# Patient Record
Sex: Female | Born: 1955
Health system: Southern US, Community
[De-identification: ages and names within clinical notes are randomized; demographics above are authoritative.]

## PROBLEM LIST (undated history)

## (undated) DIAGNOSIS — D649 Anemia, unspecified: Secondary | ICD-10-CM

## (undated) DIAGNOSIS — R51 Headache: Secondary | ICD-10-CM

## (undated) DIAGNOSIS — Z87442 Personal history of urinary calculi: Secondary | ICD-10-CM

## (undated) DIAGNOSIS — Z9889 Other specified postprocedural states: Secondary | ICD-10-CM

## (undated) DIAGNOSIS — J45909 Unspecified asthma, uncomplicated: Secondary | ICD-10-CM

## (undated) DIAGNOSIS — M1990 Unspecified osteoarthritis, unspecified site: Secondary | ICD-10-CM

## (undated) DIAGNOSIS — E039 Hypothyroidism, unspecified: Secondary | ICD-10-CM

## (undated) DIAGNOSIS — J189 Pneumonia, unspecified organism: Secondary | ICD-10-CM

## (undated) DIAGNOSIS — R112 Nausea with vomiting, unspecified: Secondary | ICD-10-CM

## (undated) DIAGNOSIS — R635 Abnormal weight gain: Secondary | ICD-10-CM

## (undated) HISTORY — DX: Hypothyroidism, unspecified: E03.9

## (undated) HISTORY — PX: ABDOMINAL HYSTERECTOMY: SHX81

## (undated) HISTORY — PX: TONSILLECTOMY: SHX5217

## (undated) HISTORY — DX: Abnormal weight gain: R63.5

## (undated) HISTORY — PX: DIAGNOSTIC LAPAROSCOPY: SUR761

## (undated) HISTORY — PX: APPENDECTOMY: SHX54

## (undated) HISTORY — PX: TYMPANOSTOMY TUBE PLACEMENT: SHX32

## (undated) HISTORY — PX: KNEE ARTHROSCOPY: SUR90

## (undated) HISTORY — PX: TUBAL LIGATION: SHX77

## (undated) HISTORY — PX: OTHER SURGICAL HISTORY: SHX169

---

## 2000-04-05 ENCOUNTER — Encounter: Admission: RE | Admit: 2000-04-05 | Discharge: 2000-04-05 | Payer: Self-pay | Admitting: Internal Medicine

## 2000-04-05 ENCOUNTER — Encounter: Payer: Self-pay | Admitting: Internal Medicine

## 2005-01-22 ENCOUNTER — Ambulatory Visit: Payer: Self-pay | Admitting: Internal Medicine

## 2005-11-12 HISTORY — PX: FOOT SURGERY: SHX648

## 2006-03-29 ENCOUNTER — Ambulatory Visit: Payer: Self-pay | Admitting: Internal Medicine

## 2006-04-19 ENCOUNTER — Ambulatory Visit (HOSPITAL_BASED_OUTPATIENT_CLINIC_OR_DEPARTMENT_OTHER): Admission: RE | Admit: 2006-04-19 | Discharge: 2006-04-19 | Payer: Self-pay | Admitting: Urology

## 2006-07-22 ENCOUNTER — Ambulatory Visit: Payer: Self-pay | Admitting: Internal Medicine

## 2007-02-11 ENCOUNTER — Ambulatory Visit: Payer: Self-pay | Admitting: Internal Medicine

## 2007-05-06 ENCOUNTER — Ambulatory Visit: Payer: Self-pay | Admitting: Internal Medicine

## 2007-05-27 DIAGNOSIS — E039 Hypothyroidism, unspecified: Secondary | ICD-10-CM | POA: Insufficient documentation

## 2007-05-27 HISTORY — DX: Hypothyroidism, unspecified: E03.9

## 2007-08-14 ENCOUNTER — Telehealth: Payer: Self-pay | Admitting: Internal Medicine

## 2007-11-05 ENCOUNTER — Ambulatory Visit (HOSPITAL_COMMUNITY): Admission: RE | Admit: 2007-11-05 | Discharge: 2007-11-05 | Payer: Self-pay | Admitting: Orthopedic Surgery

## 2007-11-05 ENCOUNTER — Encounter (INDEPENDENT_AMBULATORY_CARE_PROVIDER_SITE_OTHER): Payer: Self-pay | Admitting: Orthopedic Surgery

## 2008-01-07 ENCOUNTER — Telehealth: Payer: Self-pay | Admitting: Internal Medicine

## 2008-11-03 ENCOUNTER — Ambulatory Visit: Payer: Self-pay | Admitting: Family Medicine

## 2008-11-03 ENCOUNTER — Telehealth (INDEPENDENT_AMBULATORY_CARE_PROVIDER_SITE_OTHER): Payer: Self-pay | Admitting: *Deleted

## 2008-11-03 DIAGNOSIS — H6690 Otitis media, unspecified, unspecified ear: Secondary | ICD-10-CM | POA: Insufficient documentation

## 2008-11-16 ENCOUNTER — Ambulatory Visit: Payer: Self-pay | Admitting: Internal Medicine

## 2009-07-01 ENCOUNTER — Telehealth (INDEPENDENT_AMBULATORY_CARE_PROVIDER_SITE_OTHER): Payer: Self-pay

## 2009-07-04 ENCOUNTER — Telehealth: Payer: Self-pay | Admitting: Internal Medicine

## 2009-07-13 ENCOUNTER — Encounter: Payer: Self-pay | Admitting: Internal Medicine

## 2009-07-14 ENCOUNTER — Encounter: Payer: Self-pay | Admitting: Internal Medicine

## 2009-07-15 ENCOUNTER — Ambulatory Visit: Payer: Self-pay | Admitting: Internal Medicine

## 2009-07-15 DIAGNOSIS — R635 Abnormal weight gain: Secondary | ICD-10-CM | POA: Insufficient documentation

## 2009-07-15 HISTORY — DX: Abnormal weight gain: R63.5

## 2009-08-11 ENCOUNTER — Ambulatory Visit: Payer: Self-pay | Admitting: Internal Medicine

## 2009-08-11 DIAGNOSIS — J069 Acute upper respiratory infection, unspecified: Secondary | ICD-10-CM | POA: Insufficient documentation

## 2009-11-29 ENCOUNTER — Ambulatory Visit: Payer: Self-pay | Admitting: Internal Medicine

## 2009-11-29 LAB — CONVERTED CEMR LAB: Rapid Strep: NEGATIVE

## 2010-01-23 ENCOUNTER — Telehealth: Payer: Self-pay | Admitting: Internal Medicine

## 2010-01-24 ENCOUNTER — Ambulatory Visit: Payer: Self-pay | Admitting: Internal Medicine

## 2010-02-09 ENCOUNTER — Ambulatory Visit: Payer: Self-pay | Admitting: Internal Medicine

## 2010-07-12 ENCOUNTER — Ambulatory Visit: Payer: Self-pay | Admitting: Internal Medicine

## 2010-07-14 ENCOUNTER — Telehealth: Payer: Self-pay | Admitting: Internal Medicine

## 2010-10-26 ENCOUNTER — Telehealth: Payer: Self-pay | Admitting: Internal Medicine

## 2010-12-12 NOTE — Assessment & Plan Note (Signed)
Summary: renew meds  per phone note//ccm   Vital Signs:  Patient profile:   55 year old female Weight:      153 pounds Temp:     98.8 degrees F oral BP sitting:   132 / 72  (right arm) Cuff size:   regular  Vitals Entered By: Duard Brady LPN (January 24, 2010 4:07 PM) CC: medication review Is Patient Diabetic? No   CC:  medication review.  History of Present Illness: 55 year old patient who is seen today for follow-up of her hypertension and hypothyroidism.  Her Synthroid dose was adjusted last fall due to slightly elevated TSH.  She has been on chronic phentermine for a number of years and a recent medication refill request was denied.  The risks and benefits of long-term phentermine use.  Discussed at length.  She does give her some frequent drug holidays.  She is aware that this medication is not designed for chronic use but feels that it does offer her considerable benefit with Weight maintenance.  Preventive Screening-Counseling & Management  Alcohol-Tobacco     Smoking Status: never  Allergies: 1)  ! Asa 2)  ! Pcn 3)  ! Sulfa 4)  ! Erythromycin 5)  ! * Latex 6)  ! Cipro 7)  ! * Fish  Past History:  Past Medical History: Reviewed history from 05/27/2007 and no changes required. DJD Hypothyroidism  Physical Exam  General:  Well-developed,well-nourished,in no acute distress; alert,appropriate and cooperative throughout examination; blood pressure well controlled Ears:  External ear exam shows no significant lesions or deformities.  Otoscopic examination reveals clear canals, tympanic membranes are intact bilaterally without bulging, retraction, inflammation or discharge. Hearing is grossly normal bilaterally. modest amount of cerumen in both canals Mouth:  Oral mucosa and oropharynx without lesions or exudates.  Teeth in good repair. Neck:  No deformities, masses, or tenderness noted. Lungs:  Normal respiratory effort, chest expands symmetrically. Lungs are  clear to auscultation, no crackles or wheezes. Heart:  Normal rate and regular rhythm. S1 and S2 normal without gallop, murmur, click, rub or other extra sounds. Abdomen:  Bowel sounds positive,abdomen soft and non-tender without masses, organomegaly or hernias noted. Msk:  No deformity or scoliosis noted of thoracic or lumbar spine.     Impression & Recommendations:  Problem # 1:  WEIGHT GAIN (ICD-783.1)  Problem # 2:  HYPOTHYROIDISM (ICD-244.9)  Her updated medication list for this problem includes:    Synthroid 150 Mcg Tabs (Levothyroxine sodium) ..... One daily  Complete Medication List: 1)  Phentermine Hcl 30 Mg Caps (Phentermine hcl) .Marland Kitchen.. 1 once daily 2)  Cortisporin 3.5-10000-1 Soln (Neomycin-polymyxin-hc) .... 5 drops in ear 4 times a day as needed 3)  Atenolol 25 Mg Tabs (Atenolol) .Marland Kitchen.. 1 once daily as needed headache 4)  Synthroid 150 Mcg Tabs (Levothyroxine sodium) .... One daily 5)  Hydrocodone-acetaminophen 5-500 Mg Tabs (Hydrocodone-acetaminophen) .... One every 4 to 6 hours as needed for pain or cough 6)  Cephalexin 500 Mg Caps (Cephalexin) .... One capsule 3 times daily  Patient Instructions: 1)  Please schedule a follow-up appointment in 6 months. 2)  It is important that you exercise regularly at least 20 minutes 5 times a week. If you develop chest pain, have severe difficulty breathing, or feel very tired , stop exercising immediately and seek medical attention. Prescriptions: SYNTHROID 150 MCG TABS (LEVOTHYROXINE SODIUM) one daily  #90 x 6   Entered and Authorized by:   Gordy Savers  MD   Signed by:  Gordy Savers  MD on 01/24/2010   Method used:   Print then Give to Patient   RxID:   [id number] ATENOLOL 25 MG TABS (ATENOLOL) 1 once daily as needed headache  #90 x 6   Entered and Authorized by:   Gordy Savers  MD   Signed by:   Gordy Savers  MD on 01/24/2010   Method used:   Print then Give to Patient   RxID:    [id number] PHENTERMINE HCL 30 MG  CAPS (PHENTERMINE HCL) 1 once daily  #90 x 3   Entered and Authorized by:   Gordy Savers  MD   Signed by:   Gordy Savers  MD on 01/24/2010   Method used:   Print then Give to Patient   RxID:   [id number]

## 2010-12-12 NOTE — Progress Notes (Signed)
Summary: requests keflex  Phone Note Call from Patient Call back at [phone]   Summary of Call: In chest.  Thick green mucus being coughed up from chest.  Low grade fever, goes up night 103-104.  Taking liquids.   Going out of town.  Requests Rx Keflex, one of the antibiotics she's not allergic to.  [place] [phone].  Allergic to Erythromycin, Penicillin, Cipro, can't remember others.  Let her know.    Initial call taken by: Rudy Jew, RN,  July 14, 2010 9:09 AM  Follow-up for Phone Call        keflex 500a  #30 one three times a day  Follow-up by: Gordy Savers  MD,  July 14, 2010 10:04 AM  Additional Follow-up for Phone Call Additional follow up Details #1::        Phone Call Completed Additional Follow-up by: Rudy Jew, RN,  July 14, 2010 10:27 AM    New/Updated Medications: KEFLEX 500 MG CAPS (CEPHALEXIN) One tid Prescriptions: KEFLEX 500 MG CAPS (CEPHALEXIN) One tid  #30 x 0   Entered by:   Rudy Jew, RN   Authorized by:   Gordy Savers  MD   Signed by:   Rudy Jew, RN on 07/14/2010   Method used:   [facility]        [facility] (retail[facility]       [address]       [place], [place]  [address]       Ph: [phone] or [phone]       Fax: [phone]   RxID:   [id number]

## 2010-12-12 NOTE — Assessment & Plan Note (Signed)
Summary: sore throat/ear pain//ccm   Vital Signs:  Patient profile:   55 year old female Weight:      150 pounds Temp:     99.2 degrees F oral BP sitting:   124 / 80  (right arm) Cuff size:   regular  Vitals Entered By: Duard Brady LPN (July 12, 2010 11:16 AM) CC: c/o head and chest congestion , productive cough , (L) ear pain Is Patient Diabetic? No   CC:  c/o head and chest congestion , productive cough , and (L) ear pain.  History of Present Illness: 55 year old patient who presents with a two-day history of head and chest congestion.  He does have low-grade fever sore throat and some mild left ear discomfort.  She has treated hypothyroidism.  Denies any chills.  She does have a prior history of otitis media.  Cough is minimally productive.  Her husband has had a similar illness and is improving  Allergies: 1)  ! Asa 2)  ! Pcn 3)  ! Sulfa 4)  ! Erythromycin 5)  ! * Latex 6)  ! Cipro 7)  ! * Fish  Past History:  Past Medical History: Reviewed history from 05/27/2007 and no changes required. DJD Hypothyroidism  Review of Systems       The patient complains of anorexia, fever, hoarseness, and prolonged cough.  The patient denies weight loss, weight gain, vision loss, decreased hearing, chest pain, syncope, dyspnea on exertion, peripheral edema, headaches, hemoptysis, abdominal pain, melena, hematochezia, severe indigestion/heartburn, hematuria, incontinence, genital sores, muscle weakness, suspicious skin lesions, transient blindness, difficulty walking, depression, unusual weight change, abnormal bleeding, enlarged lymph nodes, angioedema, and breast masses.    Physical Exam  General:  Well-developed,well-nourished,in no acute distress; alert,appropriate and cooperative throughout examination Head:  Normocephalic and atraumatic without obvious abnormalities. No apparent alopecia or balding. Eyes:  No corneal or conjunctival inflammation noted. EOMI. Perrla.  Funduscopic exam benign, without hemorrhages, exudates or papilledema. Vision grossly normal. Ears:  External ear exam shows no significant lesions or deformities.  Otoscopic examination reveals clear canals, tympanic membranes are intact bilaterally without bulging, retraction, inflammation or discharge. Hearing is grossly normal bilaterally. right tympanic membrane not visualized secondary to cerumen Nose:  External nasal examination shows no deformity or inflammation. Nasal mucosa are pink and moist without lesions or exudates. Mouth:  pharyngeal erythema.   Neck:  slightly tender right anterior cervical node Lungs:  Normal respiratory effort, chest expands symmetrically. Lungs are clear to auscultation, no crackles or wheezes. Heart:  Normal rate and regular rhythm. S1 and S2 normal without gallop, murmur, click, rub or other extra sounds.   Impression & Recommendations:  Problem # 1:  PHARYNGITIS-ACUTE (ICD-462)  The following medications were removed from the medication list:    Cephalexin 500 Mg Caps (Cephalexin) ..... One capsule 3 times daily  Problem # 2:  URI (ICD-465.9)  Complete Medication List: 1)  Phentermine Hcl 30 Mg Caps (Phentermine hcl) .Marland Kitchen.. 1 once daily 2)  Cortisporin 3.5-10000-1 Soln (Neomycin-polymyxin-hc) .... 5 drops in ear 4 times a day as needed 3)  Atenolol 25 Mg Tabs (Atenolol) .Marland Kitchen.. 1 once daily as needed headache 4)  Synthroid 150 Mcg Tabs (Levothyroxine sodium) .... One daily 5)  Hydrocodone-acetaminophen 5-500 Mg Tabs (Hydrocodone-acetaminophen) .... One every 4 to 6 hours as needed for pain or cough  Patient Instructions: 1)  Get plenty of rest, drink lots of clear liquids, and use Tylenol or Ibuprofen for fever and comfort. Return in 7-10 days if you're  not better:sooner if you're feeling worse. Prescriptions: HYDROCODONE-ACETAMINOPHEN 5-500 MG TABS (HYDROCODONE-ACETAMINOPHEN) one every 4 to 6 hours as needed for pain or cough  #50 x 1   Entered and  Authorized by:   Gordy Savers  MD   Signed by:   Gordy Savers  MD on 07/12/2010   Method used:   Print then Give to Patient   RxID:   [id number]

## 2010-12-12 NOTE — Assessment & Plan Note (Signed)
Summary: ST/NJR   Vital Signs:  Patient profile:   55 year old female Weight:      150 pounds Temp:     99.4 degrees F oral BP sitting:   130 / 102  (left arm) Cuff size:   regular  Vitals Entered By: Raechel Ache, RN (November 29, 2009 4:25 PM) CC: C/o sore throat   CC:  C/o sore throat.  History of Present Illness: a 55 year old patient, a 4-day history of sore throat.  She has  also had pain involving the left ear and a small amount of bloody drainage noted.  Other complaints include fever, weakness, and mild cough.  She has multiple antibiotic allergies including penicillin, but she states that she can take cephalexin.  She has treated hypothyroidism  Allergies: 1)  ! Asa 2)  ! Pcn 3)  ! Sulfa 4)  ! Erythromycin 5)  ! * Latex 6)  ! Cipro  Past History:  Past Medical History: Reviewed history from 05/27/2007 and no changes required. DJD Hypothyroidism  Review of Systems       The patient complains of fever and prolonged cough.  The patient denies anorexia, weight loss, weight gain, vision loss, decreased hearing, hoarseness, chest pain, syncope, dyspnea on exertion, peripheral edema, headaches, hemoptysis, abdominal pain, melena, hematochezia, severe indigestion/heartburn, hematuria, incontinence, genital sores, muscle weakness, suspicious skin lesions, transient blindness, difficulty walking, depression, unusual weight change, abnormal bleeding, enlarged lymph nodes, angioedema, and breast masses.    Physical Exam  General:  Well-developed,well-nourished,in no acute distress; alert,appropriate and cooperative throughout examination Head:  Normocephalic and atraumatic without obvious abnormalities. No apparent alopecia or balding. Eyes:  No corneal or conjunctival inflammation noted. EOMI. Perrla. Funduscopic exam benign, without hemorrhages, exudates or papilledema. Vision grossly normal. Ears:  left tympanic membrane was inflamed Mouth:  pharyngeal erythema.     Neck:  bilateral mild tender cervical adenopathy.  The right greater than the left Lungs:  Normal respiratory effort, chest expands symmetrically. Lungs are clear to auscultation, no crackles or wheezes. Heart:  Normal rate and regular rhythm. S1 and S2 normal without gallop, murmur, click, rub or other extra sounds.   Impression & Recommendations:  Problem # 1:  OTITIS MEDIA (ICD-382.9)  Her updated medication list for this problem includes:    Cephalexin 500 Mg Caps (Cephalexin) ..... One capsule 3 times daily  Problem # 2:  HYPOTHYROIDISM (ICD-244.9)  Her updated medication list for this problem includes:    Synthroid 150 Mcg Tabs (Levothyroxine sodium) ..... One daily  Complete Medication List: 1)  Phentermine Hcl 30 Mg Caps (Phentermine hcl) .Marland Kitchen.. 1 once daily 2)  Cortisporin 3.5-10000-1 Soln (Neomycin-polymyxin-hc) .... 5 drops in ear 4 times a day as needed 3)  Atenolol 25 Mg Tabs (Atenolol) .Marland Kitchen.. 1 once daily as needed headache 4)  Synthroid 150 Mcg Tabs (Levothyroxine sodium) .... One daily 5)  Hydrocodone-acetaminophen 5-500 Mg Tabs (Hydrocodone-acetaminophen) .... One every 4 to 6 hours as needed for pain or cough 6)  Cephalexin 500 Mg Caps (Cephalexin) .... One capsule 3 times daily  Other Orders: Rapid Strep ([id number])  Patient Instructions: 1)  Please schedule a follow-up appointment as needed. 2)  Take your antibiotic as prescribed until ALL of it is gone, but stop if you develop a rash or swelling and contact our office as soon as possible. Prescriptions: CEPHALEXIN 500 MG CAPS (CEPHALEXIN) one capsule 3 times daily  #30 x 0   Entered and Authorized by:   Gordy Savers  MD   Signed by:   Gordy Savers  MD on 11/29/2009   Method used:   Print then Give to Patient   RxID:   [id number] HYDROCODONE-ACETAMINOPHEN 5-500 MG TABS (HYDROCODONE-ACETAMINOPHEN) one every 4 to 6 hours as needed for pain or cough  #50 x 0   Entered and Authorized by:   Gordy Savers  MD   Signed by:   Gordy Savers  MD on 11/29/2009   Method used:   Print then Give to Patient   RxID:   [id number] CORTISPORIN 3.5-10000-1 SOLN (NEOMYCIN-POLYMYXIN-HC) 5 drops in ear 4 times a day as needed  #10 ml x 5   Entered and Authorized by:   Gordy Savers  MD   Signed by:   Gordy Savers  MD on 11/29/2009   Method used:   Print then Give to Patient   RxID:   [id number]   Laboratory Results    Other Tests  Rapid Strep: negative Comments: Rita Ohara  November 29, 2009 4:48 PM   Kit Test Internal QC: Negative   (Normal Range: Negative)

## 2010-12-12 NOTE — Assessment & Plan Note (Signed)
Summary: ? welps//ccm   Vital Signs:  Patient profile:   55 year old female Weight:      154 pounds Temp:     98.2 degrees F BP sitting:   140 / 90  (right arm) Cuff size:   regular  Vitals Entered By: Duard Brady LPN (February 09, 2010 9:08 AM) CC: c/o 'whelps in scalp' knots , sore throat and headache Is Patient Diabetic? No   CC:  c/o 'whelps in scalp' knots  and sore throat and headache.  History of Present Illness: 55 year old patient with a one-day history of sore throat, headache, and also noticed some painful nodules in the neck and scalp area.  There's been no fever.  She has a history of hypothyroidism.  Denies any sinus pain, drainage, or productive cough.  She has not self treated  the above symptoms.  Allergies: 1)  ! Asa 2)  ! Pcn 3)  ! Sulfa 4)  ! Erythromycin 5)  ! * Latex 6)  ! Cipro 7)  ! * Fish  Past History:  Past Medical History: Reviewed history from 05/27/2007 and no changes required. DJD Hypothyroidism  Family History: Reviewed history from 05/27/2007 and no changes required. Family History Hypertension Family History of Cardiovascular disorder Fam hx Dabetes  Social History: Reviewed history from 05/27/2007 and no changes required. Never Smoked  Review of Systems       The patient complains of anorexia, hoarseness, headaches, and suspicious skin lesions.  The patient denies fever, weight loss, weight gain, vision loss, decreased hearing, chest pain, syncope, dyspnea on exertion, peripheral edema, prolonged cough, hemoptysis, abdominal pain, melena, hematochezia, severe indigestion/heartburn, hematuria, incontinence, genital sores, muscle weakness, transient blindness, difficulty walking, depression, unusual weight change, abnormal bleeding, enlarged lymph nodes, angioedema, and breast masses.    Physical Exam  General:  Well-developed,well-nourished,in no acute distress; alert,appropriate and cooperative throughout examination; for  pressure 140/90 Head:  Normocephalic and atraumatic without obvious abnormalities. No apparent alopecia or balding. Eyes:  No corneal or conjunctival inflammation noted. EOMI. Perrla. Funduscopic exam benign, without hemorrhages, exudates or papilledema. Vision grossly normal. Ears:  External ear exam shows no significant lesions or deformities.  Otoscopic examination reveals clear canals, tympanic membranes are intact bilaterally without bulging, retraction, inflammation or discharge. Hearing is grossly normal bilaterally. Mouth:  pharyngeal erythema.   Neck:  mild adenopathy, right subclavicular, and left posterior cervical region Lungs:  Normal respiratory effort, chest expands symmetrically. Lungs are clear to auscultation, no crackles or wheezes. Heart:  Normal rate and regular rhythm. S1 and S2 normal without gallop, murmur, click, rub or other extra sounds. Skin:  two very small subcutaneous nodules 4 to 5 mm in diameter in the posterior scalp region   Impression & Recommendations:  Problem # 1:  PHARYNGITIS-ACUTE (ICD-462)  Her updated medication list for this problem includes:    Cephalexin 500 Mg Caps (Cephalexin) ..... One capsule 3 times daily  Problem # 2:  HYPOTHYROIDISM (ICD-244.9)  Her updated medication list for this problem includes:    Synthroid 150 Mcg Tabs (Levothyroxine sodium) ..... One daily  Complete Medication List: 1)  Phentermine Hcl 30 Mg Caps (Phentermine hcl) .Marland Kitchen.. 1 once daily 2)  Cortisporin 3.5-10000-1 Soln (Neomycin-polymyxin-hc) .... 5 drops in ear 4 times a day as needed 3)  Atenolol 25 Mg Tabs (Atenolol) .Marland Kitchen.. 1 once daily as needed headache 4)  Synthroid 150 Mcg Tabs (Levothyroxine sodium) .... One daily 5)  Hydrocodone-acetaminophen 5-500 Mg Tabs (Hydrocodone-acetaminophen) .... One every 4 to 6  hours as needed for pain or cough 6)  Cephalexin 500 Mg Caps (Cephalexin) .... One capsule 3 times daily  Patient Instructions: 1)  Zipsor one four times  daily 2)  clarinex-one daily 3)  Please schedule a follow-up appointment as needed. Prescriptions: HYDROCODONE-ACETAMINOPHEN 5-500 MG TABS (HYDROCODONE-ACETAMINOPHEN) one every 4 to 6 hours as needed for pain or cough  #50 x 1   Entered and Authorized by:   Gordy Savers  MD   Signed by:   Gordy Savers  MD on 02/09/2010   Method used:   Print then Give to Patient   RxID:   [id number]

## 2010-12-12 NOTE — Progress Notes (Signed)
Summary: REFILL denied  Phone Note Refill Request Message from:  Fax from Pharmacy  Refills Requested: Medication #1:  PHENTERMINE HCL 30 MG  CAPS 1 once daily [facility] [phone]     FAX---[phone]  Initial call taken by: Warnell Forester,  January 23, 2010 10:43 AM  Follow-up for Phone Call        called pharm - denied per Dr. Amador Cunas - would need to be seen , does not recommend for chronic weight loss. Follow-up by: Duard Brady LPN,  January 23, 2010 11:18 AM    Would not recommend ongoing chronic use of this medication

## 2010-12-14 NOTE — Progress Notes (Signed)
Summary: refill phentermine  Phone Note Refill Request   Refills Requested: Medication #1:  PHENTERMINE HCL 30 MG  CAPS 1 once daily   [facility]: [facility] [address]   Method Requested: Fax to Local Pharmacy Initial call taken by: Duard Brady LPN,  October 26, 2010 2:15 PM    Prescriptions: PHENTERMINE HCL 30 MG  CAPS (PHENTERMINE HCL) 1 once daily  #90 x 3   Entered by:   Duard Brady LPN   Authorized by:   Gordy Savers  MD   Signed by:   Duard Brady LPN on 04/54/0981   Method used:   Historical   RxID:   [id number]  [facility]    KIK

## 2011-03-27 NOTE — Op Note (Signed)
Katelyn [name], Katelyn [name]               ACCOUNT NO.:  [id number]   MEDICAL RECORD NO.:  [id number]          PATIENT TYPE:  AMB   LOCATION:  DAY                          FACILITY:  [facility]   PHYSICIAN:  Ronald A. Gioffre, M.D.DATE OF BIRTH:  August 27, 1956   DATE OF PROCEDURE:  11/05/2007  DATE OF DISCHARGE:                               OPERATIVE REPORT   SURGEON:  Dr. Darrelyn Hillock.   ASSISTANT:  Nurse.   PREOPERATIVE DIAGNOSES:  Mass over the plantar aspect of the right foot.   POSTOPERATIVE DIAGNOSES:  Mass over the plantar aspect of the right  foot.   OPERATION:  Partial plantar fasciectomy, right foot.   PROCEDURE:  Under general anesthesia, a routine orthopedic prep and  draping of the right foot was carried out.  She had 1 gram of IV Ancef.  At this time, an incision was made directly over the palpable mass of  the right foot, bleeders identified and cauterized.  No tourniquet was  used.  I simply utilized the Esmarch as a minimal tourniquet effect.  At  this time, I went right down and identified the mass which was located  within the plantar fascia. I inserted self-retaining retractors and did  a partial plantar fasciectomy.  I then palpated with a gloved finger to  make sure that there were no other masses noted or abnormalities and  there were none.  I thoroughly irrigated out the area, closed the subcu  with #0 Vicryl, the skin was closed with 3-0 nylon and a plain sterile  dressing was applied.  Note all the precautions were taken because she  was allergic to LATEX and NITRILE. We used latex and nitrile-free gloves  to do the procedure. The specimen was sent to pathology.           ______________________________  Georges Lynch Darrelyn Hillock, M.D.     RAG/MEDQ  D:  11/05/2007  T:  11/05/2007  Job:  [id number]

## 2011-03-30 NOTE — Op Note (Signed)
[name], Katelyn [name]               ACCOUNT NO.:  [id number]   MEDICAL RECORD NO.:  [id number]          PATIENT TYPE:  AMB   LOCATION:  [facility]                         FACILITY:  [facility]   PHYSICIAN:  Excell Seltzer. Annabell Howells, M.D.    DATE OF BIRTH:  1955/11/27   DATE OF PROCEDURE:  04/19/2006  DATE OF DISCHARGE:                                 OPERATIVE REPORT   PROCEDURE:  Cystoscopy, right retrograde pyelogram with interpretation,  right ureteroscopic stone extraction.  Insertion of right double-J stent.   PREOPERATIVE DIAGNOSIS:  Right mid ureteral stone.   POSTOPERATIVE DIAGNOSIS:  Right distal ureteral stone.   SURGEON:  Dr. Bjorn Pippin.   ANESTHESIA:  General.   SPECIMENS:  Stone fragments.   DRAIN:  6-French x 24 cm contour double-J stent on the right.   COMPLICATIONS:  None.   INDICATIONS:  [name] is a 55 year old white female with a stone in the  right mid ureter who is to undergo ureteroscopic stone extraction for  persistent symptoms.   FINDINGS AND PROCEDURE:  The patient is taken to operating room after  receiving IV Cipro.  The Cipro infusion had to be stopped because of some  erythema and nausea.  A general anesthetic was induced.  She was placed in  lithotomy position.  Her perineum and genitalia were prepped with Betadine  solution.  She was draped in usual sterile fashion.  Cystoscopy was  performed usual fashion with a 16-French flexible scope after routine prep.  Of note latex and nitrile precautions were taken because of allergies.   Cystoscopy was performed using a 22-French scope and a 12 and 70 degree  lenses.  Examination revealed a normal urethra.  The bladder wall was smooth  and pale without tumor, stones or inflammation.  Ureteral orifices were  unremarkable.   The right ureteral orifice was cannulated with a 5-French open end catheter  and contrast was instilled in retrograde fashion.   The retrograde pyelogram demonstrated filling defect in the upper  portion  the distal ureter on the right consistent with her stone.  There is some  dilation proximal to the stone.   After retrograde pyelogram was performed, a guidewire was passed to the  kidney and a dilating inner cannula of a 12/14 access sheath was passed over  the wire by the stone.   The 6-French short ureteroscope was then passed alongside the wire to the  stone.  The stone was visualized, grasped with a nitinol basket and removed.  A residual fragment was then retrieved as well.  Reinspection revealed no  residual stone fragments.  There was some inflammatory changes at the site  of the stone with proximal dilation.   At this point the cystoscope was reinserted over the wire and a 6-French 24  cm Contour double-J stent was passed up to the right kidney under  fluoroscopic guidance.  The wire was removed leaving good coil in the kidney  and good coil in the bladder.  The bladder was drained.  The cystoscope was  removed leaving the stent string exiting urethra.  The string was  tied close  to the urethra and trimmed.  The patient was taken down from lithotomy  position.  Her anesthetic was reversed.  She was removed to the recovery  room in stable condition.  There were no complications apart from the local  irritation from the Cipro.      Excell Seltzer. Annabell Howells, M.D.  Electronically Signed     JJW/MEDQ  D:  04/19/2006  T:  04/20/2006  Job:  [id number]   cc:   Gordy Savers, M.D. [facility]  [address] [place]  [place] [address]

## 2011-06-01 ENCOUNTER — Telehealth: Payer: Self-pay

## 2011-06-01 NOTE — Telephone Encounter (Signed)
Fax refill request from [facility] for phenterimine - last seen 06/2010 , last written 10/2010 #[phone] - speaking with pharmacy- can only be rx'd for 6 months - rx expired - gave ok #[phone]

## 2011-08-17 LAB — URINALYSIS, ROUTINE W REFLEX MICROSCOPIC
Glucose, UA: NEGATIVE
Hgb urine dipstick: NEGATIVE
Leukocytes, UA: NEGATIVE
Protein, ur: NEGATIVE
pH: 7.5

## 2011-08-17 LAB — PROTIME-INR: Prothrombin Time: 13.1

## 2011-08-17 LAB — URINE MICROSCOPIC-ADD ON

## 2011-08-17 LAB — DIFFERENTIAL
Basophils Absolute: 0
Eosinophils Absolute: 0.1 — ABNORMAL LOW
Eosinophils Relative: 2
Lymphs Abs: 2.7
Monocytes Absolute: 0.6

## 2011-08-17 LAB — COMPREHENSIVE METABOLIC PANEL
ALT: 14
AST: 17
Albumin: 3.7
CO2: 31
Chloride: 105
GFR calc Af Amer: >60
GFR calc non Af Amer: >60
Potassium: 3.9
Sodium: 141
Total Bilirubin: 0.8

## 2011-08-17 LAB — CBC
Platelets: 318
RBC: 4.39
WBC: 7.3

## 2011-08-30 ENCOUNTER — Emergency Department (HOSPITAL_COMMUNITY)
Admission: EM | Admit: 2011-08-30 | Discharge: 2011-08-31 | Disposition: A | Discharge status: Home / Self Care | Payer: 59 | Attending: Emergency Medicine | Admitting: Emergency Medicine

## 2011-08-30 ENCOUNTER — Emergency Department (HOSPITAL_COMMUNITY): Payer: 59

## 2011-08-30 DIAGNOSIS — N201 Calculus of ureter: Secondary | ICD-10-CM | POA: Insufficient documentation

## 2011-08-30 DIAGNOSIS — N83209 Unspecified ovarian cyst, unspecified side: Secondary | ICD-10-CM | POA: Insufficient documentation

## 2011-08-30 DIAGNOSIS — N1330 Unspecified hydronephrosis: Secondary | ICD-10-CM | POA: Insufficient documentation

## 2011-08-30 DIAGNOSIS — Z79899 Other long term (current) drug therapy: Secondary | ICD-10-CM | POA: Insufficient documentation

## 2011-08-30 DIAGNOSIS — R911 Solitary pulmonary nodule: Secondary | ICD-10-CM | POA: Insufficient documentation

## 2011-08-30 LAB — DIFFERENTIAL
Basophils Absolute: 0 10*3/uL (ref 0.0–0.1)
Eosinophils Relative: 0 % (ref 0–5)
Lymphocytes Relative: 13 % (ref 12–46)
Lymphs Abs: 1.7 10*3/uL (ref 0.7–4.0)
Neutro Abs: 10.5 10*3/uL — ABNORMAL HIGH (ref 1.7–7.7)

## 2011-08-30 LAB — BASIC METABOLIC PANEL
CO2: 26 mEq/L (ref 19–32)
Calcium: 9.2 mg/dL (ref 8.4–10.5)
Creatinine, Ser: 0.88 mg/dL (ref 0.50–1.10)
GFR calc non Af Amer: 73 mL/min — ABNORMAL LOW (ref >90)
Sodium: 138 mEq/L (ref 135–145)

## 2011-08-30 LAB — URINALYSIS, ROUTINE W REFLEX MICROSCOPIC
Bilirubin Urine: NEGATIVE
Glucose, UA: NEGATIVE mg/dL
Specific Gravity, Urine: 1.022 (ref 1.005–1.030)
Urobilinogen, UA: 0.2 mg/dL (ref 0.0–1.0)
pH: 7 (ref 5.0–8.0)

## 2011-08-30 LAB — URINE MICROSCOPIC-ADD ON

## 2011-08-30 LAB — CBC
HCT: 40.6 % (ref 36.0–46.0)
Hemoglobin: 13.9 g/dL (ref 12.0–15.0)
MCV: 85.7 fL (ref 78.0–100.0)
RBC: 4.74 MIL/uL (ref 3.87–5.11)
WBC: 13.1 10*3/uL — ABNORMAL HIGH (ref 4.0–10.5)

## 2011-09-05 ENCOUNTER — Ambulatory Visit (HOSPITAL_BASED_OUTPATIENT_CLINIC_OR_DEPARTMENT_OTHER)
Admission: RE | Admit: 2011-09-05 | Discharge: 2011-09-05 | Disposition: A | Discharge status: Home / Self Care | Payer: 59 | Source: Ambulatory Visit | Attending: Urology | Admitting: Urology

## 2011-09-05 DIAGNOSIS — E039 Hypothyroidism, unspecified: Secondary | ICD-10-CM | POA: Insufficient documentation

## 2011-09-05 DIAGNOSIS — Z01812 Encounter for preprocedural laboratory examination: Secondary | ICD-10-CM | POA: Insufficient documentation

## 2011-09-05 DIAGNOSIS — N201 Calculus of ureter: Secondary | ICD-10-CM | POA: Insufficient documentation

## 2011-09-05 DIAGNOSIS — Z01810 Encounter for preprocedural cardiovascular examination: Secondary | ICD-10-CM | POA: Insufficient documentation

## 2011-09-05 LAB — POCT I-STAT 4, (NA,K, GLUC, HGB,HCT)
Hemoglobin: 13.9 g/dL (ref 12.0–15.0)
Potassium: 3.8 mEq/L (ref 3.5–5.1)
Sodium: 138 mEq/L (ref 135–145)

## 2011-09-10 NOTE — Op Note (Signed)
  NAMEARTIS, BUECHELE NO.:  [id number]  MEDICAL RECORD NO.:  [id number]  LOCATION:                               FACILITY:  [facility]  PHYSICIAN:  Excell Seltzer. Annabell Howells, M.D.    DATE OF BIRTH:  03-03-1956  DATE OF PROCEDURE:  09/05/2011 DATE OF DISCHARGE:                              OPERATIVE REPORT   PROCEDURE: 1. Cystoscopy. 2. Left retrograde pyelogram. 3. Left ureteroscopic stone extraction.  PREOPERATIVE DIAGNOSIS:  Left distal stone.  POSTOPERATIVE DIAGNOSIS:  Left distal stone.  SURGEON:  Excell Seltzer. Annabell Howells, M.D.  ANESTHESIA:  General.  SPECIMEN:  Stone, which was lost in drapes.  DRAINS:  None.  COMPLICATIONS:  None.  INDICATIONS:  Ms. Katelyn [name] is a 55 year old white female, who presented with left flank pain and was found to have a left distal ureteral stone. Her symptoms have continued.  Her stone was not readily seen on KUB in the office yesterday,  but on CT, it was at the level where it would be obscured by the pubic bone and with her persistent symptoms, she is elected to proceed with ureteroscopic stone extraction.  FINDINGS PROCEDURE:  She was taken to the operating room where she was given a g of Ancef.  General anesthetic was induced.  She was placed in lithotomy position.  Her perineum and genitalia were prepped with Betadine solution.  She was draped in the usual sterile fashion.  Time- out was performed.  Cysto was performed using a 22-French scope and 12 degree lens.  Examination revealed a normal urethra.  The bladder wall was smooth and pale without tumor, stone, or inflammation.  Ureteral orifices were unremarkable.  The left ureteral orifice was cannulated with a 5-French open-end catheter and contrast instilled.  There is a little irregularity of the distal ureter, but a filling defect was not readily apparent.  There was minimal proximal dilation.  At this point, a guidewire was passed to the kidney and the cystoscope was removed.   A 12-French dilator was passed over the wire approximately 3 to 4 cm up the inner intramural ureter.  A 6-French short ureteroscope was then inserted alongside the wire. Inspection revealed no obvious stones within the ureter up to the iliacs and it was felt that the stone probably flushed out after dilation.  At this point, further inspection of bladder did demonstrate a very small stone in the base of the bladder.  The cystoscope was replaced.  The stone was flushed from the bladder and was noted in the drapes, but during an attempt to retrieve it, it was lost and it was no bigger than a grain of sand, which was really consistent with a stone seen on her CT films.  At this point, the guidewire was removed.  She was taken down from the lithotomy position.  Her anesthetic was reversed.  She was moved to recovery room in stable condition.  There were no complications.     Excell Seltzer. Annabell Howells, M.D.     JJW/MEDQ  D:  09/05/2011  T:  09/06/2011  Job:  [id number]  Electronically Signed by Bjorn Pippin M.D. on 09/10/2011 08:08:29 AM

## 2011-09-28 ENCOUNTER — Other Ambulatory Visit: Payer: Self-pay | Admitting: Obstetrics and Gynecology

## 2011-10-09 ENCOUNTER — Encounter: Payer: Self-pay | Admitting: Internal Medicine

## 2011-10-18 ENCOUNTER — Ambulatory Visit (INDEPENDENT_AMBULATORY_CARE_PROVIDER_SITE_OTHER): Payer: 59 | Admitting: Internal Medicine

## 2011-10-18 ENCOUNTER — Encounter: Payer: Self-pay | Admitting: Internal Medicine

## 2011-10-18 DIAGNOSIS — E039 Hypothyroidism, unspecified: Secondary | ICD-10-CM

## 2011-10-18 DIAGNOSIS — N839 Noninflammatory disorder of ovary, fallopian tube and broad ligament, unspecified: Secondary | ICD-10-CM

## 2011-10-18 DIAGNOSIS — I10 Essential (primary) hypertension: Secondary | ICD-10-CM | POA: Insufficient documentation

## 2011-10-18 DIAGNOSIS — N200 Calculus of kidney: Secondary | ICD-10-CM | POA: Insufficient documentation

## 2011-10-18 DIAGNOSIS — R911 Solitary pulmonary nodule: Secondary | ICD-10-CM | POA: Insufficient documentation

## 2011-10-18 DIAGNOSIS — J984 Other disorders of lung: Secondary | ICD-10-CM

## 2011-10-18 DIAGNOSIS — N838 Other noninflammatory disorders of ovary, fallopian tube and broad ligament: Secondary | ICD-10-CM

## 2011-10-18 MED ORDER — ATENOLOL 25 MG PO TABS: 25.0000 mg | ORAL_TABLET | Freq: Every day | ORAL | Status: DC

## 2011-10-18 MED ORDER — LEVOTHYROXINE SODIUM 150 MCG PO TABS: 150.0000 ug | ORAL_TABLET | Freq: Every day | ORAL | Status: DC

## 2011-10-18 MED ORDER — HYDROCODONE-ACETAMINOPHEN 5-500 MG PO TABS: 1.0000 | ORAL_TABLET | Freq: Four times a day (QID) | ORAL | Status: DC | PRN

## 2011-10-18 NOTE — Patient Instructions (Signed)
Limit your sodium (Salt) intake  Please check your blood pressure on a regular basis.  If it is consistently greater than 150/90, please make an office appointment.    It is important that you exercise regularly, at least 20 minutes 3 to 4 times per week.  If you develop chest pain or shortness of breath seek  medical attention.  Return in 3 months for follow-up  

## 2011-10-18 NOTE — Progress Notes (Signed)
  Subjective:    Patient ID: Katelyn [name], female    DOB: 1956-07-24, 55 y.o.   MRN: [id number]  HPI 55 year old patient who is in today for followup of hypertension. This has been controlled on atenolol. She has been out of her medication for several days. She has seen multiple doctors recently with normal blood pressure readings. Blood pressure on arrival here today 140/100. She was diagnosed with renal colic on October 18. This required a urological procedure for left ureteral stones and hydronephrosis with renal colic. CT scan revealed an incidental 5 mm right mid pulmonary nodule and followup CT in 6-12 months recommended. CT scan also revealed a complex right adnexal mass and the patient is being evaluated for possible ovarian neoplasm. Patient is a lifelong nonsmoker    Review of Systems  Constitutional: Negative.   HENT: Negative for hearing loss, congestion, sore throat, rhinorrhea, dental problem, sinus pressure and tinnitus.   Eyes: Negative for pain, discharge and visual disturbance.  Respiratory: Negative for cough and shortness of breath.   Cardiovascular: Negative for chest pain, palpitations and leg swelling.  Gastrointestinal: Negative for nausea, vomiting, abdominal pain, diarrhea, constipation, blood in stool and abdominal distention.  Genitourinary: Negative for dysuria, urgency, frequency, hematuria, flank pain, vaginal bleeding, vaginal discharge, difficulty urinating, vaginal pain and pelvic pain.  Musculoskeletal: Negative for joint swelling, arthralgias and gait problem.  Skin: Negative for rash.  Neurological: Negative for dizziness, syncope, speech difficulty, weakness, numbness and headaches.  Hematological: Negative for adenopathy.  Psychiatric/Behavioral: Negative for behavioral problems, dysphoric mood and agitation. The patient is not nervous/anxious.        Objective:   Physical Exam  Constitutional: She is oriented to person, place, and time. She  appears well-developed and well-nourished.       Blood pressure 140/90  HENT:  Head: Normocephalic.  Right Ear: External ear normal.  Left Ear: External ear normal.  Mouth/Throat: Oropharynx is clear and moist.  Eyes: Conjunctivae and EOM are normal. Pupils are equal, round, and reactive to light.  Neck: Normal range of motion. Neck supple. No thyromegaly present.  Cardiovascular: Normal rate, regular rhythm, normal heart sounds and intact distal pulses.   Pulmonary/Chest: Effort normal and breath sounds normal.  Abdominal: Soft. Bowel sounds are normal. She exhibits no mass. There is no tenderness.  Musculoskeletal: Normal range of motion.  Lymphadenopathy:    She has no cervical adenopathy.  Neurological: She is alert and oriented to person, place, and time.  Skin: Skin is warm and dry. No rash noted.  Psychiatric: She has a normal mood and affect. Her behavior is normal.          Assessment & Plan:   Hypertension. We'll resume atenolol 25 mg daily. Low salt diet exercise discussed;  home blood pressure monitoring encouraged 5 mm right mid pulmonary nodule. We'll followup in 3 months we'll consider followup chest CT at that time. Patient may have ovarian cancer and additional staging procedures may be required Complex right ovarian mass. Evaluation in progress patient to be evaluated by oncologic surgery

## 2011-10-23 ENCOUNTER — Encounter: Payer: Self-pay | Admitting: Gynecologic Oncology

## 2011-10-23 ENCOUNTER — Ambulatory Visit: Payer: 59 | Attending: Gynecologic Oncology | Admitting: Gynecologic Oncology

## 2011-10-23 VITALS — BP 132/64 | HR 78 | Temp 98.4°F | Resp 20 | Ht 66.0 in | Wt 154.2 lb

## 2011-10-23 DIAGNOSIS — N83299 Other ovarian cyst, unspecified side: Secondary | ICD-10-CM

## 2011-10-23 DIAGNOSIS — N839 Noninflammatory disorder of ovary, fallopian tube and broad ligament, unspecified: Secondary | ICD-10-CM | POA: Insufficient documentation

## 2011-10-23 DIAGNOSIS — Z79899 Other long term (current) drug therapy: Secondary | ICD-10-CM | POA: Insufficient documentation

## 2011-10-23 DIAGNOSIS — E039 Hypothyroidism, unspecified: Secondary | ICD-10-CM | POA: Insufficient documentation

## 2011-10-23 DIAGNOSIS — Z90710 Acquired absence of both cervix and uterus: Secondary | ICD-10-CM | POA: Insufficient documentation

## 2011-10-23 NOTE — Progress Notes (Signed)
Consult Note: Gyn-Onc  Consult was requested by Dr. Henderson Cloud for the evaluation of Katelyn [name] 55 y.o. female  CC:  Chief Complaint  Patient presents with  . Ovarian Cyst    Pelvic mass    HPI: This is a 55 year old gravida 2 para 2 last normal menstrual period age of 67 at which time she underwent a vaginal hysterectomy .  She was evaluated in the emergency room for left-sided nephrolithiasis on 08/30/2011.  CT imaging for the evaluation of nephrolithiasis  noted "Complex mass is seen in the right lower pelvis with areas of high and low density. This measures 7.0 x 5.7 cm. This is presumably arising from the right ovary. No or free fluid or free air."  Pelvic UTZ 08/30/2011" Right Ovary: 7.5 x 5.6 x 6.4 cm. Complex cystic mass in the right  ovary measures 5.7 x 5.6 x 5.4 cm. The septations are seen within  the cystic areas with soft tissue components. Some areas are hyperechoic suggesting the possibility of fat or calcifications.  Differential considerations would include a dermoid cyst or a cystic ovarian neoplasm (favored).  Left Ovary: 2.0 x 2.0 x 1.3 cm. 1.8 cm cyst which appears simple."  CA 125 collected 09/29/2011 returned to value of 9 FSH is noted to be 53.5 which is in the menopausal range. The patient denies nausea vomiting constipation weight loss abdominal bloating or any of the traditional stigmata of ovarian cancer. Her screening history notable for mammogram several years ago on colonoscopy 4-5 years ago    Performance status: 0  Review of Systems:  Constitutional  Feels well,  denies anorexia Skin/Breast  No rash, sores, jaundice, itching, dryness Cardiovascular  No chest pain, shortness of breath, or edema  Pulmonary  No cough or wheeze.  Gastro Intestinal  No nausea, vomitting, or constipation No bright red blood per rectum, no abdominal pain, change in bowel movement.  Reports daily diarrhea from her irritable bowel disease.  Genito Urinary  No frequency,  urgency, dysuria, no vaginal bleeding or discharge Musculo Skeletal  No myalgia, arthralgia, joint swelling or pain  Neurologic  No weakness, numbness, change in gait,  Psychology  No depression, anxiety, insomnia.  Allergies Patient reports allergies to latex and nitril gloves. She also reports an allergic reaction to the suture used in her recent foot surgery. Upon review Vicryl and nylon were used. She reports the need to treat incisional inflammation with steroids.  Current Meds:  Outpatient Encounter Prescriptions as of 10/23/2011  Medication Sig Dispense Refill  . atenolol (TENORMIN) 25 MG tablet Take 1 tablet (25 mg total) by mouth daily. As needed for headaches  90 tablet  5  . levothyroxine (SYNTHROID, LEVOTHROID) 150 MCG tablet Take 1 tablet (150 mcg total) by mouth daily.  90 tablet  4  . phentermine 30 MG capsule Take 30 mg by mouth every morning.        Marland Kitchen HYDROcodone-acetaminophen (VICODIN) 5-500 MG per tablet Take 1 tablet by mouth every 6 (six) hours as needed.  60 tablet  2    Allergy:  Allergies  Allergen Reactions  . Gloves Hives and Shortness Of Breath    Nitrile gloves,  . Aspirin   . Ciprofloxacin   . Erythromycin   . Latex   . Penicillins   . Sulfonamide Derivatives     Social Hx:   History   Social History  . Marital Status: Married    Spouse Name: N/A    Number of Children: N/A  .  Years of Education: N/A   Occupational History  . Not on file.   Social History Main Topics  . Smoking status: Never Smoker   . Smokeless tobacco: Never Used  . Alcohol Use: No  . Drug Use: No  . Sexually Active: Yes   Other Topics Concern  . Not on file   Social History Narrative  . No narrative on file    Past Surgical Hx:  Past Surgical History  Procedure Date  . Abdominal hysterectomy   . Appendectomy   . Tonsillectomy   . Tympanostomy tube placement   . Foot surgery 2007    tumor removed from right foot    Past Medical Hx:  Past Medical  History  Diagnosis Date  . HYPOTHYROIDISM 05/27/2007  . WEIGHT GAIN 07/15/2009    Past Gynecological History:  Gravida 2 para 2 hysterectomy the age of 86  Family Hx:  Family History  Problem Relation Age of Onset  . Hypertension Neg Hx     family hx  . Heart disease Neg Hx     family hx  . Alzheimer's disease Mother   . Cervical cancer Mother   . Diabetes Sister   . Heart attack Brother     Vitals:  Blood pressure 132/64, pulse 78, temperature 98.4 F (36.9 C), temperature source Oral, resp. rate 20, height 5\' 6"  (1.676 m), weight 154 lb 3.2 oz (69.945 kg).  Physical Exam: WD in NAD Neck  Supple NROM, without any enlargements.  Lymph Node Survey No cervical supraclavicular or inguinal adenopathy Cardiovascular  Pulse normal rate, regularity and rhythm. S1 and S2 normal.  Lungs  Clear to auscultation bilateraly, without wheezes/crackles/rhonchi. Good air movement.  Skin  No rash/lesions/breakdown  Psychiatry  Alert and oriented to person, place, and time  Abdomen  Normoactive bowel sounds, abdomen soft, non-tender and obese. No omental cake no guarding no fluid wave no palpable masses Back No CVA tenderness Genito Urinary  Vulva/vagina: Normal external female genitalia.  No lesions. No discharge or bleeding.  Bladder/urethra:  No lesions or masses  Vagina: Atrophic without any lesion Cervix: Absent. Uterus: Absent  Adnexa: No palpable masses. No nodularity noted in the cul-de-sac Rectal  Patient declined because of fear of provoking diarrhea. Extremities  No bilateral cyanosis, clubbing or edema.   Assessment/Plan:  Katelyn [name]  is a 55 y.o.  year old with a right 7 cm adnexal mass noted incidentally on CT scan and confirmed on ultrasound imaging. The CA 125 is within normal limits and the right mass is noted to have septations. There is strong consideration for a benign process. Discussion was held with the patient regarding the surgical approach. She was  informed of the possibility that the mass would be adherent to her vaginal cuff and that there was a strong intent to limit the possibility of intraoperative rupture. A robotic approach would best preserved these goals. The surgical schedule however does not allow for that approach immediately. The patient and her husband wishes performed as soon as possible.They are aware that surgery on December 18 is a possibility given the OR availability with an open approach. Every attempt will be made to use sealling instruments to limit the use of suture that may provoke a reaction. The risks of the procedure discussed with the patient were that of infection bleeding damage to surrounding structures, prolonged hospitalization and reoperation.   If this is a malignancy she understands that the procedure will be a bilateral salpingo-oophorectomy, omentectomy, pelvic  and periaortic lymph node dissection, biopsies debulking with other related staging procedures. If this is a benign process she is aware that at minimum a unilateral salpingo-oophorectomy to be performed. She was counseled regarding the benefits of ovarian preservation in a postmenopausal woman. Her final decision regarding USO versus BSO will be provided on the day of surgery.   Laurette Schimke, MD, PhD 10/23/2011, 5:11 PM

## 2011-10-23 NOTE — Patient Instructions (Signed)
Surgery 10/30/2011

## 2011-10-25 ENCOUNTER — Encounter (HOSPITAL_COMMUNITY): Payer: Self-pay | Admitting: Pharmacy Technician

## 2011-10-26 ENCOUNTER — Encounter (HOSPITAL_COMMUNITY)
Admission: RE | Admit: 2011-10-26 | Discharge: 2011-10-26 | Disposition: A | Discharge status: Home / Self Care | Payer: 59 | Source: Ambulatory Visit | Attending: Gynecologic Oncology | Admitting: Gynecologic Oncology

## 2011-10-26 ENCOUNTER — Encounter (HOSPITAL_COMMUNITY): Payer: Self-pay

## 2011-10-26 HISTORY — DX: Anemia, unspecified: D64.9

## 2011-10-26 HISTORY — DX: Other specified postprocedural states: Z98.890

## 2011-10-26 HISTORY — DX: Other specified postprocedural states: R11.2

## 2011-10-26 HISTORY — DX: Pneumonia, unspecified organism: J18.9

## 2011-10-26 HISTORY — DX: Headache: R51

## 2011-10-26 LAB — DIFFERENTIAL
Lymphocytes Relative: 34 % (ref 12–46)
Monocytes Absolute: 0.5 10*3/uL (ref 0.1–1.0)
Monocytes Relative: 6 % (ref 3–12)
Neutro Abs: 4.3 10*3/uL (ref 1.7–7.7)

## 2011-10-26 LAB — CBC
MCV: 85.5 fL (ref 78.0–100.0)
Platelets: 305 10*3/uL (ref 150–400)
RDW: 12.6 % (ref 11.5–15.5)
WBC: 7.5 10*3/uL (ref 4.0–10.5)

## 2011-10-26 LAB — COMPREHENSIVE METABOLIC PANEL
AST: 14 U/L (ref 0–37)
Albumin: 3.9 g/dL (ref 3.5–5.2)
Calcium: 9.7 mg/dL (ref 8.4–10.5)
Chloride: 105 mEq/L (ref 96–112)
Creatinine, Ser: 0.74 mg/dL (ref 0.50–1.10)
Total Bilirubin: 0.4 mg/dL (ref 0.3–1.2)

## 2011-10-26 NOTE — Patient Instructions (Signed)
20 Katelyn [name]  10/26/2011   Your procedure is scheduled on:  10/30/11   Surgery [id number]  Tuesday  Report to [facility] at 1230 PM.  Call this number if you have problems the morning of surgery: [phone]     [facility]  PST [id number]   Remember:   Do not eat food:After Midnight.   Monday night  May have clear liquids:until 0630 Tuesday AM, then none  Clear liquids include soda, tea, black coffee, apple or grape juice, broth.  Take these medicines the morning of surgery with A SIP OF WATER:   ATENOLOL and SYNTHROID WITH SIP WATER  Do not wear jewelry, make-up or nail polish.  Do not wear lotions, powders, or perfumes. You may wear deodorant.  Do not shave 48 hours prior to surgery.  Do not bring valuables to the hospital.  Contacts, dentures or bridgework may not be worn into surgery.  Leave suitcase in the car. After surgery it may be brought to your room.  For patients admitted to the hospital, checkout time is 11:00 AM the day of discharge.   Patients discharged the day of surgery will not be allowed to drive home.  Name and phone number of your driver:husband Special Instructions: CHG Shower Use Special Wash: 1/2 bottle night before surgery and 1/2 bottle morning of surgery.   REGULAR SOAP FACE AND PRIVATES   Please read over the following fact sheets that you were given: MRSA Information

## 2011-10-26 NOTE — Pre-Procedure Instructions (Signed)
CT ABDOMEN AND PELVIS DONE 10/12 -IN EPIC- states lung bases clear, small nodules middle lobe. EKG 10/12 in EPIC.  DENIES ANY LUNG PROBLEMS

## 2011-10-30 ENCOUNTER — Inpatient Hospital Stay (HOSPITAL_COMMUNITY)
Admission: RE | Admit: 2011-10-30 | Discharge: 2011-11-01 | DRG: 743 | Disposition: A | Discharge status: Home / Self Care | Payer: 59 | Source: Ambulatory Visit | Attending: Obstetrics and Gynecology | Admitting: Obstetrics and Gynecology

## 2011-10-30 ENCOUNTER — Encounter (HOSPITAL_COMMUNITY): Payer: Self-pay | Admitting: Anesthesiology

## 2011-10-30 ENCOUNTER — Other Ambulatory Visit: Payer: Self-pay | Admitting: Gynecologic Oncology

## 2011-10-30 ENCOUNTER — Encounter (HOSPITAL_COMMUNITY): Payer: Self-pay

## 2011-10-30 ENCOUNTER — Encounter (HOSPITAL_COMMUNITY)
Admission: RE | Disposition: A | Discharge status: Home / Self Care | Payer: Self-pay | Source: Ambulatory Visit | Attending: Obstetrics and Gynecology

## 2011-10-30 ENCOUNTER — Inpatient Hospital Stay (HOSPITAL_COMMUNITY): Payer: 59 | Admitting: Anesthesiology

## 2011-10-30 DIAGNOSIS — L988 Other specified disorders of the skin and subcutaneous tissue: Secondary | ICD-10-CM | POA: Diagnosis not present

## 2011-10-30 DIAGNOSIS — M5380 Other specified dorsopathies, site unspecified: Secondary | ICD-10-CM | POA: Diagnosis not present

## 2011-10-30 DIAGNOSIS — D279 Benign neoplasm of unspecified ovary: Principal | ICD-10-CM | POA: Diagnosis present

## 2011-10-30 DIAGNOSIS — I10 Essential (primary) hypertension: Secondary | ICD-10-CM | POA: Diagnosis present

## 2011-10-30 DIAGNOSIS — E039 Hypothyroidism, unspecified: Secondary | ICD-10-CM | POA: Diagnosis present

## 2011-10-30 DIAGNOSIS — Z01812 Encounter for preprocedural laboratory examination: Secondary | ICD-10-CM

## 2011-10-30 HISTORY — PX: LAPAROTOMY: SHX154

## 2011-10-30 HISTORY — PX: SALPINGOOPHORECTOMY: SHX82

## 2011-10-30 LAB — SAMPLE TO BLOOD BANK

## 2011-10-30 SURGERY — LAPAROTOMY, EXPLORATORY
Anesthesia: General | Site: Abdomen | Laterality: Right | Wound class: Clean Contaminated

## 2011-10-30 MED ORDER — OXYCODONE-ACETAMINOPHEN 5-325 MG PO TABS
1.0000 | ORAL_TABLET | ORAL | Status: DC | PRN
  Administered 2011-10-31: 2 via ORAL
  Filled 2011-10-30: qty 2

## 2011-10-30 MED ORDER — SENSITIVE EYES PLUS SALINE SOLN: 1.0000 [drp] | Freq: Two times a day (BID) | Status: DC

## 2011-10-30 MED ORDER — PROPOFOL 10 MG/ML IV BOLUS
INTRAVENOUS | Status: DC | PRN
  Administered 2011-10-30: 200 mg via INTRAVENOUS

## 2011-10-30 MED ORDER — IBUPROFEN 800 MG PO TABS: 800.0000 mg | ORAL_TABLET | Freq: Three times a day (TID) | ORAL | Status: DC | PRN

## 2011-10-30 MED ORDER — POLYVINYL ALCOHOL 1.4 % OP SOLN
1.0000 [drp] | Freq: Two times a day (BID) | OPHTHALMIC | Status: DC
  Administered 2011-10-30: 1 [drp] via OPHTHALMIC
  Filled 2011-10-30 (×2): qty 15

## 2011-10-30 MED ORDER — ROCURONIUM BROMIDE 100 MG/10ML IV SOLN
INTRAVENOUS | Status: DC | PRN
  Administered 2011-10-30: 40 mg via INTRAVENOUS

## 2011-10-30 MED ORDER — NALOXONE HCL 0.4 MG/ML IJ SOLN: 0.4000 mg | INTRAMUSCULAR | Status: DC | PRN

## 2011-10-30 MED ORDER — HYDROMORPHONE 0.3 MG/ML IV SOLN
INTRAVENOUS | Status: DC
  Administered 2011-10-30: 18:00:00 via INTRAVENOUS
  Administered 2011-10-30: 0.4 mg via INTRAVENOUS
  Administered 2011-10-31: 0.599 mg via INTRAVENOUS
  Administered 2011-10-31: 0.799 mg via INTRAVENOUS
  Filled 2011-10-30: qty 25

## 2011-10-30 MED ORDER — LEVOTHYROXINE SODIUM 150 MCG PO TABS
150.0000 ug | ORAL_TABLET | ORAL | Status: DC
  Administered 2011-10-31 – 2011-11-01 (×2): 150 ug via ORAL
  Filled 2011-10-30 (×3): qty 1

## 2011-10-30 MED ORDER — ONDANSETRON HCL 4 MG PO TABS: 4.0000 mg | ORAL_TABLET | Freq: Four times a day (QID) | ORAL | Status: DC | PRN

## 2011-10-30 MED ORDER — HYDROMORPHONE HCL PF 1 MG/ML IJ SOLN
0.2500 mg | INTRAMUSCULAR | Status: DC | PRN
  Administered 2011-10-30: 0.25 mg via INTRAVENOUS
  Administered 2011-10-30 (×3): 0.5 mg via INTRAVENOUS
  Administered 2011-10-30: 0.25 mg via INTRAVENOUS

## 2011-10-30 MED ORDER — DIPHENHYDRAMINE HCL 50 MG/ML IJ SOLN
25.0000 mg | Freq: Once | INTRAMUSCULAR | Status: AC
  Administered 2011-10-30: 25 mg via INTRAVENOUS

## 2011-10-30 MED ORDER — SODIUM CHLORIDE 0.9 % IJ SOLN: 9.0000 mL | INTRAMUSCULAR | Status: DC | PRN

## 2011-10-30 MED ORDER — FENTANYL CITRATE 0.05 MG/ML IJ SOLN
INTRAMUSCULAR | Status: DC | PRN
  Administered 2011-10-30: 50 ug via INTRAVENOUS
  Administered 2011-10-30 (×2): 100 ug via INTRAVENOUS

## 2011-10-30 MED ORDER — PROMETHAZINE HCL 25 MG/ML IJ SOLN: 6.2500 mg | INTRAMUSCULAR | Status: DC | PRN

## 2011-10-30 MED ORDER — LACTATED RINGERS IV BOLUS (SEPSIS)
250.0000 mL | Freq: Once | INTRAVENOUS | Status: AC
  Administered 2011-10-30: 250 mL via INTRAVENOUS

## 2011-10-30 MED ORDER — ONDANSETRON HCL 4 MG/2ML IJ SOLN
4.0000 mg | Freq: Four times a day (QID) | INTRAMUSCULAR | Status: DC | PRN
  Administered 2011-10-30: 4 mg via INTRAVENOUS
  Filled 2011-10-30: qty 2

## 2011-10-30 MED ORDER — DEXTROSE 5 % IV SOLN
1.0000 g | Freq: Three times a day (TID) | INTRAVENOUS | Status: AC
  Administered 2011-10-30: 1 g via INTRAVENOUS
  Filled 2011-10-30: qty 1

## 2011-10-30 MED ORDER — LACTATED RINGERS IV SOLN
INTRAVENOUS | Status: DC
  Administered 2011-10-30: 17:00:00 via INTRAVENOUS

## 2011-10-30 MED ORDER — ATENOLOL 25 MG PO TABS
25.0000 mg | ORAL_TABLET | Freq: Every day | ORAL | Status: DC
  Administered 2011-11-01: 25 mg via ORAL
  Filled 2011-10-30 (×2): qty 1

## 2011-10-30 MED ORDER — DIPHENHYDRAMINE HCL 12.5 MG/5ML PO ELIX
12.5000 mg | ORAL_SOLUTION | Freq: Four times a day (QID) | ORAL | Status: DC | PRN
  Filled 2011-10-30: qty 5

## 2011-10-30 MED ORDER — KETOROLAC TROMETHAMINE 30 MG/ML IJ SOLN
30.0000 mg | Freq: Four times a day (QID) | INTRAMUSCULAR | Status: DC
  Administered 2011-10-30: 30 mg via INTRAVENOUS

## 2011-10-30 MED ORDER — LACTATED RINGERS IV SOLN
INTRAVENOUS | Status: DC
  Administered 2011-10-30: 16:00:00 via INTRAVENOUS
  Administered 2011-10-30: 1000 mL via INTRAVENOUS

## 2011-10-30 MED ORDER — GLYCOPYRROLATE 0.2 MG/ML IJ SOLN
INTRAMUSCULAR | Status: DC | PRN
  Administered 2011-10-30: .6 mg via INTRAVENOUS

## 2011-10-30 MED ORDER — 0.9 % SODIUM CHLORIDE (POUR BTL) OPTIME
TOPICAL | Status: DC | PRN
  Administered 2011-10-30: 1000 mL

## 2011-10-30 MED ORDER — ONDANSETRON HCL 4 MG/2ML IJ SOLN: 4.0000 mg | Freq: Four times a day (QID) | INTRAMUSCULAR | Status: DC | PRN

## 2011-10-30 MED ORDER — MENTHOL 3 MG MT LOZG: 1.0000 | LOZENGE | OROMUCOSAL | Status: DC | PRN

## 2011-10-30 MED ORDER — HYDROMORPHONE 0.3 MG/ML IV SOLN: INTRAVENOUS | Status: DC

## 2011-10-30 MED ORDER — LIDOCAINE HCL (CARDIAC) 20 MG/ML IV SOLN
INTRAVENOUS | Status: DC | PRN
  Administered 2011-10-30: 60 mg via INTRAVENOUS
  Administered 2011-10-30: 40 mg via INTRAVENOUS

## 2011-10-30 MED ORDER — ONDANSETRON HCL 4 MG/2ML IJ SOLN
INTRAMUSCULAR | Status: DC | PRN
  Administered 2011-10-30: 4 mg via INTRAVENOUS

## 2011-10-30 MED ORDER — DEXAMETHASONE SODIUM PHOSPHATE 10 MG/ML IJ SOLN
INTRAMUSCULAR | Status: DC | PRN
  Administered 2011-10-30: 10 mg via INTRAVENOUS

## 2011-10-30 MED ORDER — MIDAZOLAM HCL 5 MG/5ML IJ SOLN
INTRAMUSCULAR | Status: DC | PRN
  Administered 2011-10-30: 2 mg via INTRAVENOUS

## 2011-10-30 MED ORDER — GENTAMICIN SULFATE 40 MG/ML IJ SOLN
INTRAVENOUS | Status: DC
  Filled 2011-10-30: qty 2.5

## 2011-10-30 MED ORDER — DIPHENHYDRAMINE HCL 50 MG/ML IJ SOLN
12.5000 mg | Freq: Four times a day (QID) | INTRAMUSCULAR | Status: DC | PRN
  Administered 2011-10-30 (×2): 12.5 mg via INTRAVENOUS

## 2011-10-30 MED ORDER — DIPHENHYDRAMINE HCL 50 MG/ML IJ SOLN
12.5000 mg | Freq: Four times a day (QID) | INTRAMUSCULAR | Status: DC | PRN
  Filled 2011-10-30: qty 1

## 2011-10-30 MED ORDER — NEOSTIGMINE METHYLSULFATE 1 MG/ML IJ SOLN
INTRAMUSCULAR | Status: DC | PRN
  Administered 2011-10-30: 4 mg via INTRAVENOUS

## 2011-10-30 MED ORDER — STERILE WATER FOR IRRIGATION IR SOLN
Status: DC | PRN
  Administered 2011-10-30: 1500 mL

## 2011-10-30 SURGICAL SUPPLY — 40 items
ATTRACTOMAT 16X20 MAGNETIC DRP (DRAPES) ×3 IMPLANT
BAG URINE DRAINAGE (UROLOGICAL SUPPLIES) ×3 IMPLANT
CANISTER SUCTION 2500CC (MISCELLANEOUS) ×3 IMPLANT
CLIP TI MEDIUM LARGE 6 (CLIP) ×12 IMPLANT
CLOTH BEACON ORANGE TIMEOUT ST (SAFETY) ×3 IMPLANT
COVER SURGICAL LIGHT HANDLE (MISCELLANEOUS) ×3 IMPLANT
DRAPE UTILITY 15X26 (DRAPE) ×1 IMPLANT
DRAPE WARM FLUID 44X44 (DRAPE) ×3 IMPLANT
DRSG TELFA 4X14 ISLAND ADH (GAUZE/BANDAGES/DRESSINGS) ×2 IMPLANT
ELECT REM PT RETURN 9FT ADLT (ELECTROSURGICAL) ×3
ELECTRODE REM PT RTRN 9FT ADLT (ELECTROSURGICAL) ×2 IMPLANT
GAUZE SPONGE 4X4 16PLY XRAY LF (GAUZE/BANDAGES/DRESSINGS) ×2 IMPLANT
GLOVE BIOGEL M STRL SZ7.5 (GLOVE) ×10 IMPLANT
GLOVE SURG ORTHO 8.5 STRL (GLOVE) ×3 IMPLANT
GLOVE SURG SS PI 7.0 STRL IVOR (GLOVE) ×3 IMPLANT
GLOVE SURG SS PI 7.5 STRL IVOR (GLOVE) ×5 IMPLANT
GOWN STRL REIN XL XLG (GOWN DISPOSABLE) ×4 IMPLANT
LIGASURE IMPACT 36 18CM CVD LR (INSTRUMENTS) ×3 IMPLANT
NS IRRIG 1000ML POUR BTL (IV SOLUTION) ×9 IMPLANT
PACK ABDOMINAL WL (CUSTOM PROCEDURE TRAY) ×3 IMPLANT
SHEET LAVH (DRAPES) ×3 IMPLANT
SPONGE LAP 18X18 X RAY DECT (DISPOSABLE) ×4 IMPLANT
SUT ETHILON 1 LR 30 (SUTURE) IMPLANT
SUT PDS AB 0 CT1 36 (SUTURE) IMPLANT
SUT PDS AB 0 CTX 60 (SUTURE) ×6 IMPLANT
SUT SILK 2 0 (SUTURE)
SUT SILK 2 0 30  PSL (SUTURE)
SUT SILK 2 0 30 PSL (SUTURE) IMPLANT
SUT SILK 2-0 18XBRD TIE 12 (SUTURE) ×2 IMPLANT
SUT VIC AB 0 CT1 36 (SUTURE) ×4 IMPLANT
SUT VIC AB 2-0 CT2 27 (SUTURE) IMPLANT
SUT VIC AB 2-0 SH 27 (SUTURE)
SUT VIC AB 2-0 SH 27X BRD (SUTURE) IMPLANT
SUT VIC AB 3-0 CTX 36 (SUTURE) IMPLANT
SUT VIC AB 4-0 PS1 27 (SUTURE) ×4 IMPLANT
SUT VICRYL 0 TIES 12 18 (SUTURE) ×2 IMPLANT
TOWEL OR NON WOVEN STRL DISP B (DISPOSABLE) ×3 IMPLANT
TRAY FOLEY BAG SILVER LF 14FR (CATHETERS) ×1 IMPLANT
TRAY FOLEY CATH 14FRSI W/METER (CATHETERS) ×2 IMPLANT
WATER STERILE IRR 1500ML POUR (IV SOLUTION) ×4 IMPLANT

## 2011-10-30 NOTE — Progress Notes (Signed)
Dr. Henderson Cloud called and notified of patient's BP of  86/56 and pulse of 69. Pt asymptomatic with no complaints of dizziness. Order received to give 250 bolus of LR. Will recheck BP after bolus given.

## 2011-10-30 NOTE — Op Note (Signed)
Preoperative Diagnosis: Right pelvic mass  Postoperative Diagnosis: Right ovarian adenofibroma  Procedure(s) Performed: Exploratory laparotomy right salpingo-oophorectomy  Anesthesia: Gen. endotracheal  Surgeon: Maryclare Labrador.  Nelly Rout, M.D. PhD  Assistant Surgeon: Carrington Clamp M.D. Assistant: Telford Nab RN, MSN  Specimens: Pelvic washings right ovary and fallopian tube  Estimated Blood Loss: Minimal mL. Blood Replacement: None  Complications: None.   Indication for Procedure: This is a 55 year old who at the time of evaluation for recurrent nephrolithiasis was noted to have a right adnexal mass. Ultrasonographic imaging was notable for increased blood flow. CA 125 was within normal limits. Patient was counseled regarding the approach, unilateral salpingo-oophorectomy versus bilateral salpingo-oophorectomy. She opted for unilateral salpingo-oophorectomy if the surgical findings were benign.  Operative Findings: 10 cm right adnexal mass with filmy adhesions to the pelvic sidewall. Normal atrophic left adnexa  Frozen pathology was consistent with adenofibroma.  Procedure: Patient was taken to the operating room and placed under general endotracheal anesthesia. Rectovaginal examination under anesthesia was notable for a mobile mass with no nodularity within the cul-de-sac .She was placed in the dorsal lithotomy position and prepped with considerations for her numerous allergies. She's been draped. Care was taken not to expose the patient to the adhesive of the drape.  A Pfannenstiel incision was made and the pelvis and abdomen were entered. Washings were obtained. The upper abdomen was explored was notable for the absence of adenopathy. There were no palpable liver or pancreatic masses. A Bookwalter retractor was placed and labs used to secure the intestinal contents into the upper abdomen.  The right adnexa was appreciated and the left was noted to be atrophic in appearance. The right  retroperitoneal space was entered and the ureter was identified. The infundibulopelvic ligament was secured and transected using the LigaSure. The adhesions to the pelvic sidewall were resected the Bovie cautery. The specimen was sent for frozen section. The pelvis was copiously irrigated and drained and hemostasis was assured.  With the benign findings the veins fascia was closed. Zero looped PDS suture in a running fashion with sutures overlapping in the midline. The subcutaneous tissues were copiously irrigated and drained and hemostasis was assured. The skin was closed with staples  Sponge, lap and needle counts were correct x 3.    The patient had sequential compression devices for VTE prophylaxis.          Disposition: PACU - hemodynamically stable.         Condition: stable

## 2011-10-30 NOTE — Anesthesia Postprocedure Evaluation (Signed)
  Anesthesia Post-op Note  Patient: Katelyn [name]  Procedure(s) Performed:  EXPLORATORY LAPAROTOMY; SALPINGO OOPHERECTOMY - RIGHT  Patient Location: PACU  Anesthesia Type: General  Level of Consciousness: awake and alert   Airway and Oxygen Therapy: Patient Spontanous Breathing  Post-op Pain: mild  Post-op Assessment: Post-op Vital signs reviewed, Patient's Cardiovascular Status Stable, Respiratory Function Stable, Patent Airway and No signs of Nausea or vomiting  Post-op Vital Signs: stable  Complications: No apparent anesthesia complications

## 2011-10-30 NOTE — Interval H&P Note (Signed)
History and Physical Interval Note:  10/30/2011 3:03 PM  Katelyn [name]  has presented today for surgery, with the diagnosis of Pelvic Mass  The various methods of treatment have been discussed with the patient and family. After consideration of risks, benefits and other options for treatment, the patient has consented to  Procedure(s): EXPLORATORY LAPAROTOMY SALPINGO OOPHERECTOMY POSSIBLE BILATERAL SALPINGO-OOPHORECTOMY OMENTECTOMY SURGICAL STAGING OR OTHER INDICATED PROCEDURES as a surgical intervention .  The patients' history has been reviewed, patient examined, no change in status, stable for surgery.  I have reviewed the patients' chart and labs.  Questions were answered to the patient's satisfaction.     Haruko Mersch

## 2011-10-30 NOTE — Progress Notes (Signed)
Dr. Henderson Cloud called and notified of patient's BP of 79/52 and pulse of 72. Pt is alert and oriented with no complaints of dizziness or feeling light headed. Pt currently using dilaudid PCA. Dr. Henderson Cloud stated to call if there is a change in the patient's mental status or if there is an increase in pulse.

## 2011-10-30 NOTE — H&P (View-Only) (Signed)
Consult Note: Gyn-Onc  Consult was requested by Dr. Horvath for the evaluation of Katelyn [name] 55 y.o. female  CC:  Chief Complaint  Patient presents with  . Ovarian Cyst    Pelvic mass    HPI: This is a 55-year-old gravida 2 para 2 last normal menstrual period age of 27 at which time she underwent a vaginal hysterectomy .  She was evaluated in the emergency room for left-sided nephrolithiasis on 08/30/2011.  CT imaging for the evaluation of nephrolithiasis  noted "Complex mass is seen in the right lower pelvis with areas of high and low density. This measures 7.0 x 5.7 cm. This is presumably arising from the right ovary. No or free fluid or free air."  Pelvic UTZ 08/30/2011" Right Ovary: 7.5 x 5.6 x 6.4 cm. Complex cystic mass in the right  ovary measures 5.7 x 5.6 x 5.4 cm. The septations are seen within  the cystic areas with soft tissue components. Some areas are hyperechoic suggesting the possibility of fat or calcifications.  Differential considerations would include a dermoid cyst or a cystic ovarian neoplasm (favored).  Left Ovary: 2.0 x 2.0 x 1.3 cm. 1.8 cm cyst which appears simple."  CA 125 collected 09/29/2011 returned to value of 9 FSH is noted to be 53.5 which is in the menopausal range. The patient denies nausea vomiting constipation weight loss abdominal bloating or any of the traditional stigmata of ovarian cancer. Her screening history notable for mammogram several years ago on colonoscopy 4-5 years ago    Performance status: 0  Review of Systems:  Constitutional  Feels well,  denies anorexia Skin/Breast  No rash, sores, jaundice, itching, dryness Cardiovascular  No chest pain, shortness of breath, or edema  Pulmonary  No cough or wheeze.  Gastro Intestinal  No nausea, vomitting, or constipation No bright red blood per rectum, no abdominal pain, change in bowel movement.  Reports daily diarrhea from her irritable bowel disease.  Genito Urinary  No frequency,  urgency, dysuria, no vaginal bleeding or discharge Musculo Skeletal  No myalgia, arthralgia, joint swelling or pain  Neurologic  No weakness, numbness, change in gait,  Psychology  No depression, anxiety, insomnia.  Allergies Patient reports allergies to latex and nitril gloves. She also reports an allergic reaction to the suture used in her recent foot surgery. Upon review Vicryl and nylon were used. She reports the need to treat incisional inflammation with steroids.  Current Meds:  Outpatient Encounter Prescriptions as of 10/23/2011  Medication Sig Dispense Refill  . atenolol (TENORMIN) 25 MG tablet Take 1 tablet (25 mg total) by mouth daily. As needed for headaches  90 tablet  5  . levothyroxine (SYNTHROID, LEVOTHROID) 150 MCG tablet Take 1 tablet (150 mcg total) by mouth daily.  90 tablet  4  . phentermine 30 MG capsule Take 30 mg by mouth every morning.        . HYDROcodone-acetaminophen (VICODIN) 5-500 MG per tablet Take 1 tablet by mouth every 6 (six) hours as needed.  60 tablet  2    Allergy:  Allergies  Allergen Reactions  . Gloves Hives and Shortness Of Breath    Nitrile gloves,  . Aspirin   . Ciprofloxacin   . Erythromycin   . Latex   . Penicillins   . Sulfonamide Derivatives     Social Hx:   History   Social History  . Marital Status: Married    Spouse Name: N/A    Number of Children: N/A  .   Years of Education: N/A   Occupational History  . Not on file.   Social History Main Topics  . Smoking status: Never Smoker   . Smokeless tobacco: Never Used  . Alcohol Use: No  . Drug Use: No  . Sexually Active: Yes   Other Topics Concern  . Not on file   Social History Narrative  . No narrative on file    Past Surgical Hx:  Past Surgical History  Procedure Date  . Abdominal hysterectomy   . Appendectomy   . Tonsillectomy   . Tympanostomy tube placement   . Foot surgery 2007    tumor removed from right foot    Past Medical Hx:  Past Medical  History  Diagnosis Date  . HYPOTHYROIDISM 05/27/2007  . WEIGHT GAIN 07/15/2009    Past Gynecological History:  Gravida 2 para 2 hysterectomy the age of 27  Family Hx:  Family History  Problem Relation Age of Onset  . Hypertension Neg Hx     family hx  . Heart disease Neg Hx     family hx  . Alzheimer's disease Mother   . Cervical cancer Mother   . Diabetes Sister   . Heart attack Brother     Vitals:  Blood pressure 132/64, pulse 78, temperature 98.4 F (36.9 C), temperature source Oral, resp. rate 20, height 5' 6" (1.676 m), weight 154 lb 3.2 oz (69.945 kg).  Physical Exam: WD in NAD Neck  Supple NROM, without any enlargements.  Lymph Node Survey No cervical supraclavicular or inguinal adenopathy Cardiovascular  Pulse normal rate, regularity and rhythm. S1 and S2 normal.  Lungs  Clear to auscultation bilateraly, without wheezes/crackles/rhonchi. Good air movement.  Skin  No rash/lesions/breakdown  Psychiatry  Alert and oriented to person, place, and time  Abdomen  Normoactive bowel sounds, abdomen soft, non-tender and obese. No omental cake no guarding no fluid wave no palpable masses Back No CVA tenderness Genito Urinary  Vulva/vagina: Normal external female genitalia.  No lesions. No discharge or bleeding.  Bladder/urethra:  No lesions or masses  Vagina: Atrophic without any lesion Cervix: Absent. Uterus: Absent  Adnexa: No palpable masses. No nodularity noted in the cul-de-sac Rectal  Patient declined because of fear of provoking diarrhea. Extremities  No bilateral cyanosis, clubbing or edema.   Assessment/Plan:  Ms. Katelyn [name]  is a 55 y.o.  year old with a right 7 cm adnexal mass noted incidentally on CT scan and confirmed on ultrasound imaging. The CA 125 is within normal limits and the right mass is noted to have septations. There is strong consideration for a benign process. Discussion was held with the patient regarding the surgical approach. She was  informed of the possibility that the mass would be adherent to her vaginal cuff and that there was a strong intent to limit the possibility of intraoperative rupture. A robotic approach would best preserved these goals. The surgical schedule however does not allow for that approach immediately. The patient and her husband wishes performed as soon as possible.They are aware that surgery on December 18 is a possibility given the OR availability with an open approach. Every attempt will be made to use sealling instruments to limit the use of suture that may provoke a reaction. The risks of the procedure discussed with the patient were that of infection bleeding damage to surrounding structures, prolonged hospitalization and reoperation.   If this is a malignancy she understands that the procedure will be a bilateral salpingo-oophorectomy, omentectomy, pelvic   and periaortic lymph node dissection, biopsies debulking with other related staging procedures. If this is a benign process she is aware that at minimum a unilateral salpingo-oophorectomy to be performed. She was counseled regarding the benefits of ovarian preservation in a postmenopausal woman. Her final decision regarding USO versus BSO will be provided on the day of surgery.   Ceara Wrightson, MD, PhD 10/23/2011, 5:11 PM   

## 2011-10-30 NOTE — Anesthesia Preprocedure Evaluation (Addendum)
Anesthesia Evaluation  Patient identified by MRN, date of birth, ID band Patient awake    Reviewed: Allergy & Precautions, H&P , NPO status , Patient's Chart, lab work & pertinent test results, reviewed documented beta blocker date and time   History of Anesthesia Complications (+) PONV  Airway Mallampati: II TM Distance: >3 FB Neck ROM: full    Dental  (+) Chipped and Dental Advisory Given,    Pulmonary neg pulmonary ROS, pneumonia ,  clear to auscultation  Pulmonary exam normal       Cardiovascular Exercise Tolerance: Good hypertension, On Home Beta Blockers regular Normal    Neuro/Psych  Headaches, Negative Neurological ROS  Negative Psych ROS   GI/Hepatic negative GI ROS, Neg liver ROS,   Endo/Other  Negative Endocrine ROSHypothyroidism   Renal/GU negative Renal ROS  Genitourinary negative   Musculoskeletal   Abdominal   Peds  Hematology negative hematology ROS (+)   Anesthesia Other Findings   Reproductive/Obstetrics negative OB ROS                          Anesthesia Physical Anesthesia Plan  ASA: II  Anesthesia Plan: General   Post-op Pain Management:    Induction: Intravenous  Airway Management Planned: Oral ETT  Additional Equipment:   Intra-op Plan:   Post-operative Plan: Extubation in OR  Informed Consent: I have reviewed the patients History and Physical, chart, labs and discussed the procedure including the risks, benefits and alternatives for the proposed anesthesia with the patient or authorized representative who has indicated his/her understanding and acceptance.   Dental Advisory Given  Plan Discussed with: CRNA  Anesthesia Plan Comments:         Anesthesia Quick Evaluation

## 2011-10-30 NOTE — Transfer of Care (Signed)
Immediate Anesthesia Transfer of Care Note  Patient: Katelyn [name]  Procedure(s) Performed:  EXPLORATORY LAPAROTOMY; SALPINGO OOPHERECTOMY - RIGHT  Patient Location: PACU  Anesthesia Type: General  Level of Consciousness: awake, alert  and oriented  Airway & Oxygen Therapy: Patient Spontanous Breathing and Patient connected to face mask oxygen  Post-op Assessment: Report given to PACU RN, Post -op Vital signs reviewed and stable and Patient moving all extremities X 4  Post vital signs: stable  Complications: No apparent anesthesia complications

## 2011-10-31 ENCOUNTER — Other Ambulatory Visit: Payer: Self-pay | Admitting: Gynecologic Oncology

## 2011-10-31 LAB — CBC
Hemoglobin: 10.2 g/dL — ABNORMAL LOW (ref 12.0–15.0)
RBC: 3.52 MIL/uL — ABNORMAL LOW (ref 3.87–5.11)

## 2011-10-31 MED ORDER — CYCLOBENZAPRINE HCL 5 MG PO TABS
5.0000 mg | ORAL_TABLET | Freq: Three times a day (TID) | ORAL | Status: DC | PRN
  Administered 2011-10-31 (×2): 5 mg via ORAL
  Filled 2011-10-31 (×3): qty 1

## 2011-10-31 NOTE — Progress Notes (Signed)
Utilization review completed.  

## 2011-10-31 NOTE — Progress Notes (Signed)
Patient blood pressure 90/58, having back spasms. Dr. Henderson Cloud notified. Order to discontinue PCA, and she will be in to see patient.

## 2011-10-31 NOTE — Progress Notes (Signed)
Patient is eating, ambulating, and voiding.  Pain control is good.  BP 83/48  Pulse 84  Temp(Src) 98.4 F (36.9 C) (Oral)  Resp 16  Ht 5\' 7"  (1.702 m)  Wt 71.215 kg (157 lb)  BMI 24.59 kg/m2  SpO2 93%  lungs:   clear to auscultation cor:    RRR Abdomen:  soft, appropriate tenderness, incisions intact and without erythema or exudate. ex:    no cords   Lab Results  Component Value Date   WBC 15.6* 10/31/2011   HGB 10.2* 10/31/2011   HCT 30.3* 10/31/2011   MCV 86.1 10/31/2011   PLT 281 10/31/2011    A/P  Routine care.  Expect d/c per plan.  BP was low on dilaudid but H/H and vitals are stable.

## 2011-11-01 ENCOUNTER — Encounter (HOSPITAL_COMMUNITY): Payer: Self-pay | Admitting: Gynecologic Oncology

## 2011-11-01 MED ORDER — CYCLOBENZAPRINE HCL 5 MG PO TABS: 5.0000 mg | ORAL_TABLET | Freq: Three times a day (TID) | ORAL | Status: AC | PRN

## 2011-11-01 MED ORDER — LIDOCAINE HCL 2 % EX GEL: Freq: Two times a day (BID) | CUTANEOUS | Status: AC

## 2011-11-01 MED ORDER — HYDROMORPHONE HCL 2 MG PO TABS: 2.0000 mg | ORAL_TABLET | ORAL | Status: AC | PRN

## 2011-11-01 MED ORDER — HYDROMORPHONE HCL 2 MG PO TABS
2.0000 mg | ORAL_TABLET | ORAL | Status: DC | PRN
  Administered 2011-11-01: 2 mg via ORAL
  Filled 2011-11-01: qty 1

## 2011-11-01 NOTE — Progress Notes (Signed)
Patient is eating, ambulating, and voiding.  Pain control is good.  BP 108/63  Pulse 111  Temp(Src) 98.8 F (37.1 C) (Oral)  Resp 18  Ht 5\' 7"  (1.702 m)  Wt 71.215 kg (157 lb)  BMI 24.59 kg/m2  SpO2 94%  lungs:   clear to auscultation cor:    RRR Abdomen:  soft, appropriate tenderness, incisions intact and without erythema or exudate. ex:    no cords   Lab Results  Component Value Date   WBC 15.6* 10/31/2011   HGB 10.2* 10/31/2011   HCT 30.3* 10/31/2011   MCV 86.1 10/31/2011   PLT 281 10/31/2011    A/P  Routine care.  Expect d/c per plan.  Some blistering on abdomen from tape but stable and superficial.  Incision intact.  Staples out in office tomorrow.  Back spasms continue but better on flexeril.  Pt could not swallow percocet- will change to dilaudid for home.

## 2011-11-01 NOTE — Discharge Summary (Signed)
Physician Discharge Summary  Patient ID: Katelyn [name] MRN: [id number] DOB/AGE: [birth year] 55 y.o.  Admit date: 10/30/2011 Discharge date: 11/01/2011  Admission Diagnoses: Ovarian mass.  Discharge Diagnoses: Adenofibroma of ovary Active Problems:  * No active hospital problems. *    Discharged Condition: good  Hospital Course: Underwent uncomplicated laparotomy for a suspicious ovarian mass.  Frozen section indicated benign adenofibroma.  Pt had relatively uncomplicated post op course.  The pt did have some blistering from tape over incision but no other problems.  She tolerated flexeril for back spasms.  She was not able to swallow percocet so she was sent home with dilaudid.  Consults: none  Significant Diagnostic Studies:  Lab Results  Component Value Date   WBC 15.6* 10/31/2011   HGB 10.2* 10/31/2011   HCT 30.3* 10/31/2011   MCV 86.1 10/31/2011   PLT 281 10/31/2011   Frozen section: benign adenofibroma.  Treatments: surgery: Exploratory laparotomy with RSO.  Discharge Exam: Blood pressure 108/63, pulse 111, temperature 98.8 F (37.1 C), temperature source Oral, resp. rate 18, height 5\' 7"  (1.702 m), weight 71.215 kg (157 lb), SpO2 94.00%. see progress note.  Disposition: Home or Self Care  Discharge Orders    Future Appointments: Provider: Department: Dept Phone: Center:   01/17/2012 3:45 PM Rogelia Boga [facility] [phone] [facility]     Medication List  As of 11/01/2011  9:12 AM   START taking these medications         cyclobenzaprine 5 MG tablet   Commonly known as: FLEXERIL   Take 1 tablet (5 mg total) by mouth 3 (three) times daily as needed for muscle spasms.      HYDROmorphone 2 MG tablet   Commonly known as: DILAUDID   Take 1 tablet (2 mg total) by mouth every 4 (four) hours as needed for pain.      lidocaine 2 % jelly   Commonly known as: XYLOCAINE   Apply topically 2 (two) times daily.         CONTINUE taking these  medications         atenolol 25 MG tablet   Commonly known as: TENORMIN      celecoxib 200 MG capsule   Commonly known as: CELEBREX      cholecalciferol 1000 UNITS tablet   Commonly known as: VITAMIN D      levothyroxine 150 MCG tablet   Commonly known as: SYNTHROID, LEVOTHROID      phentermine 30 MG capsule      Sensitive Eyes Plus Saline Soln      Vitamin B-12 2500 MCG Subl          Where to get your medications    These are the prescriptions that you need to pick up.   You may get these medications from any pharmacy.         cyclobenzaprine 5 MG tablet   HYDROmorphone 2 MG tablet   lidocaine 2 % jelly           Follow-up Information    Follow up with Annaliza Zia A. Make an appointment in 1 day.   Contact information:   [address] [place] [address] [phone]          Signed: Loney Laurence 11/01/2011, 9:12 AM

## 2011-11-01 NOTE — Progress Notes (Signed)
Discharge summary sent to payer through [facility]  

## 2012-01-17 ENCOUNTER — Other Ambulatory Visit: Payer: Self-pay

## 2012-01-17 ENCOUNTER — Ambulatory Visit (INDEPENDENT_AMBULATORY_CARE_PROVIDER_SITE_OTHER): Payer: 59 | Admitting: Internal Medicine

## 2012-01-17 ENCOUNTER — Encounter: Payer: Self-pay | Admitting: Internal Medicine

## 2012-01-17 DIAGNOSIS — I10 Essential (primary) hypertension: Secondary | ICD-10-CM

## 2012-01-17 DIAGNOSIS — E039 Hypothyroidism, unspecified: Secondary | ICD-10-CM

## 2012-01-17 DIAGNOSIS — R911 Solitary pulmonary nodule: Secondary | ICD-10-CM

## 2012-01-17 MED ORDER — LEVOTHYROXINE SODIUM 150 MCG PO TABS: 150.0000 ug | ORAL_TABLET | ORAL | Status: DC

## 2012-01-17 MED ORDER — FLUTICASONE PROPIONATE 50 MCG/ACT NA SUSP: 2.0000 | Freq: Every day | NASAL | Status: DC

## 2012-01-17 NOTE — Patient Instructions (Signed)
Limit your sodium (Salt) intake  Return in 4 months for follow-up  Please check your blood pressure on a regular basis.  If it is consistently greater than 150/90, please make an office appointment.  

## 2012-01-17 NOTE — Progress Notes (Signed)
  Subjective:    Patient ID: Katelyn [name], female    DOB: [birth year], 56 y.o.   MRN: [id number]  HPI  56 year old patient who is seen today for followup. She has a history of hypertension and hypothyroidism. She was evaluated for abdominal pain in October and an abdominal CT scan revealed an incidental 5 mm nodule in the right lower lobe. The patient is a lifelong nonsmoker. Follow chest x-ray is recommended in 1 year    Review of Systems  Constitutional: Negative.   HENT: Negative for hearing loss, congestion, sore throat, rhinorrhea, dental problem, sinus pressure and tinnitus.   Eyes: Negative for pain, discharge and visual disturbance.  Respiratory: Negative for cough and shortness of breath.   Cardiovascular: Negative for chest pain, palpitations and leg swelling.  Gastrointestinal: Negative for nausea, vomiting, abdominal pain, diarrhea, constipation, blood in stool and abdominal distention.  Genitourinary: Negative for dysuria, urgency, frequency, hematuria, flank pain, vaginal bleeding, vaginal discharge, difficulty urinating, vaginal pain and pelvic pain.  Musculoskeletal: Negative for joint swelling, arthralgias and gait problem.  Skin: Negative for rash.  Neurological: Negative for dizziness, syncope, speech difficulty, weakness, numbness and headaches.  Hematological: Negative for adenopathy.  Psychiatric/Behavioral: Negative for behavioral problems, dysphoric mood and agitation. The patient is not nervous/anxious.        Objective:   Physical Exam  Constitutional: She is oriented to person, place, and time. She appears well-developed and well-nourished.  HENT:  Head: Normocephalic.  Right Ear: External ear normal.  Left Ear: External ear normal.  Mouth/Throat: Oropharynx is clear and moist.  Eyes: Conjunctivae and EOM are normal. Pupils are equal, round, and reactive to light.  Neck: Normal range of motion. Neck supple. No thyromegaly present.  Cardiovascular: Normal  rate, regular rhythm, normal heart sounds and intact distal pulses.   Pulmonary/Chest: Effort normal and breath sounds normal.  Abdominal: Soft. Bowel sounds are normal. She exhibits no mass. There is no tenderness.  Musculoskeletal: Normal range of motion.  Lymphadenopathy:    She has no cervical adenopathy.  Neurological: She is alert and oriented to person, place, and time.  Skin: Skin is warm and dry. No rash noted.  Psychiatric: She has a normal mood and affect. Her behavior is normal.          Assessment & Plan:   Hypothyroidism. We'll check a TSH 5 mm right lower lobe pulmonary nodule. The patient will return in 4 months we'll consider a followup chest CT at that time. Hypertension stable

## 2012-01-18 LAB — TSH: TSH: 0.11 u[IU]/mL — ABNORMAL LOW (ref 0.35–5.50)

## 2012-01-21 ENCOUNTER — Other Ambulatory Visit: Payer: Self-pay | Admitting: Internal Medicine

## 2012-01-21 MED ORDER — LEVOTHYROXINE SODIUM 125 MCG PO TABS: 125.0000 ug | ORAL_TABLET | Freq: Every day | ORAL | Status: DC

## 2012-01-21 NOTE — Progress Notes (Signed)
Quick Note:  Attempt to call- VM at both home and cel# - LMTCB - to discuss lab and change in med - [facility] ______

## 2012-02-12 ENCOUNTER — Encounter: Payer: Self-pay | Admitting: Internal Medicine

## 2012-02-12 ENCOUNTER — Ambulatory Visit (INDEPENDENT_AMBULATORY_CARE_PROVIDER_SITE_OTHER): Payer: 59 | Admitting: Internal Medicine

## 2012-02-12 VITALS — BP 110/80 | Temp 98.7°F | Wt 158.0 lb

## 2012-02-12 DIAGNOSIS — J069 Acute upper respiratory infection, unspecified: Secondary | ICD-10-CM

## 2012-02-12 DIAGNOSIS — E039 Hypothyroidism, unspecified: Secondary | ICD-10-CM

## 2012-02-12 DIAGNOSIS — I10 Essential (primary) hypertension: Secondary | ICD-10-CM

## 2012-02-12 MED ORDER — DOXYCYCLINE HYCLATE 100 MG PO TABS: 100.0000 mg | ORAL_TABLET | Freq: Two times a day (BID) | ORAL | Status: AC

## 2012-02-12 MED ORDER — LEVOTHYROXINE SODIUM 125 MCG PO TABS: 125.0000 ug | ORAL_TABLET | Freq: Every day | ORAL | Status: DC

## 2012-02-12 NOTE — Progress Notes (Signed)
  Subjective:    Patient ID: Katelyn [name], female    DOB: 04-17-1956, 56 y.o.   MRN: [id number]  HPI  56 year old patient who is seen today in followup she was seen one month ago and treated symptomatically for a URI. She has not improved and has developed worsening sinus pressure pain and drainage. Her Synthroid dose was down titrated it to a suppressed TSH. She states that she has not done well on generic levothyroxine.  Wt Readings from Last 3 Encounters:  02/12/12 158 lb (71.668 kg)  01/17/12 155 lb (70.308 kg)  10/30/11 157 lb (71.215 kg)    Review of Systems  Constitutional: Negative.   HENT: Positive for congestion, rhinorrhea and sinus pressure. Negative for hearing loss, sore throat, dental problem and tinnitus.   Eyes: Negative for pain, discharge and visual disturbance.  Respiratory: Positive for cough. Negative for shortness of breath.   Cardiovascular: Negative for chest pain, palpitations and leg swelling.  Gastrointestinal: Negative for nausea, vomiting, abdominal pain, diarrhea, constipation, blood in stool and abdominal distention.  Genitourinary: Negative for dysuria, urgency, frequency, hematuria, flank pain, vaginal bleeding, vaginal discharge, difficulty urinating, vaginal pain and pelvic pain.  Musculoskeletal: Negative for joint swelling, arthralgias and gait problem.  Skin: Negative for rash.  Neurological: Negative for dizziness, syncope, speech difficulty, weakness, numbness and headaches.  Hematological: Negative for adenopathy.  Psychiatric/Behavioral: Negative for behavioral problems, dysphoric mood and agitation. The patient is not nervous/anxious.        Objective:   Physical Exam  Constitutional: She is oriented to person, place, and time. She appears well-developed and well-nourished.  HENT:  Head: Normocephalic.  Right Ear: External ear normal.  Left Ear: External ear normal.  Mouth/Throat: Oropharynx is clear and moist.  Eyes: Conjunctivae and  EOM are normal. Pupils are equal, round, and reactive to light.  Neck: Normal range of motion. Neck supple. No thyromegaly present.  Cardiovascular: Normal rate, regular rhythm, normal heart sounds and intact distal pulses.   Pulmonary/Chest: Effort normal and breath sounds normal.  Abdominal: Soft. Bowel sounds are normal. She exhibits no mass. There is no tenderness.  Musculoskeletal: Normal range of motion.  Lymphadenopathy:    She has no cervical adenopathy.  Neurological: She is alert and oriented to person, place, and time.  Skin: Skin is warm and dry. No rash noted.  Psychiatric: She has a normal mood and affect. Her behavior is normal.          Assessment & Plan:   URI/possible subacute sinusitis. We'll treat with doxycycline Hypothyroidism. Will continue Synthroid 0.125 mg daily. Samples provided we'll check a TSH in 6 weeks

## 2012-02-12 NOTE — Patient Instructions (Addendum)
Get plenty of rest, Drink lots of  clear liquids, and use Tylenol or ibuprofen for fever and discomfort.    Take your antibiotic as prescribed until ALL of it is gone, but stop if you develop a rash, swelling, or any side effects of the medication.  Contact our office as soon as possible if  there are side effects of the medication.    Use saline irrigation, warm  moist compresses and over-the-counter decongestants only as directed.  Call if there is no improvement in 5 to 7 days, or sooner if you develop increasing pain, fever, or any new symptoms.

## 2012-02-21 ENCOUNTER — Other Ambulatory Visit: Payer: Self-pay

## 2012-02-21 MED ORDER — PHENTERMINE HCL 30 MG PO CAPS: 30.0000 mg | ORAL_CAPSULE | ORAL | Status: DC

## 2012-02-21 NOTE — Telephone Encounter (Signed)
Called in 30 day rx - doctor out of office until next week

## 2012-03-28 ENCOUNTER — Other Ambulatory Visit (INDEPENDENT_AMBULATORY_CARE_PROVIDER_SITE_OTHER): Payer: 59

## 2012-03-28 DIAGNOSIS — E039 Hypothyroidism, unspecified: Secondary | ICD-10-CM

## 2012-03-28 LAB — TSH: TSH: 10.45 u[IU]/mL — ABNORMAL HIGH (ref 0.35–5.50)

## 2012-03-31 NOTE — Progress Notes (Signed)
Quick Note:  Spoke with pt- informed of lab , is taking daily - informed need to increase to - she states she has some at home - will call if sheneeds RF of BRAND only synthroid ______

## 2012-03-31 NOTE — Progress Notes (Signed)
Quick Note:  Attempt to call celland hm# - VM at both - LMTCB to discuss labs and possible med adjustment ______

## 2012-04-02 ENCOUNTER — Telehealth: Payer: Self-pay | Admitting: Internal Medicine

## 2012-04-02 MED ORDER — SYNTHROID 150 MCG PO TABS: 150.0000 ug | ORAL_TABLET | Freq: Every day | ORAL | Status: DC

## 2012-04-02 NOTE — Telephone Encounter (Signed)
[facility] synthriod 150.

## 2012-04-02 NOTE — Telephone Encounter (Signed)
Pt called and said that she called [facility] and was told that if pcp writes a script for 90 day supply of Brand Name Only Synthroid 150 mg , that they have it in stock.

## 2012-04-23 ENCOUNTER — Other Ambulatory Visit: Payer: Self-pay

## 2012-04-23 MED ORDER — PHENTERMINE HCL 30 MG PO CAPS: 30.0000 mg | ORAL_CAPSULE | ORAL | Status: DC

## 2012-05-02 ENCOUNTER — Other Ambulatory Visit: Payer: Self-pay | Admitting: *Deleted

## 2012-05-02 DIAGNOSIS — E039 Hypothyroidism, unspecified: Secondary | ICD-10-CM

## 2012-05-16 ENCOUNTER — Other Ambulatory Visit (INDEPENDENT_AMBULATORY_CARE_PROVIDER_SITE_OTHER): Payer: 59

## 2012-05-16 DIAGNOSIS — E039 Hypothyroidism, unspecified: Secondary | ICD-10-CM

## 2012-05-16 LAB — TSH: TSH: 0.12 u[IU]/mL — ABNORMAL LOW (ref 0.35–5.50)

## 2012-05-19 ENCOUNTER — Other Ambulatory Visit: Payer: Self-pay | Admitting: Internal Medicine

## 2012-05-19 MED ORDER — LEVOTHYROXINE SODIUM 125 MCG PO TABS: 125.0000 ug | ORAL_TABLET | Freq: Every day | ORAL | Status: DC

## 2012-05-19 NOTE — Progress Notes (Signed)
Quick Note:  Spoke with pt- informed of lab and med change - has appt this thursda y for 4 mos rov - will make lab appt at that time. Med [facility] ______

## 2012-05-22 ENCOUNTER — Encounter: Payer: Self-pay | Admitting: Internal Medicine

## 2012-05-22 ENCOUNTER — Ambulatory Visit (INDEPENDENT_AMBULATORY_CARE_PROVIDER_SITE_OTHER): Payer: 59 | Admitting: Internal Medicine

## 2012-05-22 VITALS — BP 118/70 | Temp 98.3°F | Wt 158.0 lb

## 2012-05-22 DIAGNOSIS — E039 Hypothyroidism, unspecified: Secondary | ICD-10-CM

## 2012-05-22 DIAGNOSIS — H6120 Impacted cerumen, unspecified ear: Secondary | ICD-10-CM

## 2012-05-22 DIAGNOSIS — R918 Other nonspecific abnormal finding of lung field: Secondary | ICD-10-CM

## 2012-05-22 DIAGNOSIS — I10 Essential (primary) hypertension: Secondary | ICD-10-CM

## 2012-05-22 MED ORDER — PHENTERMINE HCL 30 MG PO CAPS: 30.0000 mg | ORAL_CAPSULE | ORAL | Status: DC

## 2012-05-22 NOTE — Patient Instructions (Signed)
Limit your sodium (Salt) intake  Please check your blood pressure on a regular basis.  If it is consistently greater than 150/90, please make an office appointment.  Return in 6 months for follow-up   

## 2012-05-22 NOTE — Progress Notes (Signed)
  Subjective:    Patient ID: Katelyn [name], female    DOB: [birth year], 56 y.o.   MRN: [id number]  HPI 56 year old patient who has a history of hypothyroidism since age 8. She has had some fluctuating TSH levels in part related to generic substitution. She is now back on her standard dose of Synthroid and does quite well. No concerns or complaints except for diminished auditory acuity on the left    Review of Systems  Constitutional: Negative.   HENT: Positive for hearing loss. Negative for congestion, sore throat, rhinorrhea, dental problem, sinus pressure and tinnitus.   Eyes: Negative for pain, discharge and visual disturbance.  Respiratory: Negative for cough and shortness of breath.   Cardiovascular: Negative for chest pain, palpitations and leg swelling.  Gastrointestinal: Negative for nausea, vomiting, abdominal pain, diarrhea, constipation, blood in stool and abdominal distention.  Genitourinary: Negative for dysuria, urgency, frequency, hematuria, flank pain, vaginal bleeding, vaginal discharge, difficulty urinating, vaginal pain and pelvic pain.  Musculoskeletal: Negative for joint swelling, arthralgias and gait problem.  Skin: Negative for rash.  Neurological: Negative for dizziness, syncope, speech difficulty, weakness, numbness and headaches.  Hematological: Negative for adenopathy.  Psychiatric/Behavioral: Negative for behavioral problems, dysphoric mood and agitation. The patient is not nervous/anxious.        Objective:   Physical Exam  Constitutional: She is oriented to person, place, and time. She appears well-developed and well-nourished.  HENT:  Head: Normocephalic.  Right Ear: External ear normal.  Left Ear: External ear normal.  Mouth/Throat: Oropharynx is clear and moist.  Eyes: Conjunctivae and EOM are normal. Pupils are equal, round, and reactive to light.  Neck: Normal range of motion. Neck supple. No thyromegaly present.  Cardiovascular: Normal rate,  regular rhythm, normal heart sounds and intact distal pulses.   Pulmonary/Chest: Effort normal and breath sounds normal.  Abdominal: Soft. Bowel sounds are normal. She exhibits no mass. There is no tenderness.  Musculoskeletal: Normal range of motion.  Lymphadenopathy:    She has no cervical adenopathy.  Neurological: She is alert and oriented to person, place, and time.  Skin: Skin is warm and dry. No rash noted.  Psychiatric: She has a normal mood and affect. Her behavior is normal.          Assessment & Plan:  Hypothyroidism. Continue proprietary Synthroid 0.125 Left cerumen impaction. Will irrigate to clear History of pulmonary nodules. We'll check a followup chest CT as recommended  Recheck 6 months

## 2012-05-28 ENCOUNTER — Ambulatory Visit (INDEPENDENT_AMBULATORY_CARE_PROVIDER_SITE_OTHER)
Admission: RE | Admit: 2012-05-28 | Discharge: 2012-05-28 | Disposition: A | Discharge status: Home / Self Care | Payer: 59 | Source: Ambulatory Visit | Attending: Internal Medicine | Admitting: Internal Medicine

## 2012-05-28 DIAGNOSIS — R918 Other nonspecific abnormal finding of lung field: Secondary | ICD-10-CM

## 2012-05-30 NOTE — Progress Notes (Signed)
Quick Note:  Attempt to call hm and cell- VM at both - LMTCB if questions - CT normal ______

## 2012-11-10 ENCOUNTER — Telehealth: Payer: Self-pay | Admitting: Internal Medicine

## 2012-11-10 NOTE — Telephone Encounter (Signed)
[facility] Triage Call Report Triage Record Num: [id number] Operator: Reino Bellis Patient Name: Katelyn [name] Call Date & Time: 11/08/2012 7:15:06PM Patient Phone: [phone] PCP: Gordy Savers Patient Gender: Female PCP Fax : [phone] Patient DOB: Jul 30, 1956 [facility] Name: [facility] Reason for Call: Caller: Idabelle/Patient; PCP: Eleonore Chiquito [facility]); CB#: [phone]; Call regarding Flu sx s - wants rx called in - missed nurse call; Pt. calling about cough, chest tightness, fever, and chills. Onset: 11/08/12. Last temp: 102 (O) @ 5:45 pm. Pt. took Aleve with some relief. Pt. reports her grandson was diagnosed with the flu yesterday (11/07/12) and she has been around him. Pt. request something (medication) be called in. Emergent sxs of breathing problems positive per Flu-like symptoms. Advised pt. to go to ED for evaluation. Care advice given. Pt. notified per standing orders no RX orders for flu symptoms/pt. needs evaluation. Pt. verbalizes understanding. Protocol(s) Used: Flu-Like Symptoms Recommended Outcome per Protocol: See ED Immediately Reason for Outcome: Breathing problems Care Advice: ~ Another adult should drive. Call EMS 911 if sudden onset or sudden worsening of breathing problems, struggling to breathe, high pitched noise when breathing in (stridor), unable to speak, grasping at throat, or panic/anxiety because of breathing problems. ~ ~ IMMEDIATE ACTION Write down provider's name. List or place the following in a bag for transport with the patient: current prescription and/or nonprescription medications; alternative treatments, therapies and medications; and street drugs. ~ Influenza Respiratory Hygiene: - Cover the nose/mouth tightly with a tissue when coughing or sneezing. - Use tissue one time and discard in the nearest waste receptacle. - Wash hands with soap and water or alcohol-based hand rub for at least 15 seconds  after coming into contact with respiratory secretions and contaminated objects/materials. - When a tissue is not available, cough into the bend of the elbow. - Avoid touching your eyes, nose or mouth. - When ill wear a disposable face mask when around others, especially around those at high risk for flu-related complications, to help decrease the likelihood of infecting them. ~ 11/08/2012 7:25:38PM Page 1 of 1 [email or web address]

## 2012-11-11 ENCOUNTER — Other Ambulatory Visit: Payer: Self-pay | Admitting: Internal Medicine

## 2012-11-11 MED ORDER — HYDROCODONE-HOMATROPINE 5-1.5 MG/5ML PO SYRP: 5.0000 mL | ORAL_SOLUTION | Freq: Four times a day (QID) | ORAL | Status: DC | PRN

## 2012-11-11 NOTE — Telephone Encounter (Signed)
Acute bronchitis symptoms for less than 10 days are generally not helped by antibiotics.  Take over-the-counter expectorants and cough medications such as  Mucinex DM.  Call if there is no improvement in 5 to 7 days or if he developed worsening cough, fever, or new symptoms, such as shortness of breath or chest pain.  Okay to call in generic Hydromet 6 ounces 1 teaspoon every 6 hours as needed for cough

## 2012-11-11 NOTE — Telephone Encounter (Signed)
Pt has tried several OTC no relief

## 2012-11-11 NOTE — Telephone Encounter (Signed)
Spoke to pt told her Rx sent to pharmacy for cough medication and to take Mucinex DM OTC. Pt stated she has tried OTC medications and is not helping and wanted to be seen today but no appointments. Told pt sorry but will see her Thurs. Will have the schedulers call her first thing Thurs morning to get her in. Pt verbalized understanding.

## 2012-11-11 NOTE — Telephone Encounter (Signed)
Pt just got back in from out of town. Has developed chest congestion.  Would like to know if you could call in Keflex . Pharm [facility] [address] [phone])

## 2012-11-13 ENCOUNTER — Ambulatory Visit (INDEPENDENT_AMBULATORY_CARE_PROVIDER_SITE_OTHER): Payer: 59 | Admitting: Internal Medicine

## 2012-11-13 ENCOUNTER — Ambulatory Visit (INDEPENDENT_AMBULATORY_CARE_PROVIDER_SITE_OTHER)
Admission: RE | Admit: 2012-11-13 | Discharge: 2012-11-13 | Disposition: A | Discharge status: Home / Self Care | Payer: 59 | Source: Ambulatory Visit | Attending: Internal Medicine | Admitting: Internal Medicine

## 2012-11-13 ENCOUNTER — Encounter: Payer: Self-pay | Admitting: Internal Medicine

## 2012-11-13 VITALS — BP 130/80 | HR 100 | Temp 98.8°F | Resp 20 | Wt 165.0 lb

## 2012-11-13 DIAGNOSIS — J069 Acute upper respiratory infection, unspecified: Secondary | ICD-10-CM

## 2012-11-13 DIAGNOSIS — R059 Cough, unspecified: Secondary | ICD-10-CM

## 2012-11-13 DIAGNOSIS — R05 Cough: Secondary | ICD-10-CM

## 2012-11-13 DIAGNOSIS — I10 Essential (primary) hypertension: Secondary | ICD-10-CM

## 2012-11-13 MED ORDER — PHENTERMINE HCL 30 MG PO CAPS: 30.0000 mg | ORAL_CAPSULE | ORAL | Status: DC

## 2012-11-13 MED ORDER — CEPHALEXIN 500 MG PO CAPS: 500.0000 mg | ORAL_CAPSULE | Freq: Four times a day (QID) | ORAL | Status: DC

## 2012-11-13 MED ORDER — HYDROCODONE-HOMATROPINE 5-1.5 MG/5ML PO SYRP: 5.0000 mL | ORAL_SOLUTION | Freq: Four times a day (QID) | ORAL | Status: AC | PRN

## 2012-11-13 NOTE — Progress Notes (Signed)
Subjective:    Patient ID: Katelyn [name], female    DOB: [birth year], 58 y.o.   MRN: [id number]  HPI  57 year old patient who presents with a six-day history of cough she is worsening chest congestion and earlier in the illness had fever 204. Cough is quite severe but nonproductive. She feels unwell. She's been taking Aleve for fever control. She has a number of antibiotic allergies but can take Keflex. She was exposed to a number of family members over the holidays with acute illness  Past Medical History  Diagnosis Date  . HYPOTHYROIDISM 05/27/2007  . WEIGHT GAIN 07/15/2009  . PONV (postoperative nausea and vomiting)   . Headache     cluster headaches, migraines- states is taking Atenolol at present as directed per Dr Wynelle Cleveland  to prevent migraine from increased stress-  states if on medication for several weeks that heart rate will drop to 40's  . Pneumonia     post op x 2  . Anemia   . Chronic kidney disease     stones  . Cancer     pelvic mass    History   Social History  . Marital Status: Married    Spouse Name: N/A    Number of Children: N/A  . Years of Education: N/A   Occupational History  . Not on file.   Social History Main Topics  . Smoking status: Never Smoker   . Smokeless tobacco: Never Used  . Alcohol Use: No  . Drug Use: No  . Sexually Active: Yes   Other Topics Concern  . Not on file   Social History Narrative  . No narrative on file    Past Surgical History  Procedure Date  . Abdominal hysterectomy   . Appendectomy   . Tonsillectomy   . Tympanostomy tube placement   . Foot surgery 2007    tumor removed from right foot/    removal Mortons neuroma    years ago  . Knee arthroscopy     x 2  . Tubal ligation   . Diagnostic laparoscopy   . Laparotomy 10/30/2011    Procedure: EXPLORATORY LAPAROTOMY;  Surgeon: Laurette Schimke, MD;  Location: [facility];  Service: Gynecology;  Laterality: N/A;  . Salpingoophorectomy 10/30/2011    Procedure:  SALPINGO OOPHERECTOMY;  Surgeon: Laurette Schimke, MD;  Location: [facility];  Service: Gynecology;  Laterality: Right;  RIGHT    Family History  Problem Relation Age of Onset  . Hypertension Neg Hx     family hx  . Heart disease Neg Hx     family hx  . Alzheimer's disease Mother   . Cervical cancer Mother   . Diabetes Sister   . Heart attack Brother     Allergies  Allergen Reactions  . Dimethicone Hives and Shortness Of Breath    Nitrile gloves,  . Latex Hives and Shortness Of Breath  . Penicillins Hives and Shortness Of Breath  . Shellfish Allergy Hives and Shortness Of Breath  . Sulfonamide Derivatives Hives, Shortness Of Breath and Swelling  . Adhesive (Tape) Swelling  . Alcohol-Sulfur (Sulfur) Swelling  . Aspirin Nausea And Vomiting  . Ciprofloxacin Hives, Swelling and Other (See Comments)    Like hand was on fire  . Doxycycline Nausea And Vomiting  . Erythromycin Swelling  . Red Dye Swelling    Current Outpatient Prescriptions on File Prior to Visit  Medication Sig Dispense Refill  . atenolol (TENORMIN) 25 MG tablet Take 25 mg by mouth every  morning. For migraines      . celecoxib (CELEBREX) 200 MG capsule Take 200 mg by mouth. Took 1 pill 10/13/11       . cholecalciferol (VITAMIN D) 1000 UNITS tablet Take 1,000 Units by mouth daily.       . Cyanocobalamin (VITAMIN B-12) 2500 MCG SUBL Place 1 tablet under the tongue daily.       . fluticasone (FLONASE) 50 MCG/ACT nasal spray Place 2 sprays into the nose daily.  16 g  6  . HYDROcodone-homatropine (HYCODAN) 5-1.5 MG/5ML syrup Take 5 mLs by mouth every 6 (six) hours as needed for cough.  120 mL  0  . levothyroxine (SYNTHROID, LEVOTHROID) 125 MCG tablet Take 1 tablet (125 mcg total) by mouth daily.  90 tablet  1  . phentermine 30 MG capsule Take 1 capsule (30 mg total) by mouth every morning.  90 capsule  1    BP 130/80  Pulse 100  Temp 98.8 F (37.1 C) (Oral)  Resp 20  Wt 165 lb (74.844 kg)  SpO2 94%       Review  of Systems  Constitutional: Positive for fever, appetite change and fatigue.  HENT: Positive for congestion. Negative for hearing loss, sore throat, rhinorrhea, dental problem, sinus pressure and tinnitus.   Eyes: Negative for pain, discharge and visual disturbance.  Respiratory: Positive for cough. Negative for shortness of breath.   Cardiovascular: Negative for chest pain, palpitations and leg swelling.  Gastrointestinal: Negative for nausea, vomiting, abdominal pain, diarrhea, constipation, blood in stool and abdominal distention.  Genitourinary: Negative for dysuria, urgency, frequency, hematuria, flank pain, vaginal bleeding, vaginal discharge, difficulty urinating, vaginal pain and pelvic pain.  Musculoskeletal: Negative for joint swelling, arthralgias and gait problem.  Skin: Negative for rash.  Neurological: Negative for dizziness, syncope, speech difficulty, weakness, numbness and headaches.  Hematological: Negative for adenopathy.  Psychiatric/Behavioral: Negative for behavioral problems, dysphoric mood and agitation. The patient is not nervous/anxious.        Objective:   Physical Exam  Constitutional: She is oriented to person, place, and time. She appears well-developed and well-nourished.  HENT:  Head: Normocephalic.  Right Ear: External ear normal.  Left Ear: External ear normal.  Mouth/Throat: Oropharynx is clear and moist.  Eyes: Conjunctivae normal and EOM are normal. Pupils are equal, round, and reactive to light.  Neck: Normal range of motion. Neck supple. No thyromegaly present.  Cardiovascular: Normal rate, regular rhythm, normal heart sounds and intact distal pulses.   Pulmonary/Chest: Effort normal. No respiratory distress.       Scattered coarse rhonchi Few crackles right base  Abdominal: Soft. Bowel sounds are normal. She exhibits no mass. There is no tenderness.  Musculoskeletal: Normal range of motion.  Lymphadenopathy:    She has no cervical adenopathy.    Neurological: She is alert and oriented to person, place, and time.  Skin: Skin is warm and dry. No rash noted.  Psychiatric: She has a normal mood and affect. Her behavior is normal.          Assessment & Plan:   Acute bronchitis. Will check a chest x-ray to exclude pneumonia. We'll give a prescription for Keflex but the patient will fill and use only if the pneumonia is documented. We'll treat symptomatically

## 2012-11-13 NOTE — Patient Instructions (Signed)
Acute bronchitis symptoms for less than 10 days are generally not helped by antibiotics.  Take over-the-counter expectorants and cough medications such as  Mucinex DM.  Call if there is no improvement in 5 to 7 days or if he developed worsening cough, fever, or new symptoms, such as shortness of breath or chest pain.  Chest x-ray as discussed

## 2012-11-21 ENCOUNTER — Encounter: Payer: Self-pay | Admitting: Internal Medicine

## 2012-11-21 ENCOUNTER — Ambulatory Visit (INDEPENDENT_AMBULATORY_CARE_PROVIDER_SITE_OTHER): Payer: 59 | Admitting: Internal Medicine

## 2012-11-21 VITALS — BP 124/80 | HR 84 | Temp 99.4°F | Wt 157.0 lb

## 2012-11-21 DIAGNOSIS — E039 Hypothyroidism, unspecified: Secondary | ICD-10-CM

## 2012-11-21 DIAGNOSIS — J189 Pneumonia, unspecified organism: Secondary | ICD-10-CM

## 2012-11-21 DIAGNOSIS — J069 Acute upper respiratory infection, unspecified: Secondary | ICD-10-CM

## 2012-11-21 MED ORDER — HYDROCODONE-ACETAMINOPHEN 5-300 MG PO TABS: 1.0000 | ORAL_TABLET | Freq: Four times a day (QID) | ORAL | Status: DC

## 2012-11-21 NOTE — Patient Instructions (Addendum)
Take over-the-counter expectorants and cough medications such as  Mucinex DM.  Call if there is no improvement in 5 to 7 days or if he developed worsening cough, fever, or new symptoms, such as shortness of breath or chest pain.  Drink as much fluid as you  can tolerate over the next few days

## 2012-11-21 NOTE — Progress Notes (Signed)
Subjective:    Patient ID: Katelyn [name], female    DOB: 1955-11-28, 57 y.o.   MRN: [id number]  HPI  57 year old patient who is seen today in followup. She was seen about 8 days ago and chest x-ray was suggestive of a left lingular pneumonia. She is on antibiotic therapy with cephalexin at this time and is improved;  she continues to have considerable cough but improved and she is able to sleep through the night. She has some occasional low-grade fever. She has been unable to work since December 27. She has multiple antibiotic allergies but has tolerated cephalexin without difficulties.  Past Medical History  Diagnosis Date  . HYPOTHYROIDISM 05/27/2007  . WEIGHT GAIN 07/15/2009  . PONV (postoperative nausea and vomiting)   . Headache     cluster headaches, migraines- states is taking Atenolol at present as directed per Dr Wynelle Cleveland  to prevent migraine from increased stress-  states if on medication for several weeks that heart rate will drop to 40's  . Pneumonia     post op x 2  . Anemia   . Chronic kidney disease     stones  . Cancer     pelvic mass    History   Social History  . Marital Status: Married    Spouse Name: N/A    Number of Children: N/A  . Years of Education: N/A   Occupational History  . Not on file.   Social History Main Topics  . Smoking status: Never Smoker   . Smokeless tobacco: Never Used  . Alcohol Use: No  . Drug Use: No  . Sexually Active: Yes   Other Topics Concern  . Not on file   Social History Narrative  . No narrative on file    Past Surgical History  Procedure Date  . Abdominal hysterectomy   . Appendectomy   . Tonsillectomy   . Tympanostomy tube placement   . Foot surgery 2007    tumor removed from right foot/    removal Mortons neuroma    years ago  . Knee arthroscopy     x 2  . Tubal ligation   . Diagnostic laparoscopy   . Laparotomy 10/30/2011    Procedure: EXPLORATORY LAPAROTOMY;  Surgeon: Laurette Schimke, MD;  Location:  [facility];  Service: Gynecology;  Laterality: N/A;  . Salpingoophorectomy 10/30/2011    Procedure: SALPINGO OOPHERECTOMY;  Surgeon: Laurette Schimke, MD;  Location: [facility];  Service: Gynecology;  Laterality: Right;  RIGHT    Family History  Problem Relation Age of Onset  . Hypertension Neg Hx     family hx  . Heart disease Neg Hx     family hx  . Alzheimer's disease Mother   . Cervical cancer Mother   . Diabetes Sister   . Heart attack Brother     Allergies  Allergen Reactions  . Dimethicone Hives and Shortness Of Breath    Nitrile gloves,  . Latex Hives and Shortness Of Breath  . Penicillins Hives and Shortness Of Breath  . Shellfish Allergy Hives and Shortness Of Breath  . Sulfonamide Derivatives Hives, Shortness Of Breath and Swelling  . Adhesive (Tape) Swelling  . Alcohol-Sulfur (Sulfur) Swelling  . Aspirin Nausea And Vomiting  . Ciprofloxacin Hives, Swelling and Other (See Comments)    Like hand was on fire  . Doxycycline Nausea And Vomiting  . Erythromycin Swelling  . Red Dye Swelling    Current Outpatient Prescriptions on File Prior to Visit  Medication Sig Dispense Refill  . atenolol (TENORMIN) 25 MG tablet Take 25 mg by mouth every morning. For migraines      . celecoxib (CELEBREX) 200 MG capsule Take 200 mg by mouth. Took 1 pill 10/13/11       . cephALEXin (KEFLEX) 500 MG capsule Take 1 capsule (500 mg total) by mouth 4 (four) times daily.  40 capsule  0  . cholecalciferol (VITAMIN D) 1000 UNITS tablet Take 1,000 Units by mouth daily.       . Cyanocobalamin (VITAMIN B-12) 2500 MCG SUBL Place 1 tablet under the tongue daily.       . fluticasone (FLONASE) 50 MCG/ACT nasal spray Place 2 sprays into the nose daily.  16 g  6  . HYDROcodone-homatropine (HYCODAN) 5-1.5 MG/5ML syrup Take 5 mLs by mouth every 6 (six) hours as needed for cough.  120 mL  0  . HYDROcodone-homatropine (HYCODAN) 5-1.5 MG/5ML syrup Take 5 mLs by mouth every 6 (six) hours as needed for cough.  120 mL   0  . levothyroxine (SYNTHROID, LEVOTHROID) 125 MCG tablet Take 1 tablet (125 mcg total) by mouth daily.  90 tablet  1  . phentermine 30 MG capsule Take 1 capsule (30 mg total) by mouth every morning.  90 capsule  1    BP 124/80  Pulse 84  Temp 99.4 F (37.4 C) (Oral)  Wt 157 lb (71.215 kg)  SpO2 98%       Review of Systems  Constitutional: Positive for fatigue.  HENT: Negative for hearing loss, congestion, sore throat, rhinorrhea, dental problem, sinus pressure and tinnitus.   Eyes: Negative for pain, discharge and visual disturbance.  Respiratory: Positive for cough. Negative for shortness of breath.   Cardiovascular: Negative for chest pain, palpitations and leg swelling.  Gastrointestinal: Negative for nausea, vomiting, abdominal pain, diarrhea, constipation, blood in stool and abdominal distention.  Genitourinary: Negative for dysuria, urgency, frequency, hematuria, flank pain, vaginal bleeding, vaginal discharge, difficulty urinating, vaginal pain and pelvic pain.  Musculoskeletal: Negative for joint swelling, arthralgias and gait problem.  Skin: Negative for rash.  Neurological: Positive for weakness. Negative for dizziness, syncope, speech difficulty, numbness and headaches.  Hematological: Negative for adenopathy.  Psychiatric/Behavioral: Negative for behavioral problems, dysphoric mood and agitation. The patient is not nervous/anxious.        Objective:   Physical Exam  Constitutional: She is oriented to person, place, and time. She appears well-developed and well-nourished.  HENT:  Head: Normocephalic.  Right Ear: External ear normal.  Left Ear: External ear normal.  Mouth/Throat: Oropharynx is clear and moist.  Eyes: Conjunctivae normal and EOM are normal. Pupils are equal, round, and reactive to light.  Neck: Normal range of motion. Neck supple. No thyromegaly present.  Cardiovascular: Normal rate, regular rhythm, normal heart sounds and intact distal pulses.     Pulmonary/Chest: Effort normal and breath sounds normal. No respiratory distress. She has no wheezes. She has no rales.       O2 saturation 98%  Abdominal: Soft. Bowel sounds are normal. She exhibits no mass. There is no tenderness.  Musculoskeletal: Normal range of motion.  Lymphadenopathy:    She has no cervical adenopathy.  Neurological: She is alert and oriented to person, place, and time.  Skin: Skin is warm and dry. No rash noted.  Psychiatric: She has a normal mood and affect. Her behavior is normal.          Assessment & Plan:   Resolving the community acquired pneumonia.  We'll complete antibiotic therapy we'll continue antitussives and expectorants. Force fluids rest the weekend. She is requesting a note for work and wishes to try return to work on Monday, January 13 Hypothyroidism. TSH will be checked next  visit

## 2013-06-21 ENCOUNTER — Other Ambulatory Visit: Payer: Self-pay | Admitting: Internal Medicine

## 2013-09-21 ENCOUNTER — Other Ambulatory Visit: Payer: Self-pay | Admitting: Internal Medicine

## 2014-05-08 ENCOUNTER — Other Ambulatory Visit: Payer: Self-pay | Admitting: Internal Medicine

## 2014-05-19 ENCOUNTER — Other Ambulatory Visit: Payer: Self-pay | Admitting: Internal Medicine

## 2014-05-28 ENCOUNTER — Encounter: Payer: Self-pay | Admitting: Internal Medicine

## 2014-05-28 ENCOUNTER — Ambulatory Visit (INDEPENDENT_AMBULATORY_CARE_PROVIDER_SITE_OTHER): Payer: 59 | Admitting: Internal Medicine

## 2014-05-28 ENCOUNTER — Telehealth: Payer: Self-pay | Admitting: Internal Medicine

## 2014-05-28 VITALS — BP 130/80 | HR 79 | Temp 98.3°F | Resp 20 | Ht 67.0 in | Wt 160.0 lb

## 2014-05-28 DIAGNOSIS — I10 Essential (primary) hypertension: Secondary | ICD-10-CM

## 2014-05-28 DIAGNOSIS — R911 Solitary pulmonary nodule: Secondary | ICD-10-CM

## 2014-05-28 DIAGNOSIS — E039 Hypothyroidism, unspecified: Secondary | ICD-10-CM

## 2014-05-28 DIAGNOSIS — N200 Calculus of kidney: Secondary | ICD-10-CM

## 2014-05-28 MED ORDER — HYDROCODONE-ACETAMINOPHEN 5-300 MG PO TABS: 1.0000 | ORAL_TABLET | Freq: Four times a day (QID) | ORAL | Status: DC | PRN

## 2014-05-28 MED ORDER — PHENTERMINE HCL 30 MG PO CAPS: ORAL_CAPSULE | ORAL | Status: DC

## 2014-05-28 MED ORDER — CELECOXIB 200 MG PO CAPS: 200.0000 mg | ORAL_CAPSULE | Freq: Every day | ORAL | Status: DC

## 2014-05-28 MED ORDER — SYNTHROID 125 MCG PO TABS: ORAL_TABLET | ORAL | Status: DC

## 2014-05-28 MED ORDER — ALBUTEROL SULFATE HFA 108 (90 BASE) MCG/ACT IN AERS: 2.0000 | INHALATION_SPRAY | Freq: Four times a day (QID) | RESPIRATORY_TRACT | Status: DC | PRN

## 2014-05-28 MED ORDER — ATENOLOL 25 MG PO TABS: 25.0000 mg | ORAL_TABLET | ORAL | Status: DC

## 2014-05-28 NOTE — Telephone Encounter (Signed)
Pt wants to have CPX labs drawn at [facility].  Can you please enter the orders?  She will callback in December to have the orders printed out and ready for pick-up.

## 2014-05-28 NOTE — Progress Notes (Signed)
Pre visit review using our clinic review tool, if applicable. No additional management support is needed unless otherwise documented below in the visit note. 

## 2014-05-28 NOTE — Progress Notes (Signed)
Subjective:    Patient ID: Katelyn [name], female    DOB: 1955/12/09, 58 y.o.   MRN: [id number]  HPI  58 year old patient who has not been seen in over one year.  She has treated hypertension, hypothyroidism, and a history of recurrent renal colic.  She also has a history of a pulmonary nodule.  She is a lifelong nonsmoker. She has done well today and basically here for refill of her medications.  She has had a recent viral gastroenteritis.  That resolved yesterday.  In general do well.  Past Medical History  Diagnosis Date  . HYPOTHYROIDISM 05/27/2007  . WEIGHT GAIN 07/15/2009  . PONV (postoperative nausea and vomiting)   . Headache(784.0)     cluster headaches, migraines- states is taking Atenolol at present as directed per Dr Lacinda Axon  to prevent migraine from increased stress-  states if on medication for several weeks that heart rate will drop to 40's  . Pneumonia     post op x 2  . Anemia   . Chronic kidney disease     stones  . Cancer     pelvic mass    History   Social History  . Marital Status: Married    Spouse Name: N/A    Number of Children: N/A  . Years of Education: N/A   Occupational History  . Not on file.   Social History Main Topics  . Smoking status: Never Smoker   . Smokeless tobacco: Never Used  . Alcohol Use: No  . Drug Use: No  . Sexual Activity: Yes   Other Topics Concern  . Not on file   Social History Narrative  . No narrative on file    Past Surgical History  Procedure Laterality Date  . Abdominal hysterectomy    . Appendectomy    . Tonsillectomy    . Tympanostomy tube placement    . Foot surgery  2007    tumor removed from right foot/    removal Mortons neuroma    years ago  . Knee arthroscopy      x 2  . Tubal ligation    . Diagnostic laparoscopy    . Laparotomy  10/30/2011    Procedure: EXPLORATORY LAPAROTOMY;  Surgeon: Janie Morning, MD;  Location: [facility];  Service: Gynecology;  Laterality: N/A;  . Salpingoophorectomy   10/30/2011    Procedure: SALPINGO OOPHERECTOMY;  Surgeon: Janie Morning, MD;  Location: [facility];  Service: Gynecology;  Laterality: Right;  RIGHT    Family History  Problem Relation Age of Onset  . Hypertension Neg Hx     family hx  . Heart disease Neg Hx     family hx  . Alzheimer's disease Mother   . Cervical cancer Mother   . Diabetes Sister   . Heart attack Brother     Allergies  Allergen Reactions  . Dimethicone Hives and Shortness Of Breath    Nitrile gloves,  . Latex Hives and Shortness Of Breath  . Penicillins Hives and Shortness Of Breath  . Shellfish Allergy Hives and Shortness Of Breath  . Sulfonamide Derivatives Hives, Shortness Of Breath and Swelling  . Adhesive [Tape] Swelling  . Alcohol-Sulfur [Sulfur] Swelling  . Aspirin Nausea And Vomiting  . Ciprofloxacin Hives, Swelling and Other (See Comments)    Like hand was on fire  . Doxycycline Nausea And Vomiting  . Erythromycin Swelling  . Red Dye Swelling    Current Outpatient Prescriptions on File Prior to Visit  Medication Sig Dispense Refill  . cholecalciferol (VITAMIN D) 1000 UNITS tablet Take 1,000 Units by mouth daily.       . Cyanocobalamin (VITAMIN B-12) 2500 MCG SUBL Place 1 tablet under the tongue daily.        No current facility-administered medications on file prior to visit.    BP 130/80  Pulse 79  Temp(Src) 98.3 F (36.8 C) (Oral)  Resp 20  Ht 5\' 7"  (1.702 m)  Wt 160 lb (72.576 kg)  BMI 25.05 kg/m2  SpO2 98%     Review of Systems  Constitutional: Negative.   HENT: Negative for congestion, dental problem, hearing loss, rhinorrhea, sinus pressure, sore throat and tinnitus.   Eyes: Negative for pain, discharge and visual disturbance.  Respiratory: Negative for cough and shortness of breath.   Cardiovascular: Negative for chest pain, palpitations and leg swelling.  Gastrointestinal: Negative for nausea, vomiting, abdominal pain, diarrhea, constipation, blood in stool and abdominal  distention.  Genitourinary: Negative for dysuria, urgency, frequency, hematuria, flank pain, vaginal bleeding, vaginal discharge, difficulty urinating, vaginal pain and pelvic pain.  Musculoskeletal: Negative for arthralgias, gait problem and joint swelling.  Skin: Negative for rash.  Neurological: Negative for dizziness, syncope, speech difficulty, weakness, numbness and headaches.  Hematological: Negative for adenopathy.  Psychiatric/Behavioral: Negative for behavioral problems, dysphoric mood and agitation. The patient is not nervous/anxious.        Objective:   Physical Exam  Constitutional: She is oriented to person, place, and time. She appears well-developed and well-nourished.  Blood pressure 130/80  HENT:  Head: Normocephalic.  Right Ear: External ear normal.  Left Ear: External ear normal.  Mouth/Throat: Oropharynx is clear and moist.  Eyes: Conjunctivae and EOM are normal. Pupils are equal, round, and reactive to light.  Neck: Normal range of motion. Neck supple. No thyromegaly present.  Cardiovascular: Normal rate, regular rhythm, normal heart sounds and intact distal pulses.   Pulmonary/Chest: Effort normal and breath sounds normal.  Abdominal: Soft. Bowel sounds are normal. She exhibits no mass. There is no tenderness.  Musculoskeletal: Normal range of motion.  Lymphadenopathy:    She has no cervical adenopathy.  Neurological: She is alert and oriented to person, place, and time.  Skin: Skin is warm and dry. No rash noted.  Psychiatric: She has a normal mood and affect. Her behavior is normal.          Assessment & Plan:  Hypertension well controlled Nephrolithiasis.  We'll force fluids.  Patient has had 2 episodes of renal colic.  Over the past 6 months Hypothyroidism.  No recent lab.  We'll schedule CPX History pulmonary nodule.  We'll check a followup chest x-ray

## 2014-05-28 NOTE — Patient Instructions (Signed)
Limit your sodium (Salt) intake    It is important that you exercise regularly, at least 20 minutes 3 to 4 times per week.  If you develop chest pain or shortness of breath seek  medical attention.  Please check your blood pressure on a regular basis.  If it is consistently greater than 150/90, please make an office appointment.  Return in 6 months for follow-up   

## 2014-06-01 NOTE — Telephone Encounter (Signed)
Pt will call back for CPX lab orders to pickup in December.

## 2014-06-07 ENCOUNTER — Ambulatory Visit (INDEPENDENT_AMBULATORY_CARE_PROVIDER_SITE_OTHER)
Admission: RE | Admit: 2014-06-07 | Discharge: 2014-06-07 | Disposition: A | Discharge status: Home / Self Care | Payer: 59 | Source: Ambulatory Visit | Attending: Internal Medicine | Admitting: Internal Medicine

## 2014-06-07 ENCOUNTER — Encounter: Payer: Self-pay | Admitting: Family Medicine

## 2014-06-07 ENCOUNTER — Ambulatory Visit (INDEPENDENT_AMBULATORY_CARE_PROVIDER_SITE_OTHER): Payer: 59 | Admitting: Family Medicine

## 2014-06-07 VITALS — BP 132/92 | HR 95 | Temp 98.8°F | Ht 67.0 in | Wt 160.0 lb

## 2014-06-07 DIAGNOSIS — M545 Low back pain, unspecified: Secondary | ICD-10-CM

## 2014-06-07 DIAGNOSIS — M79609 Pain in unspecified limb: Secondary | ICD-10-CM

## 2014-06-07 DIAGNOSIS — M79675 Pain in left toe(s): Secondary | ICD-10-CM

## 2014-06-07 DIAGNOSIS — R911 Solitary pulmonary nodule: Secondary | ICD-10-CM

## 2014-06-07 NOTE — Progress Notes (Signed)
Pre visit review using our clinic review tool, if applicable. No additional management support is needed unless otherwise documented below in the visit note. 
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
## 2014-11-19 ENCOUNTER — Encounter: Payer: 59 | Admitting: Internal Medicine

## 2014-12-26 ENCOUNTER — Emergency Department (HOSPITAL_COMMUNITY): Payer: 59

## 2014-12-26 ENCOUNTER — Emergency Department (HOSPITAL_COMMUNITY)
Admission: EM | Admit: 2014-12-26 | Discharge: 2014-12-26 | Disposition: A | Discharge status: Home / Self Care | Payer: 59 | Attending: Emergency Medicine | Admitting: Emergency Medicine

## 2014-12-26 ENCOUNTER — Encounter (HOSPITAL_COMMUNITY): Payer: Self-pay | Admitting: Emergency Medicine

## 2014-12-26 DIAGNOSIS — Y9389 Activity, other specified: Secondary | ICD-10-CM | POA: Insufficient documentation

## 2014-12-26 DIAGNOSIS — Z880 Allergy status to penicillin: Secondary | ICD-10-CM | POA: Insufficient documentation

## 2014-12-26 DIAGNOSIS — Y998 Other external cause status: Secondary | ICD-10-CM | POA: Diagnosis not present

## 2014-12-26 DIAGNOSIS — N189 Chronic kidney disease, unspecified: Secondary | ICD-10-CM | POA: Diagnosis not present

## 2014-12-26 DIAGNOSIS — Y92410 Unspecified street and highway as the place of occurrence of the external cause: Secondary | ICD-10-CM | POA: Diagnosis not present

## 2014-12-26 DIAGNOSIS — S29001A Unspecified injury of muscle and tendon of front wall of thorax, initial encounter: Secondary | ICD-10-CM | POA: Insufficient documentation

## 2014-12-26 DIAGNOSIS — Z862 Personal history of diseases of the blood and blood-forming organs and certain disorders involving the immune mechanism: Secondary | ICD-10-CM | POA: Diagnosis not present

## 2014-12-26 DIAGNOSIS — Z79899 Other long term (current) drug therapy: Secondary | ICD-10-CM | POA: Insufficient documentation

## 2014-12-26 DIAGNOSIS — Z8639 Personal history of other endocrine, nutritional and metabolic disease: Secondary | ICD-10-CM | POA: Diagnosis not present

## 2014-12-26 DIAGNOSIS — I10 Essential (primary) hypertension: Secondary | ICD-10-CM

## 2014-12-26 DIAGNOSIS — Z8701 Personal history of pneumonia (recurrent): Secondary | ICD-10-CM | POA: Insufficient documentation

## 2014-12-26 DIAGNOSIS — R079 Chest pain, unspecified: Secondary | ICD-10-CM

## 2014-12-26 DIAGNOSIS — R0789 Other chest pain: Secondary | ICD-10-CM

## 2014-12-26 DIAGNOSIS — I129 Hypertensive chronic kidney disease with stage 1 through stage 4 chronic kidney disease, or unspecified chronic kidney disease: Secondary | ICD-10-CM | POA: Diagnosis not present

## 2014-12-26 MED ORDER — OXYCODONE-ACETAMINOPHEN 10-325 MG PO TABS: 1.0000 | ORAL_TABLET | ORAL | Status: DC | PRN

## 2014-12-26 MED ORDER — METHOCARBAMOL 500 MG PO TABS
500.0000 mg | ORAL_TABLET | Freq: Two times a day (BID) | ORAL | Status: DC
Start: 2014-12-26 — End: 2015-01-10

## 2014-12-26 MED ORDER — OXYCODONE-ACETAMINOPHEN 5-325 MG PO TABS
2.0000 | ORAL_TABLET | Freq: Once | ORAL | Status: AC
  Administered 2014-12-26: 2 via ORAL
  Filled 2014-12-26: qty 2
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
## 2014-12-26 NOTE — ED Notes (Signed)
PA at bedside.
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
## 2014-12-26 NOTE — Discharge Instructions (Signed)
1. Medications: robaxin, percocet, usual home medications 2. Treatment: rest, drink plenty of fluids, gentle stretching as discussed, alternate ice and heat 3. Follow Up: Please followup with your primary doctor in 3 days for discussion of your diagnoses and further evaluation after today's visit; if you do not have a primary care doctor use the resource guide provided to find one;  Return to the ER for worsening chest pain or chest pain associated with diaphoresis, shortness of breath, nausea, syncope or near syncope Or any other reason for concern     Chest Wall Pain Chest wall pain is pain in or around the bones and muscles of your chest. It may take up to 6 weeks to get better. It may take longer if you must stay physically active in your work and activities.  CAUSES  Chest wall pain may happen on its own. However, it may be caused by:  A viral illness like the flu.  Injury.  Coughing.  Exercise.  Arthritis.  Fibromyalgia.  Shingles. HOME CARE INSTRUCTIONS   Avoid overtiring physical activity. Try not to strain or perform activities that cause pain. This includes any activities using your chest or your abdominal and side muscles, especially if heavy weights are used.  Put ice on the sore area.  Put ice in a plastic bag.  Place a towel between your skin and the bag.  Leave the ice on for 15-20 minutes per hour while awake for the first 2 days.  Only take over-the-counter or prescription medicines for pain, discomfort, or fever as directed by your caregiver. SEEK IMMEDIATE MEDICAL CARE IF:   Your pain increases, or you are very uncomfortable.  You have a fever.  Your chest pain becomes worse.  You have new, unexplained symptoms.  You have nausea or vomiting.  You feel sweaty or lightheaded.  You have a cough with phlegm (sputum), or you cough up blood. MAKE SURE YOU:   Understand these instructions.  Will watch your condition.  Will get help right away if  you are not doing well or get worse. Document Released: 10/29/2005 Document Revised: 01/21/2012 Document Reviewed: 06/25/2011 [facility] Patient Information 2015 [place], [place]. This information is not intended to replace advice given to you by your health care provider. Make sure you discuss any questions you have with your health care provider.

## 2014-12-28 ENCOUNTER — Telehealth: Payer: Self-pay | Admitting: Internal Medicine
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
## 2014-12-28 NOTE — Telephone Encounter (Signed)
Please add pt to the schedule this week, tomorrow if any openings.

## 2014-12-28 NOTE — Telephone Encounter (Signed)
done

## 2014-12-29 ENCOUNTER — Encounter: Payer: Self-pay | Admitting: Internal Medicine

## 2014-12-29 ENCOUNTER — Ambulatory Visit (INDEPENDENT_AMBULATORY_CARE_PROVIDER_SITE_OTHER): Payer: 59 | Admitting: Internal Medicine

## 2014-12-29 VITALS — BP 120/70 | HR 61 | Temp 98.1°F | Resp 20 | Ht 67.0 in | Wt 160.0 lb

## 2014-12-29 DIAGNOSIS — I10 Essential (primary) hypertension: Secondary | ICD-10-CM

## 2014-12-29 DIAGNOSIS — R071 Chest pain on breathing: Secondary | ICD-10-CM

## 2014-12-29 DIAGNOSIS — R0789 Other chest pain: Secondary | ICD-10-CM

## 2014-12-29 MED ORDER — ALPRAZOLAM 0.25 MG PO TABS: 0.2500 mg | ORAL_TABLET | Freq: Three times a day (TID) | ORAL | Status: DC | PRN

## 2014-12-29 MED ORDER — OXYCODONE-ACETAMINOPHEN 10-325 MG PO TABS: 1.0000 | ORAL_TABLET | ORAL | Status: DC | PRN

## 2014-12-29 NOTE — Progress Notes (Signed)
Pre visit review using our clinic review tool, if applicable. No additional management support is needed unless otherwise documented below in the visit note. 
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
## 2014-12-29 NOTE — Patient Instructions (Signed)
SEEK IMMEDIATE MEDICAL CARE IF:  Your pain increases, or you are very uncomfortable.  You have a fever.  Your chest pain becomes worse.  You have new, unexplained symptoms.  You have nausea or vomiting.  You feel sweaty or lightheaded.  You have a cough with phlegm (sputum), or you cough up blood.

## 2014-12-31 ENCOUNTER — Telehealth: Payer: Self-pay | Admitting: Internal Medicine

## 2014-12-31 NOTE — Telephone Encounter (Signed)
emmi mailed  °
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
## 2014-12-31 NOTE — Telephone Encounter (Signed)
Pls advise.  

## 2014-12-31 NOTE — Telephone Encounter (Signed)
Pt aware letter ready for pick up 

## 2014-12-31 NOTE — Telephone Encounter (Signed)
OK-Can you do this?

## 2015-01-10 ENCOUNTER — Ambulatory Visit (INDEPENDENT_AMBULATORY_CARE_PROVIDER_SITE_OTHER)
Admission: RE | Admit: 2015-01-10 | Discharge: 2015-01-10 | Disposition: A | Discharge status: Home / Self Care | Payer: 59 | Source: Ambulatory Visit | Attending: Internal Medicine | Admitting: Internal Medicine

## 2015-01-10 ENCOUNTER — Ambulatory Visit (INDEPENDENT_AMBULATORY_CARE_PROVIDER_SITE_OTHER): Payer: 59 | Admitting: Internal Medicine

## 2015-01-10 ENCOUNTER — Encounter: Payer: Self-pay | Admitting: Internal Medicine

## 2015-01-10 VITALS — BP 150/90 | HR 74 | Temp 98.0°F | Resp 16 | Ht 67.0 in | Wt 162.0 lb

## 2015-01-10 DIAGNOSIS — R0789 Other chest pain: Secondary | ICD-10-CM

## 2015-01-10 DIAGNOSIS — E039 Hypothyroidism, unspecified: Secondary | ICD-10-CM

## 2015-01-10 DIAGNOSIS — N200 Calculus of kidney: Secondary | ICD-10-CM

## 2015-01-10 DIAGNOSIS — R071 Chest pain on breathing: Secondary | ICD-10-CM

## 2015-01-10 DIAGNOSIS — I10 Essential (primary) hypertension: Secondary | ICD-10-CM

## 2015-01-10 MED ORDER — OXYCODONE-ACETAMINOPHEN 10-325 MG PO TABS: 1.0000 | ORAL_TABLET | ORAL | Status: DC | PRN
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
## 2015-01-10 NOTE — Patient Instructions (Signed)
Call or return to clinic prn if these symptoms worsen or fail to improve as anticipated.

## 2015-01-10 NOTE — Progress Notes (Signed)
Pre visit review using our clinic review tool, if applicable. No additional management support is needed unless otherwise documented below in the visit note. 

## 2015-01-25 ENCOUNTER — Encounter: Payer: Self-pay | Admitting: Internal Medicine

## 2015-01-25 ENCOUNTER — Ambulatory Visit (INDEPENDENT_AMBULATORY_CARE_PROVIDER_SITE_OTHER): Payer: 59 | Admitting: Internal Medicine

## 2015-01-25 VITALS — BP 130/80 | HR 69 | Temp 98.3°F | Resp 20 | Ht 67.0 in | Wt 163.0 lb

## 2015-01-25 DIAGNOSIS — I10 Essential (primary) hypertension: Secondary | ICD-10-CM

## 2015-01-25 MED ORDER — ALPRAZOLAM 0.25 MG PO TABS: 0.2500 mg | ORAL_TABLET | Freq: Two times a day (BID) | ORAL | Status: DC | PRN

## 2015-01-25 MED ORDER — HYDROCODONE-ACETAMINOPHEN 5-325 MG PO TABS: 1.0000 | ORAL_TABLET | Freq: Four times a day (QID) | ORAL | Status: DC | PRN

## 2015-01-25 MED ORDER — MELOXICAM 7.5 MG PO TABS: 7.5000 mg | ORAL_TABLET | Freq: Every day | ORAL | Status: DC

## 2015-01-25 NOTE — Progress Notes (Signed)
Pre visit review using our clinic review tool, if applicable. No additional management support is needed unless otherwise documented below in the visit note. 
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
## 2015-01-25 NOTE — Patient Instructions (Signed)
Call or return to clinic prn if these symptoms worsen or fail to improve as anticipated.

## 2015-02-16 ENCOUNTER — Encounter: Payer: Self-pay | Admitting: Internal Medicine

## 2015-02-16 ENCOUNTER — Ambulatory Visit (INDEPENDENT_AMBULATORY_CARE_PROVIDER_SITE_OTHER): Payer: 59 | Admitting: Internal Medicine

## 2015-02-16 VITALS — BP 130/90 | HR 76 | Temp 98.3°F | Resp 18 | Ht 67.0 in | Wt 166.0 lb

## 2015-02-16 DIAGNOSIS — I10 Essential (primary) hypertension: Secondary | ICD-10-CM

## 2015-02-16 DIAGNOSIS — R0789 Other chest pain: Secondary | ICD-10-CM

## 2015-02-16 DIAGNOSIS — R071 Chest pain on breathing: Secondary | ICD-10-CM

## 2015-02-16 MED ORDER — MELOXICAM 7.5 MG PO TABS: 7.5000 mg | ORAL_TABLET | Freq: Every day | ORAL | Status: DC

## 2015-02-16 NOTE — Patient Instructions (Signed)
Slowly resume your usual level of activities  Limit your sodium (Salt) intake  Please check your blood pressure on a regular basis.  If it is consistently greater than 150/90, please make an office appointment.  Return in 6 months for follow-up
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
## 2015-02-16 NOTE — Progress Notes (Signed)
Pre visit review using our clinic review tool, if applicable. No additional management support is needed unless otherwise documented below in the visit note. 

## 2015-04-14 ENCOUNTER — Telehealth: Payer: Self-pay

## 2015-04-14 NOTE — Telephone Encounter (Signed)
Pt will sch mammogram at her gyn office and will callback with date and time

## 2015-04-14 NOTE — Telephone Encounter (Signed)
Home phone busy, left mess. on cell to call back concerning overdue mammogram

## 2015-05-22 ENCOUNTER — Other Ambulatory Visit: Payer: Self-pay | Admitting: Internal Medicine

## 2015-05-26 ENCOUNTER — Ambulatory Visit (INDEPENDENT_AMBULATORY_CARE_PROVIDER_SITE_OTHER): Payer: 59 | Admitting: Internal Medicine

## 2015-05-26 ENCOUNTER — Encounter: Payer: Self-pay | Admitting: Internal Medicine

## 2015-05-26 VITALS — BP 130/90 | HR 60 | Temp 98.5°F | Resp 20 | Ht 67.0 in | Wt 172.0 lb

## 2015-05-26 DIAGNOSIS — R0789 Other chest pain: Secondary | ICD-10-CM

## 2015-05-26 DIAGNOSIS — E039 Hypothyroidism, unspecified: Secondary | ICD-10-CM | POA: Diagnosis not present

## 2015-05-26 DIAGNOSIS — Z91040 Latex allergy status: Secondary | ICD-10-CM

## 2015-05-26 DIAGNOSIS — R071 Chest pain on breathing: Secondary | ICD-10-CM

## 2015-05-26 DIAGNOSIS — I10 Essential (primary) hypertension: Secondary | ICD-10-CM

## 2015-05-26 MED ORDER — MELOXICAM 7.5 MG PO TABS: 7.5000 mg | ORAL_TABLET | Freq: Every day | ORAL | Status: DC
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
## 2015-05-26 NOTE — Patient Instructions (Signed)
Call or return to clinic prn if these symptoms worsen or fail to improve as anticipated.  Limit your sodium (Salt) intake  Please check your blood pressure on a regular basis.  If it is consistently greater than 150/90, please make an office appointment.  

## 2015-05-26 NOTE — Progress Notes (Signed)
Pre visit review using our clinic review tool, if applicable. No additional management support is needed unless otherwise documented below in the visit note. 

## 2015-06-17 ENCOUNTER — Other Ambulatory Visit: Payer: Self-pay | Admitting: Internal Medicine

## 2016-04-14 ENCOUNTER — Encounter (HOSPITAL_COMMUNITY): Payer: Self-pay | Admitting: Emergency Medicine

## 2016-04-14 ENCOUNTER — Ambulatory Visit (HOSPITAL_COMMUNITY): Payer: 59

## 2016-04-14 ENCOUNTER — Ambulatory Visit (HOSPITAL_COMMUNITY)
Admission: EM | Admit: 2016-04-14 | Discharge: 2016-04-14 | Disposition: A | Discharge status: Home / Self Care | Payer: 59 | Attending: Family Medicine | Admitting: Family Medicine

## 2016-04-14 DIAGNOSIS — Z882 Allergy status to sulfonamides status: Secondary | ICD-10-CM | POA: Diagnosis not present

## 2016-04-14 DIAGNOSIS — Z79899 Other long term (current) drug therapy: Secondary | ICD-10-CM | POA: Diagnosis not present

## 2016-04-14 DIAGNOSIS — W230XXA Caught, crushed, jammed, or pinched between moving objects, initial encounter: Secondary | ICD-10-CM | POA: Insufficient documentation

## 2016-04-14 DIAGNOSIS — Z888 Allergy status to other drugs, medicaments and biological substances status: Secondary | ICD-10-CM | POA: Insufficient documentation

## 2016-04-14 DIAGNOSIS — E039 Hypothyroidism, unspecified: Secondary | ICD-10-CM | POA: Insufficient documentation

## 2016-04-14 DIAGNOSIS — S60011A Contusion of right thumb without damage to nail, initial encounter: Secondary | ICD-10-CM | POA: Diagnosis not present

## 2016-04-14 DIAGNOSIS — Z881 Allergy status to other antibiotic agents status: Secondary | ICD-10-CM | POA: Insufficient documentation

## 2016-04-14 DIAGNOSIS — M79644 Pain in right finger(s): Secondary | ICD-10-CM | POA: Diagnosis present

## 2016-04-14 DIAGNOSIS — Z880 Allergy status to penicillin: Secondary | ICD-10-CM | POA: Insufficient documentation

## 2016-04-14 NOTE — ED Notes (Signed)
Pt reports she slammed car door to right thumb onset 1530... Sx today include swelling and pain... A&O x4... No acute distress.

## 2016-04-14 NOTE — ED Notes (Signed)
Applied splint... Secured w/1 in [place] Pt verb coban was ok to use.

## 2016-04-14 NOTE — ED Notes (Signed)
Patient transported to X-ray 

## 2016-04-14 NOTE — ED Provider Notes (Signed)
CSN: [id number]     Arrival date & time 04/14/16  1812 History   First MD Initiated Contact with Patient 04/14/16 1839     Chief Complaint  Patient presents with  . Finger Injury   (Consider location/radiation/quality/duration/timing/severity/associated sxs/prior Treatment) HPI Comments: 60 year old female states she accidentally slammed her right thumb in a car door this afternoon at about 3:30. Complaining of pain to the right thumb.   Past Medical History  Diagnosis Date  . HYPOTHYROIDISM 05/27/2007  . WEIGHT GAIN 07/15/2009  . PONV (postoperative nausea and vomiting)   . Headache(784.0)     cluster headaches, migraines- states is taking Atenolol at present as directed per Dr Lacinda Axon  to prevent migraine from increased stress-  states if on medication for several weeks that heart rate will drop to 40's  . Pneumonia     post op x 2  . Anemia   . Chronic kidney disease     stones   Past Surgical History  Procedure Laterality Date  . Abdominal hysterectomy    . Appendectomy    . Tonsillectomy    . Tympanostomy tube placement    . Foot surgery  2007    tumor removed from right foot/    removal Mortons neuroma    years ago  . Knee arthroscopy      x 2  . Tubal ligation    . Diagnostic laparoscopy    . Laparotomy  10/30/2011    Procedure: EXPLORATORY LAPAROTOMY;  Surgeon: Janie Morning, MD;  Location: [facility];  Service: Gynecology;  Laterality: N/A;  . Salpingoophorectomy  10/30/2011    Procedure: SALPINGO OOPHERECTOMY;  Surgeon: Janie Morning, MD;  Location: [facility];  Service: Gynecology;  Laterality: Right;  RIGHT   Family History  Problem Relation Age of Onset  . Hypertension Neg Hx     family hx  . Heart disease Neg Hx     family hx  . Alzheimer's disease Mother   . Cervical cancer Mother   . Diabetes Sister   . Heart attack Brother    Social History  Substance Use Topics  . Smoking status: Never Smoker   . Smokeless tobacco: Never Used  . Alcohol Use: No    OB History    No data available     Review of Systems  Constitutional: Negative.   HENT: Negative.   Musculoskeletal:       As per history of present illness  Skin: Negative.   Neurological: Negative.   All other systems reviewed and are negative.   Allergies  Adhesive; Dimethicone; Latex; Penicillins; Shellfish allergy; Sulfonamide derivatives; Alcohol-sulfur; Aspirin; Ciprofloxacin; Doxycycline; Erythromycin; and Red dye  Home Medications   Prior to Admission medications   Medication Sig Start Date End Date Taking? Authorizing Provider  Cyanocobalamin (VITAMIN B-12) 2500 MCG SUBL Place 1 tablet under the tongue daily.    Yes Historical Provider, MD  SYNTHROID 125 MCG tablet TAKE 1 TABLET (125 MCG TOTAL) BY MOUTH DAILY. 05/23/15  Yes Marletta Lor, MD  albuterol (PROVENTIL HFA;VENTOLIN HFA) 108 (90 BASE) MCG/ACT inhaler Inhale 2 puffs into the lungs every 6 (six) hours as needed for wheezing or shortness of breath. 05/28/14   Marletta Lor, MD  ALPRAZolam Duanne Moron) 0.25 MG tablet Take 1 tablet (0.25 mg total) by mouth 2 (two) times daily as needed for anxiety. 01/25/15   Marletta Lor, MD  atenolol (TENORMIN) 25 MG tablet Take 1 tablet (25 mg total) by mouth every morning. For migraines  05/28/14   Marletta Lor, MD  cholecalciferol (VITAMIN D) 1000 UNITS tablet Take 1,000 Units by mouth daily.     Historical Provider, MD  meloxicam (MOBIC) 7.5 MG tablet Take 1 tablet (7.5 mg total) by mouth daily. 05/26/15   Marletta Lor, MD  phentermine 30 MG capsule TAKE 1 CAPSULE BY MOUTH DAILY 05/28/14   Marletta Lor, MD  SYNTHROID 125 MCG tablet TAKE 1 TABLET (125 MCG TOTAL) BY MOUTH DAILY. 06/17/15   Marletta Lor, MD   Meds Ordered and Administered this Visit  Medications - No data to display  BP 165/99 mmHg  Pulse 82  Temp(Src) 97.9 F (36.6 C) (Oral)  SpO2 97% No data found.   Physical Exam  Constitutional: She is oriented to person, place,  and time. She appears well-developed and well-nourished. No distress.  Cardiovascular: Normal rate.   Pulmonary/Chest: Effort normal.  Musculoskeletal: She exhibits tenderness.  Tenderness to the right thumb MCP, IP and phalanges. There is minor ecchymosis at the edge of the nail. Patient unable to flex the thumb due to pain. No deformity. Minimal swelling. Normal warmth and color.  Neurological: She is alert and oriented to person, place, and time.  Skin: Skin is warm and dry.  Nursing note and vitals reviewed.   ED Course  Procedures (including critical care time)  Labs Review Labs Reviewed - No data to display  Imaging Review Dg Finger Thumb Right  04/14/2016  CLINICAL DATA:  Smashed right thumb in car door, with pain. Initial encounter. EXAM: RIGHT THUMB 2+V COMPARISON:  None. FINDINGS: There is no evidence of osseous disruption. Soft tissue swelling is noted about the right thumb. Visualized joint spaces are grossly preserved. IMPRESSION: No evidence of osseous disruption. Electronically Signed   By: Garald Balding M.D.   On: 04/14/2016 19:12     Visual Acuity Review  Right Eye Distance:   Left Eye Distance:   Bilateral Distance:    Right Eye Near:   Left Eye Near:    Bilateral Near:         MDM   1. Thumb contusion, right, initial encounter    Rest, ice, elevation, wear thumb splint for 4-5 days. Ibuprofen or tylenol for pain Contusion sheet    Janne Napoleon, NP 04/14/16 1921

## 2016-04-14 NOTE — Discharge Instructions (Signed)
Contusion Rest, ice, elevation, wear thumb splint for 4-5 days. Ibuprofen or tylenol for pain A contusion is a deep bruise. Contusions happen when an injury causes bleeding under the skin. Symptoms of bruising include pain, swelling, and discolored skin. The skin may turn blue, purple, or yellow. HOME CARE   Rest the injured area.  If told, put ice on the injured area.  Put ice in a plastic bag.  Place a towel between your skin and the bag.  Leave the ice on for 20 minutes, 2-3 times per day.  If told, put light pressure (compression) on the injured area using an elastic bandage. Make sure the bandage is not too tight. Remove it and put it back on as told by your doctor.  If possible, raise (elevate) the injured area above the level of your heart while you are sitting or lying down.  Take over-the-counter and prescription medicines only as told by your doctor. GET HELP IF:  Your symptoms do not get better after several days of treatment.  Your symptoms get worse.  You have trouble moving the injured area. GET HELP RIGHT AWAY IF:   You have very bad pain.  You have a loss of feeling (numbness) in a hand or foot.  Your hand or foot turns pale or cold.   This information is not intended to replace advice given to you by your health care provider. Make sure you discuss any questions you have with your health care provider.   Document Released: 04/16/2008 Document Revised: 07/20/2015 Document Reviewed: 03/16/2015 Elsevier Interactive Patient Education [employer].

## 2016-09-27 ENCOUNTER — Telehealth: Payer: Self-pay | Admitting: Internal Medicine

## 2016-09-27 MED ORDER — SYNTHROID 125 MCG PO TABS: ORAL_TABLET | ORAL | 0 refills | Status: DC

## 2016-09-27 MED ORDER — ALBUTEROL SULFATE HFA 108 (90 BASE) MCG/ACT IN AERS: 2.0000 | INHALATION_SPRAY | Freq: Four times a day (QID) | RESPIRATORY_TRACT | 0 refills | Status: DC | PRN

## 2016-09-27 MED ORDER — ATENOLOL 25 MG PO TABS: 25.0000 mg | ORAL_TABLET | ORAL | 0 refills | Status: DC

## 2016-09-27 NOTE — Telephone Encounter (Signed)
Pt need new Rx for atenolol, synthroid, albuterol and phentermine    Pharm:  [facility]

## 2016-09-27 NOTE — Telephone Encounter (Signed)
Left message on voicemail to call office. Rx's sent to pharmacy except Phentermine. Must see Dr.K first has not been seen since 05/2015.

## 2016-10-12 ENCOUNTER — Encounter: Payer: Self-pay | Admitting: Internal Medicine

## 2016-10-12 ENCOUNTER — Ambulatory Visit (INDEPENDENT_AMBULATORY_CARE_PROVIDER_SITE_OTHER): Payer: 59 | Admitting: Internal Medicine

## 2016-10-12 VITALS — BP 160/90 | HR 89 | Temp 98.1°F | Resp 20 | Ht 67.0 in | Wt 184.0 lb

## 2016-10-12 DIAGNOSIS — R0789 Other chest pain: Secondary | ICD-10-CM

## 2016-10-12 DIAGNOSIS — R911 Solitary pulmonary nodule: Secondary | ICD-10-CM | POA: Diagnosis not present

## 2016-10-12 DIAGNOSIS — E038 Other specified hypothyroidism: Secondary | ICD-10-CM | POA: Diagnosis not present

## 2016-10-12 DIAGNOSIS — I10 Essential (primary) hypertension: Secondary | ICD-10-CM

## 2016-10-12 DIAGNOSIS — R635 Abnormal weight gain: Secondary | ICD-10-CM

## 2016-10-12 LAB — TSH: TSH: 6.78 u[IU]/mL — AB (ref 0.35–4.50)

## 2016-10-12 MED ORDER — PHENTERMINE HCL 30 MG PO CAPS: ORAL_CAPSULE | ORAL | 1 refills | Status: DC

## 2016-10-12 NOTE — Progress Notes (Signed)
Subjective:    Patient ID: Katelyn [name], female    DOB: 03/22/1956, 60 y.o.   MRN: [id number]  HPI  60 year old patient who presents with a chief complaint of cough.  At times, this is associated with wheezing.  The patient has been treat with albuterol which has been quite helpful for both cough and wheezing She has remote history of benign pulmonary nodules and has had 2 CT scans in the past. She continues to complain of occasional anterior chest wall pain aggravated by cough and pressure She states that the cough is worsened by hot environments and also worse at night and in the early morning hours.  Cough is nonproductive.  Both a brother and her father have had asthma issues in the past She has a diagnosis of essential hypertension, but apparently takes atenolol only for headache prophylaxis. She has a history of obesity and states that has been off phentermine for 1 year and has had some associated weight gain She does monitor home blood pressure readings which are usually normal  She states that when she takes atenolol diastolic blood pressure drops to 40.  She has hypothyroidism but no recent lab  Past Medical History:  Diagnosis Date  . Anemia   . Chronic kidney disease    stones  . Headache(784.0)    cluster headaches, migraines- states is taking Atenolol at present as directed per Dr Lacinda Axon  to prevent migraine from increased stress-  states if on medication for several weeks that heart rate will drop to 40's  . HYPOTHYROIDISM 05/27/2007  . Pneumonia    post op x 2  . PONV (postoperative nausea and vomiting)   . WEIGHT GAIN 07/15/2009     Social History   Social History  . Marital status: Married    Spouse name: N/A  . Number of children: N/A  . Years of education: N/A   Occupational History  . Not on file.   Social History Main Topics  . Smoking status: Never Smoker  . Smokeless tobacco: Never Used  . Alcohol use No  . Drug use: No  . Sexual  activity: Yes   Other Topics Concern  . Not on file   Social History Narrative  . No narrative on file    Past Surgical History:  Procedure Laterality Date  . ABDOMINAL HYSTERECTOMY    . APPENDECTOMY    . DIAGNOSTIC LAPAROSCOPY    . FOOT SURGERY  2007   tumor removed from right foot/    removal Mortons neuroma    years ago  . KNEE ARTHROSCOPY     x 2  . LAPAROTOMY  10/30/2011   Procedure: EXPLORATORY LAPAROTOMY;  Surgeon: Janie Morning, MD;  Location: [facility];  Service: Gynecology;  Laterality: N/A;  . SALPINGOOPHORECTOMY  10/30/2011   Procedure: SALPINGO OOPHERECTOMY;  Surgeon: Janie Morning, MD;  Location: [facility];  Service: Gynecology;  Laterality: Right;  RIGHT  . TONSILLECTOMY    . TUBAL LIGATION    . TYMPANOSTOMY TUBE PLACEMENT      Family History  Problem Relation Age of Onset  . Hypertension Neg Hx     family hx  . Heart disease Neg Hx     family hx  . Alzheimer's disease Mother   . Cervical cancer Mother   . Diabetes Sister   . Heart attack Brother     Allergies  Allergen Reactions  . Adhesive [Tape] Swelling    Eats down into skin  . Dimethicone Hives  and Shortness Of Breath    Nitrile gloves,  . Latex Hives and Shortness Of Breath  . Penicillins Hives and Shortness Of Breath  . Shellfish Allergy Hives and Shortness Of Breath  . Sulfonamide Derivatives Hives, Shortness Of Breath and Swelling  . Alcohol-Sulfur [Sulfur] Swelling  . Aspirin Nausea And Vomiting  . Ciprofloxacin Hives, Swelling and Other (See Comments)    Like hand was on fire  . Doxycycline Nausea And Vomiting  . Erythromycin Swelling  . Red Dye Swelling    Current Outpatient Prescriptions on File Prior to Visit  Medication Sig Dispense Refill  . albuterol (PROVENTIL HFA;VENTOLIN HFA) 108 (90 Base) MCG/ACT inhaler Inhale 2 puffs into the lungs every 6 (six) hours as needed for wheezing or shortness of breath. 1 Inhaler 0  . ALPRAZolam (XANAX) 0.25 MG tablet Take 1 tablet (0.25 mg  total) by mouth 2 (two) times daily as needed for anxiety. 60 tablet 0  . atenolol (TENORMIN) 25 MG tablet Take 1 tablet (25 mg total) by mouth every morning. For migraines 90 tablet 0  . cholecalciferol (VITAMIN D) 1000 UNITS tablet Take 1,000 Units by mouth daily.     . Cyanocobalamin (VITAMIN B-12) 2500 MCG SUBL Place 1 tablet under the tongue daily.     . meloxicam (MOBIC) 7.5 MG tablet Take 1 tablet (7.5 mg total) by mouth daily. 30 tablet 0  . phentermine 30 MG capsule TAKE 1 CAPSULE BY MOUTH DAILY 90 capsule 1  . SYNTHROID 125 MCG tablet TAKE 1 TABLET (125 MCG TOTAL) BY MOUTH DAILY. 90 tablet 0   No current facility-administered medications on file prior to visit.     BP (!) 160/90 (BP Location: Right Arm, Patient Position: Sitting, Cuff Size: Normal)   Pulse 89   Temp 98.1 F (36.7 C) (Oral)   Resp 20   Ht 5\' 7"  (1.702 m)   Wt 184 lb (83.5 kg)   SpO2 98%   BMI 28.82 kg/m     Review of Systems  Constitutional: Positive for unexpected weight change.  HENT: Negative for congestion, dental problem, hearing loss, rhinorrhea, sinus pressure, sore throat and tinnitus.   Eyes: Negative for pain, discharge and visual disturbance.  Respiratory: Positive for cough. Negative for shortness of breath.   Cardiovascular: Positive for chest pain. Negative for palpitations and leg swelling.  Gastrointestinal: Negative for abdominal distention, abdominal pain, blood in stool, constipation, diarrhea, nausea and vomiting.  Genitourinary: Negative for difficulty urinating, dysuria, flank pain, frequency, hematuria, pelvic pain, urgency, vaginal bleeding, vaginal discharge and vaginal pain.  Musculoskeletal: Negative for arthralgias, gait problem and joint swelling.  Skin: Negative for rash.  Neurological: Negative for dizziness, syncope, speech difficulty, weakness, numbness and headaches.  Hematological: Negative for adenopathy.  Psychiatric/Behavioral: Negative for agitation, behavioral  problems and dysphoric mood. The patient is not nervous/anxious.        Objective:   Physical Exam  Constitutional: She is oriented to person, place, and time. She appears well-developed and well-nourished.  Weight 184 Blood pressure 160/90  HENT:  Head: Normocephalic.  Right Ear: External ear normal.  Left Ear: External ear normal.  Mouth/Throat: Oropharynx is clear and moist.  Eyes: Conjunctivae and EOM are normal. Pupils are equal, round, and reactive to light.  Neck: Normal range of motion. Neck supple. No thyromegaly present.  Cardiovascular: Normal rate, regular rhythm, normal heart sounds and intact distal pulses.   Pulmonary/Chest: Effort normal and breath sounds normal. She exhibits tenderness.  Tenderness to palpation over anterior  chest area with very gentle palpation  Abdominal: Soft. Bowel sounds are normal. She exhibits no mass. There is no tenderness.  Musculoskeletal: Normal range of motion.  Lymphadenopathy:    She has no cervical adenopathy.  Neurological: She is alert and oriented to person, place, and time.  Skin: Skin is warm and dry. No rash noted.  Psychiatric: She has a normal mood and affect. Her behavior is normal.          Assessment & Plan:   Chronic cough.  This has been associated with intermittent wheezing and may be a asthma equivalent.  Will give a trial of Advair for 30 days Rule out hypertension.  Will place on a low-salt DASH diet.  Continue home blood pressure monitoring.  Continue efforts at weight loss Nonspecific anterior chest wall pain, nonexertional Hypothyroidism with history of weight loss.  We'll check TSH History pulmonary nodules.  We'll discuss final chest CT screening.  No nodules noted on chest x-ray  Schedule CPX  Masiyah Engen Pilar Plate

## 2016-10-12 NOTE — Patient Instructions (Addendum)
Limit your sodium (Salt) intake  Please check your blood pressure on a regular basis.   Return in 6 weeks for follow-up  You need to lose weight.  Consider a lower calorie diet and regular exercise.    DASH Eating Plan DASH stands for "Dietary Approaches to Stop Hypertension." The DASH eating plan is a healthy eating plan that has been shown to reduce high blood pressure (hypertension). Additional health benefits may include reducing the risk of type 2 diabetes mellitus, heart disease, and stroke. The DASH eating plan may also help with weight loss. What do I need to know about the DASH eating plan? For the DASH eating plan, you will follow these general guidelines:  Choose foods with less than 150 milligrams of sodium per serving (as listed on the food label).  Use salt-free seasonings or herbs instead of table salt or sea salt.  Check with your health care provider or pharmacist before using salt substitutes.  Eat lower-sodium products. These are often labeled as "low-sodium" or "no salt added."  Eat fresh foods. Avoid eating a lot of canned foods.  Eat more vegetables, fruits, and low-fat dairy products.  Choose whole grains. Look for the word "whole" as the first word in the ingredient list.  Choose fish and skinless chicken or [place] more often than red meat. Limit fish, poultry, and meat to 6 oz (170 g) each day.  Limit sweets, desserts, sugars, and sugary drinks.  Choose heart-healthy fats.  Eat more home-cooked food and less restaurant, buffet, and fast food.  Limit fried foods.  Do not fry foods. Cook foods using methods such as baking, boiling, grilling, and broiling instead.  When eating at a restaurant, ask that your food be prepared with less salt, or no salt if possible. What foods can I eat? Seek help from a dietitian for individual calorie needs. Grains  Whole grain or whole wheat bread. Brown rice. Whole grain or whole wheat pasta. Quinoa, bulgur, and  whole grain cereals. Low-sodium cereals. Corn or whole wheat flour tortillas. Whole grain cornbread. Whole grain crackers. Low-sodium crackers. Vegetables  Fresh or frozen vegetables (raw, steamed, roasted, or grilled). Low-sodium or reduced-sodium tomato and vegetable juices. Low-sodium or reduced-sodium tomato sauce and paste. Low-sodium or reduced-sodium canned vegetables. Fruits  All fresh, canned (in natural juice), or frozen fruits. Meat and Other Protein Products  Ground beef (85% or leaner), grass-fed beef, or beef trimmed of fat. Skinless chicken or [place]. Ground chicken or [place]. Pork trimmed of fat. All fish and seafood. Eggs. Dried beans, peas, or lentils. Unsalted nuts and seeds. Unsalted canned beans. Dairy  Low-fat dairy products, such as skim or 1% milk, 2% or reduced-fat cheeses, low-fat ricotta or cottage cheese, or plain low-fat yogurt. Low-sodium or reduced-sodium cheeses. Fats and Oils  Tub margarines without trans fats. Light or reduced-fat mayonnaise and salad dressings (reduced sodium). Avocado. Safflower, olive, or canola oils. Natural peanut or almond butter. Other  Unsalted popcorn and pretzels. The items listed above may not be a complete list of recommended foods or beverages. Contact your dietitian for more options.  What foods are not recommended? Grains  White bread. White pasta. White rice. Refined cornbread. Bagels and croissants. Crackers that contain trans fat. Vegetables  Creamed or fried vegetables. Vegetables in a cheese sauce. Regular canned vegetables. Regular canned tomato sauce and paste. Regular tomato and vegetable juices. Fruits  Canned fruit in light or heavy syrup. Fruit juice. Meat and Other Protein Products  Fatty cuts of meat.  Ribs, chicken wings, bacon, sausage, bologna, salami, chitterlings, fatback, hot dogs, bratwurst, and packaged luncheon meats. Salted nuts and seeds. Canned beans with salt. Dairy  Whole or 2% milk, cream,  half-and-half, and cream cheese. Whole-fat or sweetened yogurt. Full-fat cheeses or blue cheese. Nondairy creamers and whipped toppings. Processed cheese, cheese spreads, or cheese curds. Condiments  Onion and garlic salt, seasoned salt, table salt, and sea salt. Canned and packaged gravies. Worcestershire sauce. Tartar sauce. Barbecue sauce. Teriyaki sauce. Soy sauce, including reduced sodium. Steak sauce. Fish sauce. Oyster sauce. Cocktail sauce. Horseradish. Ketchup and mustard. Meat flavorings and tenderizers. Bouillon cubes. Hot sauce. Tabasco sauce. Marinades. Taco seasonings. Relishes. Fats and Oils  Butter, stick margarine, lard, shortening, ghee, and bacon fat. Coconut, palm kernel, or palm oils. Regular salad dressings. Other  Pickles and olives. Salted popcorn and pretzels. The items listed above may not be a complete list of foods and beverages to avoid. Contact your dietitian for more information.  Where can I find more information? National Heart, Lung, and Blood Institute: travelstabloid.com This information is not intended to replace advice given to you by your health care provider. Make sure you discuss any questions you have with your health care provider. Document Released: 10/18/2011 Document Revised: 04/05/2016 Document Reviewed: 09/02/2013 Elsevier Interactive Patient Education  2017 [place] one inhalation  twice daily

## 2016-10-12 NOTE — Progress Notes (Signed)
Pre visit review using our clinic review tool, if applicable. No additional management support is needed unless otherwise documented below in the visit note. 

## 2016-10-23 ENCOUNTER — Telehealth: Payer: Self-pay | Admitting: *Deleted

## 2016-10-23 NOTE — Telephone Encounter (Signed)
Dr.K, pt left message on my voicemail that husband just had surgery and pt is c/o sore throat, chest congestion and wants to know if you would send in a Rx for Keflex. Please advise.

## 2016-10-23 NOTE — Telephone Encounter (Signed)
Spoke to pt, told her Dr.K said acute bronchitis symptoms for less than 10 days are generally not helped by antibiotics.  Take over-the-counter expectorants and cough medications such as  Mucinex DM.  Call if there is no improvement in 5 to 7 days or if  you develop worsening cough, fever, or new symptoms, such as shortness of breath or chest pain. Pt verbalized understanding and said she has been exposed to Flu and has chills. Told pt she will need to be seen if she wants an antibiotic. Asked her if she can come in tomorrow to see Tommi Rumps NP? Pt said what time? Told her I have 9 & 9:15. Pt said she will take 9:15. Told her okay see you then. Appt scheduled. Pt verbalized understanding.

## 2016-10-23 NOTE — Telephone Encounter (Signed)
Acute bronchitis symptoms for less than 10 days are generally not helped by antibiotics.  Take over-the-counter expectorants and cough medications such as  Mucinex DM.  Call if there is no improvement in 5 to 7 days or if  you develop worsening cough, fever, or new symptoms, such as shortness of breath or chest pain.   

## 2016-10-24 ENCOUNTER — Ambulatory Visit (INDEPENDENT_AMBULATORY_CARE_PROVIDER_SITE_OTHER)
Admission: RE | Admit: 2016-10-24 | Discharge: 2016-10-24 | Disposition: A | Discharge status: Home / Self Care | Payer: 59 | Source: Ambulatory Visit | Attending: Adult Health | Admitting: Adult Health

## 2016-10-24 ENCOUNTER — Ambulatory Visit (INDEPENDENT_AMBULATORY_CARE_PROVIDER_SITE_OTHER): Payer: 59 | Admitting: Adult Health

## 2016-10-24 ENCOUNTER — Encounter: Payer: Self-pay | Admitting: Adult Health

## 2016-10-24 VITALS — BP 160/90 | HR 90 | Temp 98.2°F | Wt 178.6 lb

## 2016-10-24 DIAGNOSIS — R059 Cough, unspecified: Secondary | ICD-10-CM

## 2016-10-24 DIAGNOSIS — R05 Cough: Secondary | ICD-10-CM

## 2016-10-24 MED ORDER — METHYLPREDNISOLONE 4 MG PO TBPK: ORAL_TABLET | ORAL | 0 refills | Status: DC

## 2016-10-24 NOTE — Patient Instructions (Addendum)
It was great meeting you today. I am sorry you are feeling so badly.   I have sent in a medrol dose pack for you.   I will follow up with you after the x ray is back.    Acute Bronchitis, Adult Acute bronchitis is when air tubes (bronchi) in the lungs suddenly get swollen. The condition can make it hard to breathe. It can also cause these symptoms:  A cough.  Coughing up clear, yellow, or green mucus.  Wheezing.  Chest congestion.  Shortness of breath.  A fever.  Body aches.  Chills.  A sore throat. Follow these instructions at home: Medicines  Take over-the-counter and prescription medicines only as told by your doctor.  If you were prescribed an antibiotic medicine, take it as told by your doctor. Do not stop taking the antibiotic even if you start to feel better. General instructions  Rest.  Drink enough fluids to keep your pee (urine) clear or pale yellow.  Avoid smoking and secondhand smoke. If you smoke and you need help quitting, ask your doctor. Quitting will help your lungs heal faster.  Use an inhaler, cool mist vaporizer, or humidifier as told by your doctor.  Keep all follow-up visits as told by your doctor. This is important. How is this prevented? To lower your risk of getting this condition again:  Wash your hands often with soap and water. If you cannot use soap and water, use hand sanitizer.  Avoid contact with people who have cold symptoms.  Try not to touch your hands to your mouth, nose, or eyes.  Make sure to get the flu shot every year. Contact a doctor if:  Your symptoms do not get better in 2 weeks. Get help right away if:  You cough up blood.  You have chest pain.  You have very bad shortness of breath.  You become dehydrated.  You faint (pass out) or keep feeling like you are going to pass out.  You keep throwing up (vomiting).  You have a very bad headache.  Your fever or chills gets worse. This information is not  intended to replace advice given to you by your health care provider. Make sure you discuss any questions you have with your health care provider. Document Released: 04/16/2008 Document Revised: 06/06/2016 Document Reviewed: 04/18/2016 Elsevier Interactive Patient Education  2017 [employer].

## 2016-10-24 NOTE — Progress Notes (Signed)
Pre visit review using our clinic review tool, if applicable. No additional management support is needed unless otherwise documented below in the visit note. 

## 2016-10-24 NOTE — Progress Notes (Signed)
Subjective:    Patient ID: Katelyn [name], female    DOB: [birth year], 60 y.o.   MRN: [id number]  Cough  This is a new problem. The current episode started yesterday. The problem has been unchanged. The cough is productive of sputum. Associated symptoms include postnasal drip and wheezing. Pertinent negatives include no chest pain, chills, ear congestion, ear pain, fever, nasal congestion, rhinorrhea or shortness of breath. She has tried steroid inhaler and OTC cough suppressant (mucinex and prescribed inhaler ) for the symptoms. The treatment provided mild relief. Her past medical history is significant for asthma and bronchitis. There is no history of environmental allergies.    Review of Systems  Constitutional: Negative for chills and fever.  HENT: Positive for postnasal drip. Negative for congestion, ear pain, rhinorrhea, sinus pain and sinus pressure.   Respiratory: Positive for cough, chest tightness and wheezing. Negative for shortness of breath.   Cardiovascular: Negative for chest pain.  Allergic/Immunologic: Negative for environmental allergies.   Past Medical History:  Diagnosis Date  . Anemia   . Chronic kidney disease    stones  . Headache(784.0)    cluster headaches, migraines- states is taking Atenolol at present as directed per Dr Lacinda Axon  to prevent migraine from increased stress-  states if on medication for several weeks that heart rate will drop to 40's  . HYPOTHYROIDISM 05/27/2007  . Pneumonia    post op x 2  . PONV (postoperative nausea and vomiting)   . WEIGHT GAIN 07/15/2009    Social History   Social History  . Marital status: Married    Spouse name: N/A  . Number of children: N/A  . Years of education: N/A   Occupational History  . Not on file.   Social History Main Topics  . Smoking status: Never Smoker  . Smokeless tobacco: Never Used  . Alcohol use No  . Drug use: No  . Sexual activity: Yes   Other Topics Concern  . Not on file    Social History Narrative  . No narrative on file    Past Surgical History:  Procedure Laterality Date  . ABDOMINAL HYSTERECTOMY    . APPENDECTOMY    . DIAGNOSTIC LAPAROSCOPY    . FOOT SURGERY  2007   tumor removed from right foot/    removal Mortons neuroma    years ago  . KNEE ARTHROSCOPY     x 2  . LAPAROTOMY  10/30/2011   Procedure: EXPLORATORY LAPAROTOMY;  Surgeon: Janie Morning, MD;  Location: [facility];  Service: Gynecology;  Laterality: N/A;  . SALPINGOOPHORECTOMY  10/30/2011   Procedure: SALPINGO OOPHERECTOMY;  Surgeon: Janie Morning, MD;  Location: [facility];  Service: Gynecology;  Laterality: Right;  RIGHT  . TONSILLECTOMY    . TUBAL LIGATION    . TYMPANOSTOMY TUBE PLACEMENT      Family History  Problem Relation Age of Onset  . Hypertension Neg Hx     family hx  . Heart disease Neg Hx     family hx  . Alzheimer's disease Mother   . Cervical cancer Mother   . Diabetes Sister   . Heart attack Brother     Allergies  Allergen Reactions  . Adhesive [Tape] Swelling    Eats down into skin  . Dimethicone Hives and Shortness Of Breath    Nitrile gloves,  . Latex Hives and Shortness Of Breath  . Penicillins Hives and Shortness Of Breath  . Shellfish Allergy Hives and Shortness Of Breath  .  Sulfonamide Derivatives Hives, Shortness Of Breath and Swelling  . Alcohol-Sulfur [Sulfur] Swelling  . Aspirin Nausea And Vomiting  . Ciprofloxacin Hives, Swelling and Other (See Comments)    Like hand was on fire  . Doxycycline Nausea And Vomiting  . Erythromycin Swelling  . Red Dye Swelling    Current Outpatient Prescriptions on File Prior to Visit  Medication Sig Dispense Refill  . albuterol (PROVENTIL HFA;VENTOLIN HFA) 108 (90 Base) MCG/ACT inhaler Inhale 2 puffs into the lungs every 6 (six) hours as needed for wheezing or shortness of breath. 1 Inhaler 0  . atenolol (TENORMIN) 25 MG tablet Take 1 tablet (25 mg total) by mouth every morning. For migraines 90 tablet 0  .  cholecalciferol (VITAMIN D) 1000 UNITS tablet Take 1,000 Units by mouth daily.     . Cyanocobalamin (VITAMIN B-12) 2500 MCG SUBL Place 1 tablet under the tongue daily.     . phentermine 30 MG capsule TAKE 1 CAPSULE BY MOUTH DAILY 90 capsule 1  . SYNTHROID 125 MCG tablet TAKE 1 TABLET (125 MCG TOTAL) BY MOUTH DAILY. 90 tablet 0   No current facility-administered medications on file prior to visit.     BP (!) 160/90 (BP Location: Right Arm, Patient Position: Sitting, Cuff Size: Normal)   Pulse 90   Temp 98.2 F (36.8 C) (Oral)   Wt 178 lb 9.6 oz (81 kg)   SpO2 97%   BMI 27.97 kg/m       Objective:   Physical Exam  Constitutional: She is oriented to person, place, and time. She appears well-developed and well-nourished. No distress.  HENT:  Head: Normocephalic and atraumatic.  Right Ear: External ear normal.  Left Ear: External ear normal.  Nose: Nose normal.  Mouth/Throat: Oropharynx is clear and moist. No oropharyngeal exudate.  Neck: Normal range of motion. Neck supple.  Cardiovascular: Normal rate, regular rhythm, normal heart sounds and intact distal pulses.  Exam reveals no gallop.   No murmur heard. Pulmonary/Chest: Effort normal and breath sounds normal. No respiratory distress. She has no wheezes. She has no rales. She exhibits no tenderness.  Dry constant cough   Lymphadenopathy:    She has no cervical adenopathy.  Neurological: She is alert and oriented to person, place, and time.  Skin: Skin is warm and dry. No rash noted. She is not diaphoretic. No erythema. No pallor.  Psychiatric: She has a normal mood and affect. Her behavior is normal. Judgment and thought content normal.  Nursing note and vitals reviewed.     Assessment & Plan:  1. Cough - Likely bronchitis. She wanted a prescription for Keflex. Informed her that an antibiotic would likely do nothing for her cough, especially after only having this cough for 24 hours. Will get a chest x ray to r/o pneumonia    - methylPREDNISolone (MEDROL DOSEPAK) 4 MG TBPK tablet; Take as directed  Dispense: 21 tablet; Refill: 0 - DG Chest 2 View; Future - Follow up if no improvement  - Continue with Mucinex  Dorothyann Peng, NP

## 2016-11-23 ENCOUNTER — Ambulatory Visit (INDEPENDENT_AMBULATORY_CARE_PROVIDER_SITE_OTHER): Payer: 59 | Admitting: Internal Medicine

## 2016-11-23 DIAGNOSIS — Z0289 Encounter for other administrative examinations: Secondary | ICD-10-CM

## 2016-12-28 ENCOUNTER — Other Ambulatory Visit: Payer: Self-pay | Admitting: Obstetrics and Gynecology

## 2016-12-28 DIAGNOSIS — Z01419 Encounter for gynecological examination (general) (routine) without abnormal findings: Secondary | ICD-10-CM | POA: Diagnosis not present

## 2016-12-28 DIAGNOSIS — Z1231 Encounter for screening mammogram for malignant neoplasm of breast: Secondary | ICD-10-CM | POA: Diagnosis not present

## 2016-12-30 ENCOUNTER — Other Ambulatory Visit: Payer: Self-pay | Admitting: Internal Medicine

## 2016-12-31 LAB — CYTOLOGY - PAP

## 2017-01-08 ENCOUNTER — Observation Stay (HOSPITAL_COMMUNITY)
Admission: EM | Admit: 2017-01-08 | Discharge: 2017-01-11 | DRG: 392 | Disposition: A | Discharge status: Home / Self Care | Payer: 59 | Attending: Internal Medicine | Admitting: Internal Medicine

## 2017-01-08 ENCOUNTER — Emergency Department (HOSPITAL_COMMUNITY): Payer: 59

## 2017-01-08 ENCOUNTER — Encounter: Payer: Self-pay | Admitting: Family Medicine

## 2017-01-08 ENCOUNTER — Ambulatory Visit (INDEPENDENT_AMBULATORY_CARE_PROVIDER_SITE_OTHER): Payer: 59 | Admitting: Family Medicine

## 2017-01-08 ENCOUNTER — Encounter (HOSPITAL_COMMUNITY): Payer: Self-pay | Admitting: Emergency Medicine

## 2017-01-08 VITALS — BP 110/80 | HR 107 | Temp 97.7°F | Ht 67.0 in | Wt 169.3 lb

## 2017-01-08 DIAGNOSIS — Z881 Allergy status to other antibiotic agents status: Secondary | ICD-10-CM | POA: Diagnosis not present

## 2017-01-08 DIAGNOSIS — E860 Dehydration: Secondary | ICD-10-CM

## 2017-01-08 DIAGNOSIS — R0789 Other chest pain: Secondary | ICD-10-CM | POA: Diagnosis not present

## 2017-01-08 DIAGNOSIS — Z882 Allergy status to sulfonamides status: Secondary | ICD-10-CM | POA: Diagnosis not present

## 2017-01-08 DIAGNOSIS — Z886 Allergy status to analgesic agent status: Secondary | ICD-10-CM | POA: Diagnosis not present

## 2017-01-08 DIAGNOSIS — R0600 Dyspnea, unspecified: Secondary | ICD-10-CM | POA: Diagnosis not present

## 2017-01-08 DIAGNOSIS — R42 Dizziness and giddiness: Secondary | ICD-10-CM

## 2017-01-08 DIAGNOSIS — Z9102 Food additives allergy status: Secondary | ICD-10-CM

## 2017-01-08 DIAGNOSIS — G43909 Migraine, unspecified, not intractable, without status migrainosus: Secondary | ICD-10-CM | POA: Diagnosis not present

## 2017-01-08 DIAGNOSIS — K529 Noninfective gastroenteritis and colitis, unspecified: Secondary | ICD-10-CM

## 2017-01-08 DIAGNOSIS — K760 Fatty (change of) liver, not elsewhere classified: Secondary | ICD-10-CM | POA: Diagnosis present

## 2017-01-08 DIAGNOSIS — E039 Hypothyroidism, unspecified: Secondary | ICD-10-CM

## 2017-01-08 DIAGNOSIS — R1110 Vomiting, unspecified: Secondary | ICD-10-CM | POA: Diagnosis not present

## 2017-01-08 DIAGNOSIS — R0902 Hypoxemia: Secondary | ICD-10-CM

## 2017-01-08 DIAGNOSIS — A0832 Astrovirus enteritis: Secondary | ICD-10-CM | POA: Diagnosis not present

## 2017-01-08 DIAGNOSIS — R197 Diarrhea, unspecified: Secondary | ICD-10-CM | POA: Diagnosis not present

## 2017-01-08 DIAGNOSIS — R0602 Shortness of breath: Secondary | ICD-10-CM | POA: Diagnosis not present

## 2017-01-08 DIAGNOSIS — I10 Essential (primary) hypertension: Secondary | ICD-10-CM | POA: Diagnosis not present

## 2017-01-08 DIAGNOSIS — R109 Unspecified abdominal pain: Secondary | ICD-10-CM | POA: Diagnosis not present

## 2017-01-08 DIAGNOSIS — R1111 Vomiting without nausea: Secondary | ICD-10-CM | POA: Diagnosis present

## 2017-01-08 DIAGNOSIS — Z91048 Other nonmedicinal substance allergy status: Secondary | ICD-10-CM | POA: Diagnosis not present

## 2017-01-08 DIAGNOSIS — Z91013 Allergy to seafood: Secondary | ICD-10-CM

## 2017-01-08 DIAGNOSIS — Z91040 Latex allergy status: Secondary | ICD-10-CM

## 2017-01-08 DIAGNOSIS — A084 Viral intestinal infection, unspecified: Secondary | ICD-10-CM

## 2017-01-08 DIAGNOSIS — E876 Hypokalemia: Secondary | ICD-10-CM

## 2017-01-08 DIAGNOSIS — N390 Urinary tract infection, site not specified: Secondary | ICD-10-CM | POA: Diagnosis not present

## 2017-01-08 DIAGNOSIS — R112 Nausea with vomiting, unspecified: Secondary | ICD-10-CM

## 2017-01-08 LAB — COMPREHENSIVE METABOLIC PANEL
ALBUMIN: 4.4 g/dL (ref 3.5–5.0)
ALT: 29 U/L (ref 14–54)
ANION GAP: 8 (ref 5–15)
AST: 26 U/L (ref 15–41)
Alkaline Phosphatase: 88 U/L (ref 38–126)
BILIRUBIN TOTAL: 0.8 mg/dL (ref 0.3–1.2)
BUN: 14 mg/dL (ref 6–20)
CHLORIDE: 104 mmol/L (ref 101–111)
CO2: 24 mmol/L (ref 22–32)
Calcium: 8.9 mg/dL (ref 8.9–10.3)
Creatinine, Ser: 1.06 mg/dL — ABNORMAL HIGH (ref 0.44–1.00)
GFR calc Af Amer: >60 mL/min (ref >60)
GFR calc non Af Amer: 56 mL/min — ABNORMAL LOW (ref >60)
GLUCOSE: 121 mg/dL — AB (ref 65–99)
POTASSIUM: 2.7 mmol/L — AB (ref 3.5–5.1)
Sodium: 136 mmol/L (ref 135–145)
TOTAL PROTEIN: 8.1 g/dL (ref 6.5–8.1)

## 2017-01-08 LAB — URINALYSIS, ROUTINE W REFLEX MICROSCOPIC
BACTERIA UA: NONE SEEN
BILIRUBIN URINE: NEGATIVE
GLUCOSE, UA: NEGATIVE mg/dL
KETONES UR: NEGATIVE mg/dL
NITRITE: NEGATIVE
PH: 6 (ref 5.0–8.0)
Protein, ur: NEGATIVE mg/dL
Specific Gravity, Urine: 1.029 (ref 1.005–1.030)

## 2017-01-08 LAB — CBC
HEMATOCRIT: 48 % — AB (ref 36.0–46.0)
HEMOGLOBIN: 17.1 g/dL — AB (ref 12.0–15.0)
MCH: 29.1 pg (ref 26.0–34.0)
MCHC: 35.6 g/dL (ref 30.0–36.0)
MCV: 81.6 fL (ref 78.0–100.0)
Platelets: 345 10*3/uL (ref 150–400)
RBC: 5.88 MIL/uL — ABNORMAL HIGH (ref 3.87–5.11)
RDW: 12.8 % (ref 11.5–15.5)
WBC: 10.2 10*3/uL (ref 4.0–10.5)

## 2017-01-08 LAB — CREATININE, SERUM
CREATININE: 0.73 mg/dL (ref 0.44–1.00)
GFR calc Af Amer: >60 mL/min (ref >60)
GFR calc non Af Amer: >60 mL/min (ref >60)

## 2017-01-08 LAB — LIPASE, BLOOD: LIPASE: 28 U/L (ref 11–51)

## 2017-01-08 LAB — I-STAT TROPONIN, ED: Troponin i, poc: 0 ng/mL (ref 0.00–0.08)

## 2017-01-08 LAB — CBG MONITORING, ED: Glucose-Capillary: 118 mg/dL — ABNORMAL HIGH (ref 65–99)

## 2017-01-08 MED ORDER — IOPAMIDOL (ISOVUE-300) INJECTION 61%
100.0000 mL | Freq: Once | INTRAVENOUS | Status: AC | PRN
  Administered 2017-01-08: 100 mL via INTRAVENOUS

## 2017-01-08 MED ORDER — ALBUTEROL SULFATE HFA 108 (90 BASE) MCG/ACT IN AERS: 2.0000 | INHALATION_SPRAY | Freq: Four times a day (QID) | RESPIRATORY_TRACT | Status: DC | PRN

## 2017-01-08 MED ORDER — SODIUM CHLORIDE 0.9 % IV BOLUS (SEPSIS)
1000.0000 mL | Freq: Once | INTRAVENOUS | Status: AC
  Administered 2017-01-08: 1000 mL via INTRAVENOUS

## 2017-01-08 MED ORDER — SODIUM CHLORIDE 0.9 % IV SOLN
INTRAVENOUS | Status: DC
  Administered 2017-01-08 – 2017-01-09 (×2): via INTRAVENOUS

## 2017-01-08 MED ORDER — ONDANSETRON HCL 4 MG/2ML IJ SOLN
4.0000 mg | Freq: Once | INTRAMUSCULAR | Status: AC
  Administered 2017-01-08: 4 mg via INTRAVENOUS
  Filled 2017-01-08: qty 2

## 2017-01-08 MED ORDER — DEXTROSE 5 % IV SOLN: 2.0000 g | Freq: Once | INTRAVENOUS | Status: DC

## 2017-01-08 MED ORDER — TRAMADOL HCL 50 MG PO TABS
50.0000 mg | ORAL_TABLET | Freq: Four times a day (QID) | ORAL | Status: DC | PRN
  Administered 2017-01-09 – 2017-01-11 (×3): 50 mg via ORAL
  Filled 2017-01-08 (×3): qty 1

## 2017-01-08 MED ORDER — ACETAMINOPHEN 325 MG PO TABS
650.0000 mg | ORAL_TABLET | Freq: Four times a day (QID) | ORAL | Status: DC | PRN
  Administered 2017-01-09 – 2017-01-10 (×2): 650 mg via ORAL
  Filled 2017-01-08 (×2): qty 2

## 2017-01-08 MED ORDER — LEVOTHYROXINE SODIUM 50 MCG PO TABS
125.0000 ug | ORAL_TABLET | Freq: Every day | ORAL | Status: DC
  Administered 2017-01-09 – 2017-01-11 (×3): 125 ug via ORAL
  Filled 2017-01-08 (×4): qty 5

## 2017-01-08 MED ORDER — ENOXAPARIN SODIUM 40 MG/0.4ML ~~LOC~~ SOLN
40.0000 mg | SUBCUTANEOUS | Status: DC
  Filled 2017-01-08 (×3): qty 0.4

## 2017-01-08 MED ORDER — ACETAMINOPHEN 650 MG RE SUPP: 650.0000 mg | Freq: Four times a day (QID) | RECTAL | Status: DC | PRN

## 2017-01-08 MED ORDER — ONDANSETRON HCL 4 MG PO TABS: 4.0000 mg | ORAL_TABLET | Freq: Four times a day (QID) | ORAL | Status: DC | PRN

## 2017-01-08 MED ORDER — ONDANSETRON 4 MG PO TBDP
4.0000 mg | ORAL_TABLET | Freq: Once | ORAL | Status: AC | PRN
  Administered 2017-01-08: 4 mg via ORAL
  Filled 2017-01-08: qty 1

## 2017-01-08 MED ORDER — ONDANSETRON HCL 4 MG/2ML IJ SOLN
4.0000 mg | Freq: Four times a day (QID) | INTRAMUSCULAR | Status: DC | PRN
  Administered 2017-01-08 – 2017-01-11 (×3): 4 mg via INTRAVENOUS
  Filled 2017-01-08 (×3): qty 2

## 2017-01-08 MED ORDER — METRONIDAZOLE 500 MG PO TABS: 500.0000 mg | ORAL_TABLET | Freq: Three times a day (TID) | ORAL | Status: DC

## 2017-01-08 MED ORDER — IOPAMIDOL (ISOVUE-300) INJECTION 61%
INTRAVENOUS | Status: AC
  Filled 2017-01-08: qty 100

## 2017-01-08 MED ORDER — SODIUM CHLORIDE 0.9 % IV SOLN
1.0000 g | INTRAVENOUS | Status: AC
  Administered 2017-01-08: 1 g via INTRAVENOUS
  Filled 2017-01-08: qty 1

## 2017-01-08 MED ORDER — SODIUM CHLORIDE 0.9 % IV SOLN
Freq: Once | INTRAVENOUS | Status: AC
  Administered 2017-01-08: 14:00:00 via INTRAVENOUS
  Filled 2017-01-08: qty 1000

## 2017-01-08 MED ORDER — ALBUTEROL SULFATE (2.5 MG/3ML) 0.083% IN NEBU: 2.5000 mg | INHALATION_SOLUTION | Freq: Four times a day (QID) | RESPIRATORY_TRACT | Status: DC | PRN

## 2017-01-08 MED ORDER — SODIUM CHLORIDE 0.9 % IV SOLN
1.0000 g | Freq: Three times a day (TID) | INTRAVENOUS | Status: DC
  Administered 2017-01-09 – 2017-01-10 (×5): 1 g via INTRAVENOUS
  Filled 2017-01-08 (×6): qty 1

## 2017-01-08 NOTE — Progress Notes (Signed)
HPI:  Acute visit for:  Dizziness/SOB/Diarrhea: -had the flu about 2 weeks ago and was starting to get better -then felt much worse the last 3 days - watery diarrhea, vomiting, anorexia, SOB, weakness and dizziness -feels can not drink fluids and feeling worse today  ROS: See pertinent positives and negatives per HPI.  Past Medical History:  Diagnosis Date  . Anemia   . Chronic kidney disease    stones  . Headache(784.0)    cluster headaches, migraines- states is taking Atenolol at present as directed per Dr Lacinda Axon  to prevent migraine from increased stress-  states if on medication for several weeks that heart rate will drop to 40's  . HYPOTHYROIDISM 05/27/2007  . Pneumonia    post op x 2  . PONV (postoperative nausea and vomiting)   . WEIGHT GAIN 07/15/2009    Past Surgical History:  Procedure Laterality Date  . ABDOMINAL HYSTERECTOMY    . APPENDECTOMY    . DIAGNOSTIC LAPAROSCOPY    . FOOT SURGERY  2007   tumor removed from right foot/    removal Mortons neuroma    years ago  . KNEE ARTHROSCOPY     x 2  . LAPAROTOMY  10/30/2011   Procedure: EXPLORATORY LAPAROTOMY;  Surgeon: Janie Morning, MD;  Location: [facility];  Service: Gynecology;  Laterality: N/A;  . SALPINGOOPHORECTOMY  10/30/2011   Procedure: SALPINGO OOPHERECTOMY;  Surgeon: Janie Morning, MD;  Location: [facility];  Service: Gynecology;  Laterality: Right;  RIGHT  . TONSILLECTOMY    . TUBAL LIGATION    . TYMPANOSTOMY TUBE PLACEMENT      Family History  Problem Relation Age of Onset  . Alzheimer's disease Mother   . Cervical cancer Mother   . Diabetes Sister   . Heart attack Brother   . Hypertension Neg Hx     family hx  . Heart disease Neg Hx     family hx    Social History   Social History  . Marital status: Married    Spouse name: N/A  . Number of children: N/A  . Years of education: N/A   Social History Main Topics  . Smoking status: Never Smoker  . Smokeless tobacco: Never Used  .  Alcohol use No  . Drug use: No  . Sexual activity: Yes   Other Topics Concern  . None   Social History Narrative  . None     Current Outpatient Prescriptions:  .  albuterol (PROVENTIL HFA;VENTOLIN HFA) 108 (90 Base) MCG/ACT inhaler, Inhale 2 puffs into the lungs every 6 (six) hours as needed for wheezing or shortness of breath., Disp: 1 Inhaler, Rfl: 0 .  atenolol (TENORMIN) 25 MG tablet, TAKE 1 TABLET (25 MG TOTAL) BY MOUTH EVERY MORNING. FOR MIGRAINES, Disp: 90 tablet, Rfl: 1 .  cholecalciferol (VITAMIN D) 1000 UNITS tablet, Take 1,000 Units by mouth daily. , Disp: , Rfl:  .  Cyanocobalamin (VITAMIN B-12) 2500 MCG SUBL, Place 1 tablet under the tongue daily. , Disp: , Rfl:  .  methylPREDNISolone (MEDROL DOSEPAK) 4 MG TBPK tablet, Take as directed, Disp: 21 tablet, Rfl: 0 .  phentermine 30 MG capsule, TAKE 1 CAPSULE BY MOUTH DAILY, Disp: 90 capsule, Rfl: 1 .  SYNTHROID 125 MCG tablet, TAKE 1 TABLET (125 MCG TOTAL) BY MOUTH DAILY., Disp: 90 tablet, Rfl: 1  EXAM:  Vitals:   01/08/17 1118  BP: 110/80  Pulse: (!) 107  Temp: 97.7 F (36.5 C)    Body mass index  is 26.52 kg/m.  GENERAL: vitals reviewed and listed above, alert, oriented, appears to feel poorly, appear dehydrated  HEENT: atraumatic, conjunttiva clear, no obvious abnormalities on inspection of external nose and ears  NECK: no obvious masses on inspection  LUNGS: clear to auscultation bilaterally, no wheezes, rales or rhonchi, good air movement, mildly elevated resp rate, O2 sats 88-95 depending on activity level  ABD: BS + all 4 quadrants, hyperactive, soft, difuse mild TTP  CV: HRRR, no peripheral edema  MS: moves all extremities without noticeable abnormality  PSYCH: pleasant and cooperative, no obvious depression or anxiety  ASSESSMENT AND PLAN:  Discussed the following assessment and plan:  Diarrhea, unspecified type  Dyspnea, unspecified type  Hypoxia  Dizziness  Dehydration  -she will  needs urgent labs/CXR/other and hydration -she does not feel well enough to go home and hydrate orally and feels like she is going to fall when she ambulates -concern for dehydration, CAP, sepsis vs other - opted for hospital evaluation and IVFs -she refused EMS transport,agrees to go straight to ER -advised assistant to notify ER personel -Patient advised to return or notify a doctor immediately if symptoms worsen or persist or new concerns arise.  There are no Patient Instructions on file for this visit.  Colin Benton R., DO

## 2017-01-08 NOTE — ED Notes (Signed)
Patient transported to X-ray 

## 2017-01-08 NOTE — Progress Notes (Signed)
Pharmacy Antibiotic Note  Katelyn [name] is a 61 y.o. female admitted on 01/08/2017 with colitis.  Pharmacy has been consulted for Meropenem dosing. Of note, patient has a penicillin allergy. Dr. Maryland Pink aware of potential for cross-reactivity, and is okay to proceed with order with careful monitoring. Discussed with RN in ED and on 5E, who will monitor patient closely for reaction.  Plan: Meropenem 1g IV q8h. Monitor renal function, cultures as available, clinical course.    Temp (24hrs), Avg:98 F (36.7 C), Min:97.7 F (36.5 C), Max:98.2 F (36.8 C)   Recent Labs Lab 01/08/17 1218  WBC 10.2  CREATININE 1.06*    Estimated Creatinine Clearance: 60.3 mL/min (by C-G formula based on SCr of 1.06 mg/dL (H)).    Allergies  Allergen Reactions  . Adhesive [Tape] Swelling and Other (See Comments)    Reaction:  Localized swelling   . Ciprofloxacin Hives and Shortness Of Breath  . Dimethicone Hives and Shortness Of Breath  . Erythromycin Hives and Shortness Of Breath  . Latex Hives and Shortness Of Breath  . Other Hives, Shortness Of Breath and Other (See Comments)    Pt is allergic to nitrile gloves.   . Penicillins Hives, Shortness Of Breath and Other (See Comments)    Has patient had a PCN reaction causing immediate rash, facial/tongue/throat swelling, SOB or lightheadedness with hypotension: Yes Has patient had a PCN reaction causing severe rash involving mucus membranes or skin necrosis: No Has patient had a PCN reaction that required hospitalization No Has patient had a PCN reaction occurring within the last 10 years: No If all of the above answers are "NO", then may proceed with Cephalosporin use.  . Red Dye Hives and Shortness Of Breath  . Shellfish Allergy Hives and Shortness Of Breath  . Sulfonamide Derivatives Hives and Shortness Of Breath  . Alcohol-Sulfur [Sulfur] Swelling and Other (See Comments)    Reaction:  Localized swelling   . Aspirin Nausea And Vomiting  .  Doxycycline Nausea And Vomiting    Antimicrobials this admission: 2/27 >> Meropenem >>  Dose adjustments this admission: --  Microbiology results: None ordered at this time  Thank you for allowing pharmacy to be a part of this patient's care.   Lindell Spar, PharmD, BCPS Pager: [phone] 01/08/2017 6:01 PM

## 2017-01-08 NOTE — ED Notes (Signed)
Pt reports that she is allergic to stickers for cardiac monitoring. Pt states that staff has to remove stickers right after placement.

## 2017-01-08 NOTE — ED Notes (Signed)
Patient transported to CT 

## 2017-01-08 NOTE — ED Notes (Signed)
PT have been made aware PT currently not on cardiac monitor.

## 2017-01-08 NOTE — ED Provider Notes (Signed)
[facility] DEPT Provider Note   CSN: [id number] Arrival date & time: 01/08/17  1148     History   Chief Complaint Chief Complaint  Patient presents with  . Emesis  . Diarrhea    HPI Katelyn [name] is a 61 y.o. female.  Patient with history of appendectomy, hysterectomy, unilateral oophorectomy -- presents with three-day history of vomiting and diarrhea. Patient was seen by her primary care physician today and sent to the emergency department over concerns of dehydration and shortness of breath with activity. Patient has had multiple episodes of vomiting and nonbloody stools. Patient had the flu 2 weeks ago which improved. Patient initially thought that she had food poisoning. She also reports a sensation of dizziness described as spinning worse with walking. In addition, she has some chest tightness with activity and becomes short of breath quickly. No treatments PTA. Unable to tolerate meds or liquids. No h/o CAD. Patient denies risk factors for pulmonary embolism including: unilateral leg swelling, history of DVT/PE/other blood clots, use of exogenous hormones, recent immobilizations, recent surgery, recent travel (>4hr segment), malignancy, hemoptysis.        Past Medical History:  Diagnosis Date  . Anemia   . Chronic kidney disease    stones  . Headache(784.0)    cluster headaches, migraines- states is taking Atenolol at present as directed per Dr Lacinda Axon  to prevent migraine from increased stress-  states if on medication for several weeks that heart rate will drop to 40's  . HYPOTHYROIDISM 05/27/2007  . Pneumonia    post op x 2  . PONV (postoperative nausea and vomiting)   . WEIGHT GAIN 07/15/2009    Patient Active Problem List   Diagnosis Date Noted  . Anterior chest wall pain 05/26/2015  . Hypertension 10/18/2011  . Nephrolithiasis 10/18/2011  . Ovarian mass 10/18/2011  . Pulmonary nodule, right 10/18/2011  . WEIGHT GAIN 07/15/2009  . Hypothyroidism  05/27/2007    Past Surgical History:  Procedure Laterality Date  . ABDOMINAL HYSTERECTOMY    . APPENDECTOMY    . DIAGNOSTIC LAPAROSCOPY    . FOOT SURGERY  2007   tumor removed from right foot/    removal Mortons neuroma    years ago  . KNEE ARTHROSCOPY     x 2  . LAPAROTOMY  10/30/2011   Procedure: EXPLORATORY LAPAROTOMY;  Surgeon: Janie Morning, MD;  Location: [facility];  Service: Gynecology;  Laterality: N/A;  . SALPINGOOPHORECTOMY  10/30/2011   Procedure: SALPINGO OOPHERECTOMY;  Surgeon: Janie Morning, MD;  Location: [facility];  Service: Gynecology;  Laterality: Right;  RIGHT  . TONSILLECTOMY    . TUBAL LIGATION    . TYMPANOSTOMY TUBE PLACEMENT      OB History    No data available       Home Medications    Prior to Admission medications   Medication Sig Start Date End Date Taking? Authorizing Provider  albuterol (PROVENTIL HFA;VENTOLIN HFA) 108 (90 Base) MCG/ACT inhaler Inhale 2 puffs into the lungs every 6 (six) hours as needed for wheezing or shortness of breath. 09/27/16   Marletta Lor, MD  atenolol (TENORMIN) 25 MG tablet TAKE 1 TABLET (25 MG TOTAL) BY MOUTH EVERY MORNING. FOR MIGRAINES 12/31/16   Marletta Lor, MD  cholecalciferol (VITAMIN D) 1000 UNITS tablet Take 1,000 Units by mouth daily.     Historical Provider, MD  Cyanocobalamin (VITAMIN B-12) 2500 MCG SUBL Place 1 tablet under the tongue daily.     Historical Provider, MD  methylPREDNISolone (MEDROL DOSEPAK) 4 MG TBPK tablet Take as directed 10/24/16   Dorothyann Peng, NP  phentermine 30 MG capsule TAKE 1 CAPSULE BY MOUTH DAILY 10/12/16   Marletta Lor, MD  SYNTHROID 125 MCG tablet TAKE 1 TABLET (125 MCG TOTAL) BY MOUTH DAILY. 12/31/16   Marletta Lor, MD    Family History Family History  Problem Relation Age of Onset  . Alzheimer's disease Mother   . Cervical cancer Mother   . Diabetes Sister   . Heart attack Brother   . Hypertension Neg Hx     family hx  . Heart disease Neg Hx      family hx    Social History Social History  Substance Use Topics  . Smoking status: Never Smoker  . Smokeless tobacco: Never Used  . Alcohol use No     Allergies   Adhesive [tape]; Dimethicone; Latex; Penicillins; Shellfish allergy; Sulfonamide derivatives; Alcohol-sulfur [sulfur]; Aspirin; Ciprofloxacin; Doxycycline; Erythromycin; and Red dye   Review of Systems Review of Systems  Constitutional: Negative for fever.  HENT: Negative for rhinorrhea and sore throat.   Eyes: Negative for redness.  Respiratory: Positive for chest tightness and shortness of breath. Negative for cough and wheezing.   Cardiovascular: Negative for chest pain.  Gastrointestinal: Positive for abdominal pain (generalized, worse on left), diarrhea, nausea and vomiting. Negative for blood in stool.  Genitourinary: Negative for dysuria.  Musculoskeletal: Negative for myalgias.  Skin: Negative for rash.  Neurological: Negative for headaches.     Physical Exam Updated Vital Signs BP (!) 158/103 (BP Location: Right Arm)   Pulse 109   Temp 98.2 F (36.8 C) (Oral)   Resp 18   SpO2 98%   Physical Exam  Constitutional: She appears well-developed and well-nourished.  HENT:  Head: Normocephalic and atraumatic.  Dry mucous membranes  Eyes: Conjunctivae are normal. Right eye exhibits no discharge. Left eye exhibits no discharge.  Neck: Normal range of motion. Neck supple.  Cardiovascular: Regular rhythm and normal heart sounds.  Tachycardia present.   No murmur heard. Pulmonary/Chest: Effort normal and breath sounds normal. No respiratory distress. She has no wheezes. She has no rales.  Abdominal: Soft. There is tenderness. There is no rebound and no guarding.  Musculoskeletal: She exhibits no edema or tenderness.  Neurological: She is alert.  Skin: Skin is warm and dry.  Psychiatric: She has a normal mood and affect.  Nursing note and vitals reviewed.    ED Treatments / Results  Labs (all labs  ordered are listed, but only abnormal results are displayed) Labs Reviewed  COMPREHENSIVE METABOLIC PANEL - Abnormal; Notable for the following:       Result Value   Potassium 2.7 (*)    Glucose, Bld 121 (*)    Creatinine, Ser 1.06 (*)    GFR calc non Af Amer 56 (*)    All other components within normal limits  CBC - Abnormal; Notable for the following:    RBC 5.88 (*)    Hemoglobin 17.1 (*)    HCT 48.0 (*)    All other components within normal limits  CBG MONITORING, ED - Abnormal; Notable for the following:    Glucose-Capillary 118 (*)    All other components within normal limits  LIPASE, BLOOD  URINALYSIS, ROUTINE W REFLEX MICROSCOPIC  I-STAT TROPOININ, ED     Radiology Dg Chest 2 View  Result Date: 01/08/2017 CLINICAL DATA:  Nausea, vomiting, shortness of breath, body aches and fever EXAM: CHEST  2  VIEW COMPARISON:  Chest x-ray of 10/24/2016 FINDINGS: Linear scarring is noted in the right middle lobe anteriorly. No active infiltrate or effusion is seen. Mediastinal and hilar contours are unremarkable. The heart is within upper limits of normal. There are mild degenerative changes in the mid to lower thoracic spine. IMPRESSION: No active cardiopulmonary disease. Electronically Signed   By: Ivar Drape M.D.   On: 01/08/2017 14:08    Procedures Procedures (including critical care time)  Medications Ordered in ED Medications  sodium chloride 0.9 % bolus 1,000 mL (1,000 mLs Intravenous New Bag/Given 01/08/17 1538)  iopamidol (ISOVUE-300) 61 % injection (not administered)  cefTRIAXone (ROCEPHIN) 2 g in dextrose 5 % 50 mL IVPB (not administered)    And  metroNIDAZOLE (FLAGYL) tablet 500 mg (not administered)  ondansetron (ZOFRAN-ODT) disintegrating tablet 4 mg (4 mg Oral Given 01/08/17 1256)  sodium chloride 0.9 % bolus 1,000 mL (1,000 mLs Intravenous New Bag/Given 01/08/17 1428)  ondansetron (ZOFRAN) injection 4 mg (4 mg Intravenous Given 01/08/17 1429)  sodium chloride 0.9 %  1,000 mL with potassium chloride 80 mEq infusion ( Intravenous Given 01/08/17 1428)  iopamidol (ISOVUE-300) 61 % injection 100 mL (100 mLs Intravenous Contrast Given 01/08/17 1453)     Initial Impression / Assessment and Plan / ED Course  I have reviewed the triage vital signs and the nursing notes.  Pertinent labs & imaging results that were available during my care of the patient were reviewed by me and considered in my medical decision making (see chart for details).     Patient seen and examined. Work-up initiated. Medications ordered. Potassium noted.   ED ECG REPORT   Date: 01/08/2017  Rate: 100   Rhythm: sinus tachycardia  QRS Axis: left  Intervals: normal  ST/T Wave abnormalities: normal  Conduction Disutrbances:none  Narrative Interpretation: poor R wave progression  Old EKG Reviewed: unchanged  I have personally reviewed the EKG tracing and agree with the computerized printout as noted.   Vital signs reviewed and are as follows: BP (!) 158/103 (BP Location: Right Arm)   Pulse 109   Temp 98.2 F (36.8 C) (Oral)   Resp 18   SpO2 98%   4:06 PM Patient with 2 episodes of vomiting since antiemetics. Updated on results. Will admit for colitis, hypokalemia, dehydration, IV abx.   Discussed with Dr. Leonette Monarch.   4:12 PM Spoke with Dr. Maryland Pink who will see.   Final Clinical Impressions(s) / ED Diagnoses   Final diagnoses:  Hypokalemia  Colitis  Dehydration  Intractable vomiting with nausea, unspecified vomiting type   Admit.   New Prescriptions New Prescriptions   No medications on file     Carlisle Cater, PA-C 01/08/17 [place], MD 01/09/17 [phone]

## 2017-01-08 NOTE — ED Triage Notes (Signed)
Patient reports N/V/D with generalized weakness and dizziness x3 days. Denies abdominal pain, chest pain and SOB. Ambulatory to triage.

## 2017-01-08 NOTE — ED Notes (Signed)
Pt vomiting in lobby, informed triage nurse, zofran was administered.

## 2017-01-08 NOTE — ED Notes (Signed)
Pt. Ambulated with  No assist. Pt. Felt a little dizzy while sitting on the side of the bed. Pt. O2 was @ 100% while Pulse was 96 when walking. Nurse aware.

## 2017-01-08 NOTE — Progress Notes (Signed)
Pre visit review using our clinic review tool, if applicable. No additional management support is needed unless otherwise documented below in the visit note. 

## 2017-01-08 NOTE — H&P (Signed)
History and Physical  [name] [id number] DOB: Nov 06, 1956 DOA: 01/08/2017  Referring physician: Alecia Lemming, ER PA  PCP: Nyoka Cowden, MD  Outpatient Specialists: none Patient coming from: home & is able to ambulate without assistance  Chief Complaint: nausea and vomiting and diarrhea   HPI: Katelyn [name] is a 61 y.o. female with medical history significant for hypothyroidism and multiple drug and medical allergies who is an her usual good state of health when Saturday, 2/24, patient after dinner started having severe sudden onset generalized abdominal cramping pain along with diarrhea and vomiting. Her symptoms continued to persist over the next few days. There is not much that she can do that would relieve it. She lost all appetite. She did not really eat. There was no blood in her stool. She felt weaker and weaker. She went to see her PCP this morning who advised her to go to the emergency room, after she was noted to be significantly dehydrated.  ED Course: n the emergency room, patient was noted to have a mildly elevated creatinine of 1.06, an elevated hemoglobin of 17.1 indicating hemoconcentration and a potassium of 2.7.urinalysis also noted a strong UTI.chest x-ray was unremarkable and CT scan of the abdomen and pelvis noted diarrhea in the right and transverse colon and signs of colitis, most notably in the sigmoid area. patient was given supplemental potassium and hospitalists were called for further evaluation and admission.  Following admission emergency room, patient started having a cough. She thinks that this was after she vomited from drinking contrast. She's not had a cough up until she was in the emergency room  Review of Systems: Patient seen after arrival to floor . Pt complains of mild nausea, abdominal distention. She actually overall feeling better with currently noted diarrhea or vomiting. Her last episode of diarrhea was yesterday. She vomited earlier  in the emergency room when drinking contrast for her CT. She's not had any abdominal pain since earlier this morning. she does complain of cough now, nonproductive which started after she got here.  Pt denies any headaches, vision changes, dysphagia, chest pain, palpitations, shortness of breath, wheeze, hematuria, dysuria, constipation, focal extremity numbness, weakness or pain.  Review of systems are otherwise negative   Past Medical History:  Diagnosis Date  . Anemia   . Chronic kidney disease    stones  . Headache(784.0)    cluster headaches, migraines- states is taking Atenolol at present as directed per Dr Lacinda Axon  to prevent migraine from increased stress-  states if on medication for several weeks that heart rate will drop to 40's  . HYPOTHYROIDISM 05/27/2007  . Pneumonia    post op x 2  . PONV (postoperative nausea and vomiting)   . WEIGHT GAIN 07/15/2009   Past Surgical History:  Procedure Laterality Date  . ABDOMINAL HYSTERECTOMY    . APPENDECTOMY    . DIAGNOSTIC LAPAROSCOPY    . FOOT SURGERY  2007   tumor removed from right foot/    removal Mortons neuroma    years ago  . KNEE ARTHROSCOPY     x 2  . LAPAROTOMY  10/30/2011   Procedure: EXPLORATORY LAPAROTOMY;  Surgeon: Janie Morning, MD;  Location: [facility];  Service: Gynecology;  Laterality: N/A;  . SALPINGOOPHORECTOMY  10/30/2011   Procedure: SALPINGO OOPHERECTOMY;  Surgeon: Janie Morning, MD;  Location: [facility];  Service: Gynecology;  Laterality: Right;  RIGHT  . TONSILLECTOMY    . TUBAL LIGATION    . TYMPANOSTOMY TUBE  PLACEMENT      Social History:  reports that she has never smoked. She has never used smokeless tobacco. She reports that she does not drink alcohol or use drugs.   Allergies  Allergen Reactions  . Adhesive [Tape] Swelling and Other (See Comments)    Reaction:  Localized swelling   . Ciprofloxacin Hives and Shortness Of Breath  . Dimethicone Hives and Shortness Of Breath  . Erythromycin  Hives and Shortness Of Breath  . Latex Hives and Shortness Of Breath  . Other Hives, Shortness Of Breath and Other (See Comments)    Pt is allergic to nitrile gloves.   . Penicillins Hives, Shortness Of Breath and Other (See Comments)    Has patient had a PCN reaction causing immediate rash, facial/tongue/throat swelling, SOB or lightheadedness with hypotension: Yes Has patient had a PCN reaction causing severe rash involving mucus membranes or skin necrosis: No Has patient had a PCN reaction that required hospitalization No Has patient had a PCN reaction occurring within the last 10 years: No If all of the above answers are "NO", then may proceed with Cephalosporin use.  . Red Dye Hives and Shortness Of Breath  . Shellfish Allergy Hives and Shortness Of Breath  . Sulfonamide Derivatives Hives and Shortness Of Breath  . Alcohol-Sulfur [Sulfur] Swelling and Other (See Comments)    Reaction:  Localized swelling   . Aspirin Nausea And Vomiting  . Doxycycline Nausea And Vomiting    Family History  Problem Relation Age of Onset  . Alzheimer's disease Mother   . Cervical cancer Mother   . Diabetes Sister   . Heart attack Brother   . Hypertension Neg Hx     family hx  . Heart disease Neg Hx     family hx      Prior to Admission medications   Medication Sig Start Date End Date Taking? Authorizing Provider  albuterol (PROVENTIL HFA;VENTOLIN HFA) 108 (90 Base) MCG/ACT inhaler Inhale 2 puffs into the lungs every 6 (six) hours as needed for wheezing or shortness of breath. 09/27/16  Yes Marletta Lor, MD  atenolol (TENORMIN) 25 MG tablet TAKE 1 TABLET (25 MG TOTAL) BY MOUTH EVERY MORNING. FOR MIGRAINES 12/31/16  Yes Marletta Lor, MD  cholecalciferol (VITAMIN D) 1000 UNITS tablet Take 1,000 Units by mouth daily.    Yes Historical Provider, MD  Cyanocobalamin (VITAMIN B-12) 2500 MCG SUBL Place 2,500 mcg under the tongue daily.    Yes Historical Provider, MD  phentermine 30 MG  capsule TAKE 1 CAPSULE BY MOUTH DAILY Patient taking differently: Take 30 mg by mouth daily.  10/12/16  Yes Marletta Lor, MD  SYNTHROID 125 MCG tablet TAKE 1 TABLET (125 MCG TOTAL) BY MOUTH DAILY. 12/31/16  Yes Marletta Lor, MD    Physical Exam: BP (!) 150/87 (BP Location: Left Arm) Comment: rn notify  Pulse 85   Temp 99.1 F (37.3 C) (Oral)   Resp 20   Ht 5\' 7"  (1.702 m)   Wt 78.4 kg (172 lb 12.8 oz)   SpO2 99%   BMI 27.06 kg/m   General:  Alert and oriented 3, no acute distress  Eyes: sclera nonicteric, extra ocular movements are intact  ENT: normocephalic, atraumatic, mucous members are dry  Neck: supple, no JVD  Cardiovascular: regular rate and rhythm, S1-S2  Respiratory: clear to auscultation bilaterally  Abdomen: soft, distended, hyperactive bowel sounds, nontender  Skin: no skin breaks, tears or lesions  Musculoskeletal: no clubbing or cyanosis  or edema  Psychiatric: patient is appropriate, no evidence of psychoses  Neurologic: no focal deficits           Labs on Admission:  Basic Metabolic Panel:  Recent Labs Lab 01/08/17 1218  NA 136  K 2.7*  CL 104  CO2 24  GLUCOSE 121*  BUN 14  CREATININE 1.06*  CALCIUM 8.9   Liver Function Tests:  Recent Labs Lab 01/08/17 1218  AST 26  ALT 29  ALKPHOS 88  BILITOT 0.8  PROT 8.1  ALBUMIN 4.4    Recent Labs Lab 01/08/17 1218  LIPASE 28   No results for input(s): AMMONIA in the last 168 hours. CBC:  Recent Labs Lab 01/08/17 1218  WBC 10.2  HGB 17.1*  HCT 48.0*  MCV 81.6  PLT 345   Cardiac Enzymes: No results for input(s): CKTOTAL, CKMB, CKMBINDEX, TROPONINI in the last 168 hours.  BNP (last 3 results) No results for input(s): BNP in the last 8760 hours.  ProBNP (last 3 results) No results for input(s): PROBNP in the last 8760 hours.  CBG:  Recent Labs Lab 01/08/17 1343  GLUCAP 118*    Radiological Exams on Admission: Dg Chest 2 View  Result Date: 01/08/2017 CLINICAL  DATA:  Nausea, vomiting, shortness of breath, body aches and fever EXAM: CHEST  2 VIEW COMPARISON:  Chest x-ray of 10/24/2016 FINDINGS: Linear scarring is noted in the right middle lobe anteriorly. No active infiltrate or effusion is seen. Mediastinal and hilar contours are unremarkable. The heart is within upper limits of normal. There are mild degenerative changes in the mid to lower thoracic spine. IMPRESSION: No active cardiopulmonary disease. Electronically Signed   By: Ivar Drape M.D.   On: 01/08/2017 14:08   Ct Abdomen Pelvis W Contrast  Result Date: 01/08/2017 CLINICAL DATA:  Left side abdominal pain, vomiting, diarrhea, nausea dizziness for 3 days EXAM: CT ABDOMEN AND PELVIS WITH CONTRAST TECHNIQUE: Multidetector CT imaging of the abdomen and pelvis was performed using the standard protocol following bolus administration of intravenous contrast. CONTRAST:  132mL ISOVUE-300 IOPAMIDOL (ISOVUE-300) INJECTION 61% COMPARISON:  08/30/2011 FINDINGS: Lower chest: Lung bases shows stable 5 mm nodule in right middle lobe probable benign in nature. No acute findings are noted lung bases. Hepatobiliary: There is fatty infiltration of the liver. No focal hepatic mass. No calcified gallstones are noted within gallbladder. Pancreas: Unremarkable. No pancreatic ductal dilatation or surrounding inflammatory changes. Spleen: Normal in size without focal abnormality. Adrenals/Urinary Tract: No adrenal gland mass. Enhanced kidneys are symmetrical in size. No hydronephrosis or hydroureter. Stable left parapelvic renal cysts. Stable small right parapelvic renal cysts. There is a cortical cyst in upper pole of the right kidney anteriorly measures 7 mm. Delayed renal images shows bilateral renal symmetrical excretion. Bilateral visualized proximal ureter is unremarkable. The urinary bladder is unremarkable. Bilateral distal ureter is unremarkable. No calcified calculi are noted within urinary bladder. Nonobstructive calcified  calculus in lower pole of the left kidney measures 1.7 mm. Stomach/Bowel: There is no gastric outlet obstruction. No small bowel obstruction. No transition point in caliber of small bowel. The terminal ileum is unremarkable. The patient is status post appendectomy. There is liquid stool and gas in right colon and transverse colon. Findings are highly suspicious for diarrhea. Some gas noted descending colon. Liquid stool noted within sigmoid colon. There is subtle enhancement of the sigmoid colon wall. Mild thickening of distal sigmoid colon wall and rectum please see axial image 79. Findings are highly suspicious for distal segmental  colitis. Mild focal narrowing of the lumen in mid sigmoid colon in right lower quadrant axial image 74 most likely due to colonic spasm. Please see coronal image 37. No definite evidence of mass. Follow-up colonoscopy in 4-6 weeks is recommended to exclude a colonic stricture. Vascular/Lymphatic: No aortic aneurysm. Mild atherosclerotic calcifications of abdominal aorta and iliac arteries. No adenopathy. No retroperitoneal adenopathy. Reproductive: The patient is status post hysterectomy. Other: No ascites or free abdominal air. Musculoskeletal: No destructive bony lesions are noted. Sagittal images of the spine shows mild degenerative changes lower thoracic spine. There is significant disc space flattening with vacuum disc phenomenon and mild posterior spurring at L5-S1 level. IMPRESSION: 1. There is fatty infiltration of the liver.  No focal hepatic mass. 2. Stable 5 mm lung nodule in right middle lobe probable benign in nature. 3. Stable bilateral renal parapelvic cysts left greater than right. Nonobstructive calculus in lower pole of the left kidney measures 1.7 mm. No hydronephrosis or hydroureter. 4. No small bowel obstruction. No pericecal inflammation. The patient is status post appendectomy. 5. There is diffuse liquid stool and some colonic gas throughout the colon. Findings  are highly suspicious for diffuse diarrhea. Subtle mild thickening of colonic wall and enhancing of colonic wall in distal sigmoid colon axial image 79. Findings are highly suspicious for distal segmental colitis. No pericolonic inflammation. Probable colonic spasm rather than colonic stricture in mid sigmoid colon in right lower quadrant. Follow-up colonoscopy in 6-8 weeks is recommended. No definite evidence of obstructing colonic mass. 6. Mild degenerative changes thoracolumbar spine. Disc space flattening with vacuum disc phenomenon at L5-S1 level. 7. No ascites or free abdominal air. Electronically Signed   By: Lahoma Crocker M.D.   On: 01/08/2017 15:21    EKG: Independently reviewed. Sinus tachycardia, right atrial enlargement   Assessment/Plan Present on Admission: . Colitis: nonspecific. Favor infectious versus ischemic given history. Patient has many allergies, so discussed with pharmacy and put patient on [place]. Keep on clear liquids for now. . Hypothyroidism: Stable continue Synthroid . Hypertension: Holding antihypertensives given nausea vomiting and dehydration. . Hypokalemia: Received partial IV supplementation and have added potassium in her IV fluids. . Fatty liver: Incidentally noted on CT UTI: Large number of white cells although no nitrite or bacteria. May be more cystitis. Regardless, Merrem should cover this.  Active Problems:   Hypothyroidism   Hypertension   Colitis   Hypokalemia   Fatty liver   DVT prophylaxis: Lovenox   Code Status: full code as per patient   Family Communication: daughter at the bedside   Disposition Plan: patient will likely be here for several days until the point where she is able to tolerate solid food and no further diarrhea   Consults called: none   Admission status: given that she will be in the hospital for several days, passed to midnight crying Hospital services, have admitted her as inpatient     Annita Brod MD [facility] Pager [phone]  If 7PM-7AM, please contact night-coverage [email or web address] Password [email or web address]  01/08/2017, 7:00 PM

## 2017-01-09 DIAGNOSIS — R112 Nausea with vomiting, unspecified: Secondary | ICD-10-CM | POA: Diagnosis not present

## 2017-01-09 DIAGNOSIS — K760 Fatty (change of) liver, not elsewhere classified: Secondary | ICD-10-CM

## 2017-01-09 DIAGNOSIS — E038 Other specified hypothyroidism: Secondary | ICD-10-CM

## 2017-01-09 DIAGNOSIS — A084 Viral intestinal infection, unspecified: Secondary | ICD-10-CM | POA: Diagnosis not present

## 2017-01-09 DIAGNOSIS — K529 Noninfective gastroenteritis and colitis, unspecified: Secondary | ICD-10-CM

## 2017-01-09 DIAGNOSIS — R197 Diarrhea, unspecified: Secondary | ICD-10-CM

## 2017-01-09 DIAGNOSIS — E860 Dehydration: Secondary | ICD-10-CM

## 2017-01-09 DIAGNOSIS — E876 Hypokalemia: Secondary | ICD-10-CM

## 2017-01-09 DIAGNOSIS — R933 Abnormal findings on diagnostic imaging of other parts of digestive tract: Secondary | ICD-10-CM

## 2017-01-09 DIAGNOSIS — I10 Essential (primary) hypertension: Secondary | ICD-10-CM

## 2017-01-09 LAB — BASIC METABOLIC PANEL
ANION GAP: 3 — AB (ref 5–15)
BUN: 5 mg/dL — AB (ref 6–20)
CO2: 24 mmol/L (ref 22–32)
Calcium: 7.9 mg/dL — ABNORMAL LOW (ref 8.9–10.3)
Chloride: 113 mmol/L — ABNORMAL HIGH (ref 101–111)
Creatinine, Ser: 0.68 mg/dL (ref 0.44–1.00)
GFR calc Af Amer: >60 mL/min (ref >60)
Glucose, Bld: 91 mg/dL (ref 65–99)
POTASSIUM: 2.9 mmol/L — AB (ref 3.5–5.1)
SODIUM: 140 mmol/L (ref 135–145)

## 2017-01-09 LAB — CBC
HEMATOCRIT: 37.5 % (ref 36.0–46.0)
Hemoglobin: 13.2 g/dL (ref 12.0–15.0)
MCH: 29.3 pg (ref 26.0–34.0)
MCHC: 35.2 g/dL (ref 30.0–36.0)
MCV: 83.1 fL (ref 78.0–100.0)
Platelets: 245 10*3/uL (ref 150–400)
RBC: 4.51 MIL/uL (ref 3.87–5.11)
RDW: 13.3 % (ref 11.5–15.5)
WBC: 5.7 10*3/uL (ref 4.0–10.5)

## 2017-01-09 LAB — MAGNESIUM: MAGNESIUM: 1.8 mg/dL (ref 1.7–2.4)

## 2017-01-09 MED ORDER — ATENOLOL 25 MG PO TABS
25.0000 mg | ORAL_TABLET | Freq: Every day | ORAL | Status: DC
  Administered 2017-01-09 – 2017-01-10 (×2): 25 mg via ORAL
  Filled 2017-01-09 (×2): qty 1

## 2017-01-09 MED ORDER — PROMETHAZINE HCL 25 MG PO TABS
25.0000 mg | ORAL_TABLET | Freq: Four times a day (QID) | ORAL | Status: DC | PRN
  Administered 2017-01-09 – 2017-01-11 (×5): 25 mg via ORAL
  Filled 2017-01-09 (×5): qty 1

## 2017-01-09 MED ORDER — SODIUM CHLORIDE 0.9 % IV SOLN
INTRAVENOUS | Status: DC
  Administered 2017-01-09 – 2017-01-11 (×3): via INTRAVENOUS
  Filled 2017-01-09 (×5): qty 1000

## 2017-01-09 MED ORDER — HYOSCYAMINE SULFATE 0.125 MG SL SUBL
0.2500 mg | SUBLINGUAL_TABLET | Freq: Four times a day (QID) | SUBLINGUAL | Status: DC
  Administered 2017-01-09 – 2017-01-10 (×5): 0.25 mg via SUBLINGUAL
  Filled 2017-01-09 (×9): qty 2

## 2017-01-09 MED ORDER — VITAMIN B-12 1000 MCG PO TABS
1000.0000 ug | ORAL_TABLET | Freq: Every day | ORAL | Status: DC
  Administered 2017-01-09 – 2017-01-10 (×2): 1000 ug via ORAL
  Filled 2017-01-09 (×2): qty 1

## 2017-01-09 MED ORDER — CLOTRIMAZOLE 1 % EX CREA
TOPICAL_CREAM | Freq: Two times a day (BID) | CUTANEOUS | Status: DC
  Administered 2017-01-09 – 2017-01-10 (×3): via TOPICAL
  Administered 2017-01-10: 1 via TOPICAL
  Filled 2017-01-09: qty 15

## 2017-01-09 NOTE — Progress Notes (Signed)
PROGRESS NOTE  Katelyn [name] [id number] DOB: 1956/01/12 DOA: 01/08/2017 PCP: Nyoka Cowden, MD  Brief History:  61 year old female with a history of hypothyroidism, hypertension presented with new onset diarrhea that began on 01/05/2017 associated with abdominal cramping. She feels that this may have been related to eating some expired soup. She has not been on any antibiotics. She stated she had 6-7 loose bowel movements daily prior to admission and continues to have watery stools after admission. The patient also complained of nausea and vomiting although she states that her emesis has improved. She had a low-grade temperature of 99.33F at home. Because of decreased oral intake and increased generalized weakness with persistent symptoms she saw her primary care provider on 01/08/2017. She was since, sent to the emergency department for further evaluation. She denies any hematochezia, melena, hematemesis, chest pain, short of breath, dysuria, hematuria. She states that she has not had endoscopy or colonoscopy in over 10 years. CT of the abdomen and pelvis showed liquid stool in the right and transverse colon with distal sigmoid and rectal wall thickening. There was also focal narrowing in the mid sigmoid colon. The patient was started on IV fluids and intravenous meropenem. Previous medical records reviewed and summarized.  Assessment/Plan: Diarrhea/colitis -Suspect infectious versus ischemic -Stool pathogen panel -Consult [facility]--spoke with Tye Savoy -Continue clear liquid diet -continue merrem for now  Hypothyroidism -Continue Synthroid  Dehydration -The patient was hemoconcentrated with elevated serum creatinine at the time of admission -Baseline creatinine 0.6-0.8 -Presenting creatinine 1.06 -continue IVF, add KCl to fluid -am BMP  Hypokalemia -Replete -Check magnesium  UTI -Continue meropenem presently being used for GI coverage -no urine  culture performed  Hypertension -Holding atenolol   Disposition Plan:   Home in 1-2 days  Family Communication:   No Family at bedside  Consultants:  [facility]  Code Status:  FULL  DVT Prophylaxis:  [place] Lovenox   Procedures: As Listed in Progress Note Above  Antibiotics: None    Subjective: Patient continues to have loose stools with crampy abdominal pain. She denies any vomiting but has some nausea. Denies any fevers, chills, headache, neck pain, chest pain, short of breath, dysuria, hematuria. No medication or melena. She states that she has had 5-6 loose bowel movements in the past 24 hours.  Objective: Vitals:   01/08/17 1541 01/08/17 1813 01/08/17 2146 01/09/17 0513  BP: 159/89 (!) 150/87 (!) 144/79 (!) 150/73  Pulse: 89 85 81 76  Resp: 18 20 18 16   Temp: 98 F (36.7 C) 99.1 F (37.3 C) 98.9 F (37.2 C) 98.7 F (37.1 C)  TempSrc: Oral Oral Oral Oral  SpO2: 100% 99% 98% 99%  Weight:  78.4 kg (172 lb 12.8 oz)    Height:  5\' 7"  (1.702 m)      Intake/Output Summary (Last 24 hours) at 01/09/17 1128 Last data filed at 01/09/17 0527  Gross per 24 hour  Intake             3900 ml  Output                0 ml  Net             3900 ml   Weight change:  Exam:   General:  Pt is alert, follows commands appropriately, not in acute distress  HEENT: No icterus, No thrush, No neck mass, [place]/AT  Cardiovascular: RRR, S1/S2, no rubs, no gallops  Respiratory:  CTA bilaterally, no wheezing, no crackles, no rhonchi  Abdomen: Soft/+BS, non tender, non distended, no guarding  Extremities: No edema, No lymphangitis, No petechiae, No rashes, no synovitis   Data Reviewed: I have personally reviewed following labs and imaging studies Basic Metabolic Panel:  Recent Labs Lab 01/08/17 1218 01/08/17 2219  NA 136  --   K 2.7*  --   CL 104  --   CO2 24  --   GLUCOSE 121*  --   BUN 14  --   CREATININE 1.06* 0.73  CALCIUM 8.9  --    Liver Function Tests:  Recent  Labs Lab 01/08/17 1218  AST 26  ALT 29  ALKPHOS 88  BILITOT 0.8  PROT 8.1  ALBUMIN 4.4    Recent Labs Lab 01/08/17 1218  LIPASE 28   No results for input(s): AMMONIA in the last 168 hours. Coagulation Profile: No results for input(s): INR, PROTIME in the last 168 hours. CBC:  Recent Labs Lab 01/08/17 1218  WBC 10.2  HGB 17.1*  HCT 48.0*  MCV 81.6  PLT 345   Cardiac Enzymes: No results for input(s): CKTOTAL, CKMB, CKMBINDEX, TROPONINI in the last 168 hours. BNP: Invalid input(s): POCBNP CBG:  Recent Labs Lab 01/08/17 1343  GLUCAP 118*   HbA1C: No results for input(s): HGBA1C in the last 72 hours. Urine analysis:    Component Value Date/Time   COLORURINE YELLOW 01/08/2017 1556   APPEARANCEUR HAZY (A) 01/08/2017 1556   LABSPEC 1.029 01/08/2017 1556   PHURINE 6.0 01/08/2017 1556   GLUCOSEU NEGATIVE 01/08/2017 1556   HGBUR MODERATE (A) 01/08/2017 1556   BILIRUBINUR NEGATIVE 01/08/2017 1556   KETONESUR NEGATIVE 01/08/2017 1556   PROTEINUR NEGATIVE 01/08/2017 1556   UROBILINOGEN 0.2 08/30/2011 2014   NITRITE NEGATIVE 01/08/2017 1556   LEUKOCYTESUR LARGE (A) 01/08/2017 1556   Sepsis Labs: @LABRCNTIP (procalcitonin:4,lacticidven:4) )No results found for this or any previous visit (from the past 240 hour(s)).   Scheduled Meds: . enoxaparin (LOVENOX) injection  40 mg Subcutaneous Q24H  . levothyroxine  125 mcg Oral QAC breakfast  . meropenem (MERREM) IV  1 g Intravenous Q8H   Continuous Infusions: . sodium chloride 100 mL/hr at 01/09/17 0730    Procedures/Studies: Dg Chest 2 View  Result Date: 01/08/2017 CLINICAL DATA:  Nausea, vomiting, shortness of breath, body aches and fever EXAM: CHEST  2 VIEW COMPARISON:  Chest x-ray of 10/24/2016 FINDINGS: Linear scarring is noted in the right middle lobe anteriorly. No active infiltrate or effusion is seen. Mediastinal and hilar contours are unremarkable. The heart is within upper limits of normal. There are mild  degenerative changes in the mid to lower thoracic spine. IMPRESSION: No active cardiopulmonary disease. Electronically Signed   By: Ivar Drape M.D.   On: 01/08/2017 14:08   Ct Abdomen Pelvis W Contrast  Result Date: 01/08/2017 CLINICAL DATA:  Left side abdominal pain, vomiting, diarrhea, nausea dizziness for 3 days EXAM: CT ABDOMEN AND PELVIS WITH CONTRAST TECHNIQUE: Multidetector CT imaging of the abdomen and pelvis was performed using the standard protocol following bolus administration of intravenous contrast. CONTRAST:  138mL ISOVUE-300 IOPAMIDOL (ISOVUE-300) INJECTION 61% COMPARISON:  08/30/2011 FINDINGS: Lower chest: Lung bases shows stable 5 mm nodule in right middle lobe probable benign in nature. No acute findings are noted lung bases. Hepatobiliary: There is fatty infiltration of the liver. No focal hepatic mass. No calcified gallstones are noted within gallbladder. Pancreas: Unremarkable. No pancreatic ductal dilatation or surrounding inflammatory changes. Spleen: Normal in size without focal  abnormality. Adrenals/Urinary Tract: No adrenal gland mass. Enhanced kidneys are symmetrical in size. No hydronephrosis or hydroureter. Stable left parapelvic renal cysts. Stable small right parapelvic renal cysts. There is a cortical cyst in upper pole of the right kidney anteriorly measures 7 mm. Delayed renal images shows bilateral renal symmetrical excretion. Bilateral visualized proximal ureter is unremarkable. The urinary bladder is unremarkable. Bilateral distal ureter is unremarkable. No calcified calculi are noted within urinary bladder. Nonobstructive calcified calculus in lower pole of the left kidney measures 1.7 mm. Stomach/Bowel: There is no gastric outlet obstruction. No small bowel obstruction. No transition point in caliber of small bowel. The terminal ileum is unremarkable. The patient is status post appendectomy. There is liquid stool and gas in right colon and transverse colon. Findings are  highly suspicious for diarrhea. Some gas noted descending colon. Liquid stool noted within sigmoid colon. There is subtle enhancement of the sigmoid colon wall. Mild thickening of distal sigmoid colon wall and rectum please see axial image 79. Findings are highly suspicious for distal segmental colitis. Mild focal narrowing of the lumen in mid sigmoid colon in right lower quadrant axial image 74 most likely due to colonic spasm. Please see coronal image 37. No definite evidence of mass. Follow-up colonoscopy in 4-6 weeks is recommended to exclude a colonic stricture. Vascular/Lymphatic: No aortic aneurysm. Mild atherosclerotic calcifications of abdominal aorta and iliac arteries. No adenopathy. No retroperitoneal adenopathy. Reproductive: The patient is status post hysterectomy. Other: No ascites or free abdominal air. Musculoskeletal: No destructive bony lesions are noted. Sagittal images of the spine shows mild degenerative changes lower thoracic spine. There is significant disc space flattening with vacuum disc phenomenon and mild posterior spurring at L5-S1 level. IMPRESSION: 1. There is fatty infiltration of the liver.  No focal hepatic mass. 2. Stable 5 mm lung nodule in right middle lobe probable benign in nature. 3. Stable bilateral renal parapelvic cysts left greater than right. Nonobstructive calculus in lower pole of the left kidney measures 1.7 mm. No hydronephrosis or hydroureter. 4. No small bowel obstruction. No pericecal inflammation. The patient is status post appendectomy. 5. There is diffuse liquid stool and some colonic gas throughout the colon. Findings are highly suspicious for diffuse diarrhea. Subtle mild thickening of colonic wall and enhancing of colonic wall in distal sigmoid colon axial image 79. Findings are highly suspicious for distal segmental colitis. No pericolonic inflammation. Probable colonic spasm rather than colonic stricture in mid sigmoid colon in right lower quadrant.  Follow-up colonoscopy in 6-8 weeks is recommended. No definite evidence of obstructing colonic mass. 6. Mild degenerative changes thoracolumbar spine. Disc space flattening with vacuum disc phenomenon at L5-S1 level. 7. No ascites or free abdominal air. Electronically Signed   By: Lahoma Crocker M.D.   On: 01/08/2017 15:21    Estrella Alcaraz, DO  [facility] Pager [phone]  If 7PM-7AM, please contact night-coverage [email or web address] Password [email or web address] 01/09/2017, 11:28 AM   LOS: 1 day

## 2017-01-09 NOTE — Consult Note (Signed)
Referring Provider: [facility]  Primary Care Physician:  Nyoka Cowden, MD Primary Gastroenterologist:   Previously Lyla Son, MD  Reason for Consultation:  Diarrhea, abnormal CTscan  ASSESSMENT AND PLAN:    8. 61 yo female with acute nausea, vomiting, and crampy diarrhea. CTscan suggests segmental descending colon colitis. Suspect this is infectious in nature. She inadvertently used outdated chicken broth to make soup on Friday and sx started on Saturday. Stool pathogen panel is pending.  -add C-diff which isn't included in the pathogen panel. No significant C-diff risk factors but on Merrem which does have some anaerobic coverage.  -If stool studies return negative then will probably proceed with a colonoscopy on Friday. It has been > 12 year since her last colonoscopy. Patient unable to tolerate a prep which is why she hasn't returned for a colonoscopy. With all the diarrhea it is likely that she would be easy to clean out at this point.  -Unable to take Bentyl - red dye allergy leading to hives / SOB -Zofran not relieving the nausea. Will try PO Phenergan  2. Possible UTI, WBC are TNTC on u/a. Primary team has her on [place].   3. Multiple allergies  4. Hypokalemia, K+ 2.9. Repletion in progress  HPI: Katelyn [name] is a 61 y.o. female evaluated yesterday by PCP for diarrhea, vomiting, and weakness. On Friday she consumed chicken broth which had been opened since prior to Christmas (though refrigerated). On Saturday she developed acute nausea, vomiting, crampy diarrhea. She had multiple daytime and nocturnal episodes Sat - yesterday.  No recent antibiotics or travel. Saw PCP yesterday, sent to ED for further evaluation and treatment.  CTscan reveals liquid stool in colon , probable distal segmental colitis, mid descending colon spasm, subtle mild thickening of distal sigmoid and mild focal narrowing of lumen in mid sigmoid, likely a spasm. She hasn't seen any blood in  stool. She reports fevers at home. Since acute illness her appetite has been poor.  Hgb initially 17, likely from volume depletion and now back to baseline of 13.2. Stool studies pending. U/A with TNTC WBCs. Today she is still nauseated and so far has had 4 loose BMs  Patient has longstanding D-IBS. She manages symptoms which usually include loose stool without cramps. Her weight has been stable. No [place] of IBD nor colon cancer.    Past Medical History:  Diagnosis Date  . Anemia   . Chronic kidney disease    stones  . Headache(784.0)    cluster headaches, migraines- states is taking Atenolol at present as directed per Dr Lacinda Axon  to prevent migraine from increased stress-  states if on medication for several weeks that heart rate will drop to 40's  . HYPOTHYROIDISM 05/27/2007  . Pneumonia    post op x 2  . PONV (postoperative nausea and vomiting)     Past Surgical History:  Procedure Laterality Date  . ABDOMINAL HYSTERECTOMY    . APPENDECTOMY    . DIAGNOSTIC LAPAROSCOPY    . FOOT SURGERY  2007   tumor removed from right foot/    removal Mortons neuroma    years ago  . KNEE ARTHROSCOPY     x 2  . LAPAROTOMY  10/30/2011   Procedure: EXPLORATORY LAPAROTOMY;  Surgeon: Janie Morning, MD;  Location: [facility];  Service: Gynecology;  Laterality: N/A;  . SALPINGOOPHORECTOMY  10/30/2011   Procedure: SALPINGO OOPHERECTOMY;  Surgeon: Janie Morning, MD;  Location: [facility];  Service: Gynecology;  Laterality: Right;  RIGHT  .  TONSILLECTOMY    . TUBAL LIGATION    . TYMPANOSTOMY TUBE PLACEMENT      Prior to Admission medications   Medication Sig Start Date End Date Taking? Authorizing Provider  albuterol (PROVENTIL HFA;VENTOLIN HFA) 108 (90 Base) MCG/ACT inhaler Inhale 2 puffs into the lungs every 6 (six) hours as needed for wheezing or shortness of breath. 09/27/16  Yes Marletta Lor, MD  atenolol (TENORMIN) 25 MG tablet TAKE 1 TABLET (25 MG TOTAL) BY MOUTH EVERY MORNING. FOR MIGRAINES  12/31/16  Yes Marletta Lor, MD  cholecalciferol (VITAMIN D) 1000 UNITS tablet Take 1,000 Units by mouth daily.    Yes Historical Provider, MD  Cyanocobalamin (VITAMIN B-12) 2500 MCG SUBL Place 2,500 mcg under the tongue daily.    Yes Historical Provider, MD  phentermine 30 MG capsule TAKE 1 CAPSULE BY MOUTH DAILY Patient taking differently: Take 30 mg by mouth daily.  10/12/16  Yes Marletta Lor, MD  SYNTHROID 125 MCG tablet TAKE 1 TABLET (125 MCG TOTAL) BY MOUTH DAILY. 12/31/16  Yes Marletta Lor, MD    Current Facility-Administered Medications  Medication Dose Route Frequency Provider Last Rate Last Dose  . acetaminophen (TYLENOL) tablet 650 mg  650 mg Oral Q6H PRN Annita Brod, MD       Or  . acetaminophen (TYLENOL) suppository 650 mg  650 mg Rectal Q6H PRN Annita Brod, MD      . albuterol (PROVENTIL) (2.5 MG/3ML) 0.083% nebulizer solution 2.5 mg  2.5 mg Nebulization Q6H PRN Annita Brod, MD      . clotrimazole (LOTRIMIN) 1 % cream   Topical BID Orson Eva, MD      . enoxaparin (LOVENOX) injection 40 mg  40 mg Subcutaneous Q24H Annita Brod, MD      . levothyroxine (SYNTHROID, LEVOTHROID) tablet 125 mcg  125 mcg Oral QAC breakfast Annita Brod, MD   125 mcg at 01/09/17 [id number]  . meropenem (MERREM) 1 g in sodium chloride 0.9 % 100 mL IVPB  1 g Intravenous Q8H Annita Brod, MD   1 g at 01/09/17 1204  . ondansetron (ZOFRAN) tablet 4 mg  4 mg Oral Q6H PRN Annita Brod, MD       Or  . ondansetron [facility]) injection 4 mg  4 mg Intravenous Q6H PRN Annita Brod, MD   4 mg at 01/09/17 1203  . sodium chloride 0.9 % 1,000 mL with potassium chloride 40 mEq infusion   Intravenous Continuous Orson Eva, MD      . traMADol Veatrice Bourbon) tablet 50 mg  50 mg Oral Q6H PRN Annita Brod, MD   50 mg at 01/09/17 0859    Allergies as of 01/08/2017 - Review Complete 01/08/2017  Allergen Reaction Noted  . Adhesive [tape] Swelling and Other (See Comments)  10/25/2011  . Ciprofloxacin Hives and Shortness Of Breath   . Dimethicone Hives and Shortness Of Breath 10/23/2011  . Erythromycin Hives and Shortness Of Breath   . Latex Hives and Shortness Of Breath   . Other Hives, Shortness Of Breath, and Other (See Comments) 01/08/2017  . Penicillins Hives, Shortness Of Breath, and Other (See Comments)   . Red dye Hives and Shortness Of Breath 10/25/2011  . Shellfish allergy Hives and Shortness Of Breath 10/25/2011  . Sulfonamide derivatives Hives and Shortness Of Breath   . Alcohol-sulfur [sulfur] Swelling and Other (See Comments) 10/25/2011  . Aspirin Nausea And Vomiting   . Doxycycline Nausea And Vomiting  05/22/2012    Family History  Problem Relation Age of Onset  . Alzheimer's disease Mother   . Cervical cancer Mother   . Diabetes Sister   . Heart attack Brother   . Hypertension Neg Hx     family hx  . Heart disease Neg Hx     family hx    Social History   Social History  . Marital status: Married    Spouse name: N/A  . Number of children: N/A  . Years of education: N/A   Occupational History  . Not on file.   Social History Main Topics  . Smoking status: Never Smoker  . Smokeless tobacco: Never Used  . Alcohol use No  . Drug use: No  . Sexual activity: Yes   Other Topics Concern  . Not on file   Social History Narrative  . No narrative on file    Review of Systems: All systems reviewed and negative except where noted in HPI.  Physical Exam: Vital signs in last 24 hours: Temp:  [98 F (36.7 C)-99.1 F (37.3 C)] 98.7 F (37.1 C) (02/28 0513) Pulse Rate:  [76-89] 76 (02/28 0513) Resp:  [16-20] 16 (02/28 0513) BP: (144-159)/(73-89) 150/73 (02/28 0513) SpO2:  [98 %-100 %] 99 % (02/28 0513) Weight:  [172 lb 12.8 oz (78.4 kg)] 172 lb 12.8 oz (78.4 kg) (02/27 1813) Last BM Date: 01/09/17 General:   Alert,  well-developed, pleasant and cooperative in NAD Eyes:  Sclera clear, no icterus.   Conjunctiva pink. Ears:   Normal auditory acuity. Nose:  No deformity, discharge,  or lesions. Neck:  Supple; no masses Lungs:  Clear throughout to auscultation.   No wheezes, crackles, or rhonchi.  Heart:  Regular rate and rhythm; no murmurs, clicks, rubs,  or gallops. Abdomen:  Soft,nontender, BS active,nonpalp mass or hsm.   Rectal:  Deferred  Msk:  Symmetrical without gross deformities. . Pulses:  Normal pulses noted. Extremities:  Without clubbing or edema. Neurologic:  Alert and  oriented x4;  grossly normal neurologically. Skin:  Intact without significant lesions or rashes.. Psych:  Alert and cooperative. Normal mood and affect.  Intake/Output from previous day: 02/27 0701 - 02/28 0700 In: 3900 [I.V.:800; IV Piggyback:3100] Out: -  Intake/Output this shift: No intake/output data recorded.  Lab Results:  Recent Labs  01/08/17 1218 01/09/17 1215  WBC 10.2 5.7  HGB 17.1* 13.2  HCT 48.0* 37.5  PLT 345 245   BMET  Recent Labs  01/08/17 1218 01/08/17 2219 01/09/17 1215  NA 136  --  140  K 2.7*  --  2.9*  CL 104  --  113*  CO2 24  --  24  GLUCOSE 121*  --  91  BUN 14  --  5*  CREATININE 1.06* 0.73 0.68  CALCIUM 8.9  --  7.9*   LFT  Recent Labs  01/08/17 1218  PROT 8.1  ALBUMIN 4.4  AST 26  ALT 29  ALKPHOS 88  BILITOT 0.8    Studies/Results: Dg Chest 2 View  Result Date: 01/08/2017 CLINICAL DATA:  Nausea, vomiting, shortness of breath, body aches and fever EXAM: CHEST  2 VIEW COMPARISON:  Chest x-ray of 10/24/2016 FINDINGS: Linear scarring is noted in the right middle lobe anteriorly. No active infiltrate or effusion is seen. Mediastinal and hilar contours are unremarkable. The heart is within upper limits of normal. There are mild degenerative changes in the mid to lower thoracic spine. IMPRESSION: No active cardiopulmonary disease. Electronically Signed  By: Ivar Drape M.D.   On: 01/08/2017 14:08   Ct Abdomen Pelvis W Contrast  Result Date: 01/08/2017 CLINICAL DATA:   Left side abdominal pain, vomiting, diarrhea, nausea dizziness for 3 days EXAM: CT ABDOMEN AND PELVIS WITH CONTRAST TECHNIQUE: Multidetector CT imaging of the abdomen and pelvis was performed using the standard protocol following bolus administration of intravenous contrast. CONTRAST:  180mL ISOVUE-300 IOPAMIDOL (ISOVUE-300) INJECTION 61% COMPARISON:  08/30/2011 FINDINGS: Lower chest: Lung bases shows stable 5 mm nodule in right middle lobe probable benign in nature. No acute findings are noted lung bases. Hepatobiliary: There is fatty infiltration of the liver. No focal hepatic mass. No calcified gallstones are noted within gallbladder. Pancreas: Unremarkable. No pancreatic ductal dilatation or surrounding inflammatory changes. Spleen: Normal in size without focal abnormality. Adrenals/Urinary Tract: No adrenal gland mass. Enhanced kidneys are symmetrical in size. No hydronephrosis or hydroureter. Stable left parapelvic renal cysts. Stable small right parapelvic renal cysts. There is a cortical cyst in upper pole of the right kidney anteriorly measures 7 mm. Delayed renal images shows bilateral renal symmetrical excretion. Bilateral visualized proximal ureter is unremarkable. The urinary bladder is unremarkable. Bilateral distal ureter is unremarkable. No calcified calculi are noted within urinary bladder. Nonobstructive calcified calculus in lower pole of the left kidney measures 1.7 mm. Stomach/Bowel: There is no gastric outlet obstruction. No small bowel obstruction. No transition point in caliber of small bowel. The terminal ileum is unremarkable. The patient is status post appendectomy. There is liquid stool and gas in right colon and transverse colon. Findings are highly suspicious for diarrhea. Some gas noted descending colon. Liquid stool noted within sigmoid colon. There is subtle enhancement of the sigmoid colon wall. Mild thickening of distal sigmoid colon wall and rectum please see axial image 79.  Findings are highly suspicious for distal segmental colitis. Mild focal narrowing of the lumen in mid sigmoid colon in right lower quadrant axial image 74 most likely due to colonic spasm. Please see coronal image 37. No definite evidence of mass. Follow-up colonoscopy in 4-6 weeks is recommended to exclude a colonic stricture. Vascular/Lymphatic: No aortic aneurysm. Mild atherosclerotic calcifications of abdominal aorta and iliac arteries. No adenopathy. No retroperitoneal adenopathy. Reproductive: The patient is status post hysterectomy. Other: No ascites or free abdominal air. Musculoskeletal: No destructive bony lesions are noted. Sagittal images of the spine shows mild degenerative changes lower thoracic spine. There is significant disc space flattening with vacuum disc phenomenon and mild posterior spurring at L5-S1 level. IMPRESSION: 1. There is fatty infiltration of the liver.  No focal hepatic mass. 2. Stable 5 mm lung nodule in right middle lobe probable benign in nature. 3. Stable bilateral renal parapelvic cysts left greater than right. Nonobstructive calculus in lower pole of the left kidney measures 1.7 mm. No hydronephrosis or hydroureter. 4. No small bowel obstruction. No pericecal inflammation. The patient is status post appendectomy. 5. There is diffuse liquid stool and some colonic gas throughout the colon. Findings are highly suspicious for diffuse diarrhea. Subtle mild thickening of colonic wall and enhancing of colonic wall in distal sigmoid colon axial image 79. Findings are highly suspicious for distal segmental colitis. No pericolonic inflammation. Probable colonic spasm rather than colonic stricture in mid sigmoid colon in right lower quadrant. Follow-up colonoscopy in 6-8 weeks is recommended. No definite evidence of obstructing colonic mass. 6. Mild degenerative changes thoracolumbar spine. Disc space flattening with vacuum disc phenomenon at L5-S1 level. 7. No ascites or free abdominal  air. Electronically Signed  By: Lahoma Crocker M.D.   On: 01/08/2017 15:21    Tye Savoy, NP-C @  01/09/2017, 1:19 PM  Pager number [phone]

## 2017-01-10 DIAGNOSIS — A084 Viral intestinal infection, unspecified: Secondary | ICD-10-CM | POA: Diagnosis not present

## 2017-01-10 DIAGNOSIS — I10 Essential (primary) hypertension: Secondary | ICD-10-CM | POA: Diagnosis not present

## 2017-01-10 DIAGNOSIS — E876 Hypokalemia: Secondary | ICD-10-CM | POA: Diagnosis not present

## 2017-01-10 DIAGNOSIS — E860 Dehydration: Secondary | ICD-10-CM | POA: Diagnosis not present

## 2017-01-10 DIAGNOSIS — K529 Noninfective gastroenteritis and colitis, unspecified: Secondary | ICD-10-CM | POA: Diagnosis not present

## 2017-01-10 LAB — BASIC METABOLIC PANEL
Anion gap: 3 — ABNORMAL LOW (ref 5–15)
BUN: <5 mg/dL — ABNORMAL LOW (ref 6–20)
CHLORIDE: 112 mmol/L — AB (ref 101–111)
CO2: 27 mmol/L (ref 22–32)
CREATININE: 0.67 mg/dL (ref 0.44–1.00)
Calcium: 8.2 mg/dL — ABNORMAL LOW (ref 8.9–10.3)
GFR calc non Af Amer: >60 mL/min (ref >60)
GLUCOSE: 84 mg/dL (ref 65–99)
Potassium: 3.1 mmol/L — ABNORMAL LOW (ref 3.5–5.1)
Sodium: 142 mmol/L (ref 135–145)

## 2017-01-10 LAB — GASTROINTESTINAL PANEL BY PCR, STOOL (REPLACES STOOL CULTURE)
ADENOVIRUS F40/41: NOT DETECTED
Astrovirus: DETECTED — AB
CRYPTOSPORIDIUM: NOT DETECTED
CYCLOSPORA CAYETANENSIS: NOT DETECTED
Campylobacter species: NOT DETECTED
ENTAMOEBA HISTOLYTICA: NOT DETECTED
ENTEROPATHOGENIC E COLI (EPEC): NOT DETECTED
Enteroaggregative E coli (EAEC): NOT DETECTED
Enterotoxigenic E coli (ETEC): NOT DETECTED
GIARDIA LAMBLIA: NOT DETECTED
Norovirus GI/GII: NOT DETECTED
Plesimonas shigelloides: NOT DETECTED
Rotavirus A: NOT DETECTED
SALMONELLA SPECIES: NOT DETECTED
SHIGELLA/ENTEROINVASIVE E COLI (EIEC): NOT DETECTED
Sapovirus (I, II, IV, and V): NOT DETECTED
Shiga like toxin producing E coli (STEC): NOT DETECTED
VIBRIO CHOLERAE: NOT DETECTED
VIBRIO SPECIES: NOT DETECTED
YERSINIA ENTEROCOLITICA: NOT DETECTED

## 2017-01-10 LAB — HIV ANTIBODY (ROUTINE TESTING W REFLEX): HIV SCREEN 4TH GENERATION: NONREACTIVE

## 2017-01-10 MED ORDER — POTASSIUM CHLORIDE 10 MEQ/100ML IV SOLN
10.0000 meq | INTRAVENOUS | Status: AC
  Administered 2017-01-10 (×2): 10 meq via INTRAVENOUS
  Filled 2017-01-10 (×3): qty 100

## 2017-01-10 MED ORDER — CEFTRIAXONE SODIUM 1 G IJ SOLR
1.0000 g | INTRAMUSCULAR | Status: DC
  Administered 2017-01-10: 1 g via INTRAVENOUS
  Filled 2017-01-10 (×2): qty 10

## 2017-01-10 MED ORDER — SODIUM CHLORIDE 0.9 % IV SOLN: 30.0000 meq | Freq: Once | INTRAVENOUS | Status: DC

## 2017-01-10 MED ORDER — POTASSIUM CHLORIDE CRYS ER 10 MEQ PO TBCR
10.0000 meq | EXTENDED_RELEASE_TABLET | Freq: Once | ORAL | Status: AC
  Administered 2017-01-10: 10 meq via ORAL
  Filled 2017-01-10: qty 1

## 2017-01-10 NOTE — Progress Notes (Signed)
[facility] Progress Note  Chief Complaint:   diarrhea  Subjective: only one episode of diarrhea today. Nausea much better. No cramps  Objective:  Vital signs in last 24 hours: Temp:  [98.2 F (36.8 C)-98.5 F (36.9 C)] 98.5 F (36.9 C) (03/01 0600) Pulse Rate:  [61-67] 61 (03/01 0600) Resp:  [16-20] 20 (03/01 0600) BP: (117-148)/(64-77) 122/75 (03/01 0600) SpO2:  [99 %] 99 % (03/01 0600) Last BM Date: 01/09/17 General:   Alert, well-developed,  White female in NAD EENT:  Normal hearing, non icteric sclera, conjunctive pink.  Heart:  Regular rate and rhythm; no murmurs, no lower extremity edema Pulm: Normal respiratory effort, lungs CTA bilaterally without wheezes or crackles. Abdomen:  Soft, nondistended, nontender.  Normal bowel sounds, no masses felt. No hepatomegaly.    Neurologic:  Alert and  oriented x4;  grossly normal neurologically. Psych:  Alert and cooperative. Normal mood and affect.  Intake/Output from previous day: 02/28 0701 - 03/01 0700 In: 1721.3 [P.O.:110; I.V.:1211.3; IV Piggyback:400] Out: -  Intake/Output this shift: Total I/O In: 120 [P.O.:120] Out: -   Lab Results:  Recent Labs  01/08/17 1218 01/09/17 1215  WBC 10.2 5.7  HGB 17.1* 13.2  HCT 48.0* 37.5  PLT 345 245   BMET  Recent Labs  01/08/17 1218 01/08/17 2219 01/09/17 1215 01/10/17 0554  NA 136  --  140 142  K 2.7*  --  2.9* 3.1*  CL 104  --  113* 112*  CO2 24  --  24 27  GLUCOSE 121*  --  91 84  BUN 14  --  5* <5*  CREATININE 1.06* 0.73 0.68 0.67  CALCIUM 8.9  --  7.9* 8.2*   LFT  Recent Labs  01/08/17 1218  PROT 8.1  ALBUMIN 4.4  AST 26  ALT 29  ALKPHOS 88  BILITOT 0.8   PT/INR No results for input(s): LABPROT, INR in the last 72 hours. Hepatitis Panel No results for input(s): HEPBSAG, HCVAB, HEPAIGM, HEPBIGM in the last 72 hours.  Dg Chest 2 View  Result Date: 01/08/2017 CLINICAL DATA:  Nausea, vomiting, shortness of breath, body aches  and fever EXAM: CHEST  2 VIEW COMPARISON:  Chest x-ray of 10/24/2016 FINDINGS: Linear scarring is noted in the right middle lobe anteriorly. No active infiltrate or effusion is seen. Mediastinal and hilar contours are unremarkable. The heart is within upper limits of normal. There are mild degenerative changes in the mid to lower thoracic spine. IMPRESSION: No active cardiopulmonary disease. Electronically Signed   By: Ivar Drape M.D.   On: 01/08/2017 14:08   Ct Abdomen Pelvis W Contrast  Result Date: 01/08/2017 CLINICAL DATA:  Left side abdominal pain, vomiting, diarrhea, nausea dizziness for 3 days EXAM: CT ABDOMEN AND PELVIS WITH CONTRAST TECHNIQUE: Multidetector CT imaging of the abdomen and pelvis was performed using the standard protocol following bolus administration of intravenous contrast. CONTRAST:  161mL ISOVUE-300 IOPAMIDOL (ISOVUE-300) INJECTION 61% COMPARISON:  08/30/2011 FINDINGS: Lower chest: Lung bases shows stable 5 mm nodule in right middle lobe probable benign in nature. No acute findings are noted lung bases. Hepatobiliary: There is fatty infiltration of the liver. No focal hepatic mass. No calcified gallstones are noted within gallbladder. Pancreas: Unremarkable. No pancreatic ductal dilatation or surrounding inflammatory changes. Spleen: Normal in size without focal abnormality. Adrenals/Urinary Tract: No adrenal gland mass. Enhanced kidneys are symmetrical in size. No hydronephrosis or hydroureter. Stable left parapelvic renal cysts. Stable small right parapelvic renal cysts. There is  a cortical cyst in upper pole of the right kidney anteriorly measures 7 mm. Delayed renal images shows bilateral renal symmetrical excretion. Bilateral visualized proximal ureter is unremarkable. The urinary bladder is unremarkable. Bilateral distal ureter is unremarkable. No calcified calculi are noted within urinary bladder. Nonobstructive calcified calculus in lower pole of the left kidney measures 1.7  mm. Stomach/Bowel: There is no gastric outlet obstruction. No small bowel obstruction. No transition point in caliber of small bowel. The terminal ileum is unremarkable. The patient is status post appendectomy. There is liquid stool and gas in right colon and transverse colon. Findings are highly suspicious for diarrhea. Some gas noted descending colon. Liquid stool noted within sigmoid colon. There is subtle enhancement of the sigmoid colon wall. Mild thickening of distal sigmoid colon wall and rectum please see axial image 79. Findings are highly suspicious for distal segmental colitis. Mild focal narrowing of the lumen in mid sigmoid colon in right lower quadrant axial image 74 most likely due to colonic spasm. Please see coronal image 37. No definite evidence of mass. Follow-up colonoscopy in 4-6 weeks is recommended to exclude a colonic stricture. Vascular/Lymphatic: No aortic aneurysm. Mild atherosclerotic calcifications of abdominal aorta and iliac arteries. No adenopathy. No retroperitoneal adenopathy. Reproductive: The patient is status post hysterectomy. Other: No ascites or free abdominal air. Musculoskeletal: No destructive bony lesions are noted. Sagittal images of the spine shows mild degenerative changes lower thoracic spine. There is significant disc space flattening with vacuum disc phenomenon and mild posterior spurring at L5-S1 level. IMPRESSION: 1. There is fatty infiltration of the liver.  No focal hepatic mass. 2. Stable 5 mm lung nodule in right middle lobe probable benign in nature. 3. Stable bilateral renal parapelvic cysts left greater than right. Nonobstructive calculus in lower pole of the left kidney measures 1.7 mm. No hydronephrosis or hydroureter. 4. No small bowel obstruction. No pericecal inflammation. The patient is status post appendectomy. 5. There is diffuse liquid stool and some colonic gas throughout the colon. Findings are highly suspicious for diffuse diarrhea. Subtle mild  thickening of colonic wall and enhancing of colonic wall in distal sigmoid colon axial image 79. Findings are highly suspicious for distal segmental colitis. No pericolonic inflammation. Probable colonic spasm rather than colonic stricture in mid sigmoid colon in right lower quadrant. Follow-up colonoscopy in 6-8 weeks is recommended. No definite evidence of obstructing colonic mass. 6. Mild degenerative changes thoracolumbar spine. Disc space flattening with vacuum disc phenomenon at L5-S1 level. 7. No ascites or free abdominal air. Electronically Signed   By: Lahoma Crocker M.D.   On: 01/08/2017 15:21     Assessment / Plan:  61 yo old female with acute nausea, vomiting and crampy diarrhea, suspect infectious. Mild segmental descending colitis on CTscan.  Stools studies still pending. Her diarrhea is resolving, possibly combination of Merrem, Levsin and time. Still considering inpatient colonoscopy so keep on clears until stool studies result.    ADDENDUM: GI pathogen panel positive for Astrovirus. No need for antibiotics, supportive care indicated. Can advance diet, would go with low fiber for next several days. If tolerates solids then should be able to go home in am. We will see her in the office to further discuss colon cancer screening.   Active Problems:   Hypothyroidism   Hypertension   Colitis   Hypokalemia   Fatty liver   Dehydration   Intractable vomiting with nausea   LOS: 2 days   Maia Breslow   01/10/2017, 1:29 PM  Pager number [phone]

## 2017-01-10 NOTE — Progress Notes (Signed)
PROGRESS NOTE  Yannely Ocejo Kenna [id number] DOB: Mar 24, 1956 DOA: 01/08/2017 PCP: Nyoka Cowden, MD  Brief History:  61 year old female with a history of hypothyroidism, hypertension presented with new onset diarrhea that began on 01/05/2017 associated with abdominal cramping. She feels that this may have been related to eating some expired soup. She has not been on any antibiotics. She stated she had 6-7 loose bowel movements daily prior to admission and continues to have watery stools after admission. The patient also complained of nausea and vomiting although she states that her emesis has improved. She had a low-grade temperature of 99.44F at home. Because of decreased oral intake and increased generalized weakness with persistent symptoms she saw her primary care provider on 01/08/2017. She was since, sent to the emergency department for further evaluation. She denies any hematochezia, melena, hematemesis, chest pain, short of breath, dysuria, hematuria. She states that she has not had endoscopy or colonoscopy in over 10 years. CT of the abdomen and pelvis showed liquid stool in the right and transverse colon with distal sigmoid and rectal wall thickening. There was also focal narrowing in the mid sigmoid colon. The patient was started on IV fluids and intravenous meropenem. Previous medical records reviewed and summarized.  Assessment/Plan: Diarrhea/segmental colitis -Stool pathogen panel--POSITIVE for astrovirus -likely infectious -Advance to soft diet -discussed with GI, Tye Savoy -d/c merrem -continue hyoscyamine  Hypothyroidism is no -Continue Synthroid  Dehydration/Intractable vomiting -The patient was hemoconcentrated with elevated serum creatinine at the time of admission -Baseline creatinine 0.6-0.8 -Presenting creatinine 1.06 -continue IVF, add KCl to fluid -am BMP  Hypokalemia -Replete -Check magnesium--1.8  UTI -d/c merrem -UA with TNTC  WBC -start ceftriaxone -no urine culture performed at time of admission  Hypertension -restart atenolol   Disposition Plan:   Home 01/11/17 if stable and tolerates diet Family Communication:  spouse updated at bedside  Consultants:  [facility]  Code Status:  FULL  DVT Prophylaxis:  [place] Lovenox   Procedures: As Listed in Progress Note Above  Antibiotics: Merrem   Subjective: Patient states that her number of stools and the volume of the stools are improving, but she is still having watery stool. She feels that her vomiting has improved. She is tolerating clear liquids. Denies fevers, chills, chest pain, abdominal pain, hematochezia, melena, dysuria, hematuria. No rashes.  Objective: Vitals:   01/09/17 0513 01/09/17 1640 01/09/17 2044 01/10/17 0600  BP: (!) 150/73 (!) 148/77 117/64 122/75  Pulse: 76 67 64 61  Resp: 16 16 16 20   Temp: 98.7 F (37.1 C) 98.2 F (36.8 C) 98.4 F (36.9 C) 98.5 F (36.9 C)  TempSrc: Oral Oral Oral Oral  SpO2: 99% 99% 99% 99%  Weight:      Height:        Intake/Output Summary (Last 24 hours) at 01/10/17 1501 Last data filed at 01/10/17 0952  Gross per 24 hour  Intake          1831.25 ml  Output                0 ml  Net          1831.25 ml   Weight change:  Exam:   General:  Pt is alert, follows commands appropriately, not in acute distress  HEENT: No icterus, No thrush, No neck mass, [place]/AT  Cardiovascular: RRR, S1/S2, no rubs, no gallops  Respiratory: CTA bilaterally, no wheezing, no crackles, no rhonchi  Abdomen: Soft/+BS, non  tender, non distended, no guarding  Extremities: No edema, No lymphangitis, No petechiae, No rashes, no synovitis   Data Reviewed: I have personally reviewed following labs and imaging studies Basic Metabolic Panel:  Recent Labs Lab 01/08/17 1218 01/08/17 2219 01/09/17 1215 01/10/17 0554  NA 136  --  140 142  K 2.7*  --  2.9* 3.1*  CL 104  --  113* 112*  CO2 24  --  24 27  GLUCOSE  121*  --  91 84  BUN 14  --  5* <5*  CREATININE 1.06* 0.73 0.68 0.67  CALCIUM 8.9  --  7.9* 8.2*  MG  --   --  1.8  --    Liver Function Tests:  Recent Labs Lab 01/08/17 1218  AST 26  ALT 29  ALKPHOS 88  BILITOT 0.8  PROT 8.1  ALBUMIN 4.4    Recent Labs Lab 01/08/17 1218  LIPASE 28   No results for input(s): AMMONIA in the last 168 hours. Coagulation Profile: No results for input(s): INR, PROTIME in the last 168 hours. CBC:  Recent Labs Lab 01/08/17 1218 01/09/17 1215  WBC 10.2 5.7  HGB 17.1* 13.2  HCT 48.0* 37.5  MCV 81.6 83.1  PLT 345 245   Cardiac Enzymes: No results for input(s): CKTOTAL, CKMB, CKMBINDEX, TROPONINI in the last 168 hours. BNP: Invalid input(s): POCBNP CBG:  Recent Labs Lab 01/08/17 1343  GLUCAP 118*   HbA1C: No results for input(s): HGBA1C in the last 72 hours. Urine analysis:    Component Value Date/Time   COLORURINE YELLOW 01/08/2017 1556   APPEARANCEUR HAZY (A) 01/08/2017 1556   LABSPEC 1.029 01/08/2017 1556   PHURINE 6.0 01/08/2017 1556   GLUCOSEU NEGATIVE 01/08/2017 1556   HGBUR MODERATE (A) 01/08/2017 1556   BILIRUBINUR NEGATIVE 01/08/2017 1556   KETONESUR NEGATIVE 01/08/2017 1556   PROTEINUR NEGATIVE 01/08/2017 1556   UROBILINOGEN 0.2 08/30/2011 2014   NITRITE NEGATIVE 01/08/2017 1556   LEUKOCYTESUR LARGE (A) 01/08/2017 1556   Sepsis Labs: @LABRCNTIP (procalcitonin:4,lacticidven:4) ) Recent Results (from the past 240 hour(s))  Gastrointestinal Panel by PCR , Stool     Status: Abnormal   Collection Time: 01/09/17 12:48 PM  Result Value Ref Range Status   Campylobacter species NOT DETECTED NOT DETECTED Final   Plesimonas shigelloides NOT DETECTED NOT DETECTED Final   Salmonella species NOT DETECTED NOT DETECTED Final   Yersinia enterocolitica NOT DETECTED NOT DETECTED Final   Vibrio species NOT DETECTED NOT DETECTED Final   Vibrio cholerae NOT DETECTED NOT DETECTED Final   Enteroaggregative E coli (EAEC) NOT  DETECTED NOT DETECTED Final   Enteropathogenic E coli (EPEC) NOT DETECTED NOT DETECTED Final   Enterotoxigenic E coli (ETEC) NOT DETECTED NOT DETECTED Final   Shiga like toxin producing E coli (STEC) NOT DETECTED NOT DETECTED Final   Shigella/Enteroinvasive E coli (EIEC) NOT DETECTED NOT DETECTED Final   Cryptosporidium NOT DETECTED NOT DETECTED Final   Cyclospora cayetanensis NOT DETECTED NOT DETECTED Final   Entamoeba histolytica NOT DETECTED NOT DETECTED Final   Giardia lamblia NOT DETECTED NOT DETECTED Final   Adenovirus F40/41 NOT DETECTED NOT DETECTED Final   Astrovirus DETECTED (A) NOT DETECTED Final   Norovirus GI/GII NOT DETECTED NOT DETECTED Final   Rotavirus A NOT DETECTED NOT DETECTED Final   Sapovirus (I, II, IV, and V) NOT DETECTED NOT DETECTED Final     Scheduled Meds: . atenolol  25 mg Oral Daily  . clotrimazole   Topical BID  . enoxaparin (LOVENOX)  injection  40 mg Subcutaneous Q24H  . hyoscyamine  0.25 mg Sublingual QID  . levothyroxine  125 mcg Oral QAC breakfast  . meropenem (MERREM) IV  1 g Intravenous Q8H  . vitamin B-12  1,000 mcg Oral Daily   Continuous Infusions: . sodium chloride 0.9 % 1,000 mL with potassium chloride 40 mEq infusion 75 mL/hr at 01/10/17 1242    Procedures/Studies: Dg Chest 2 View  Result Date: 01/08/2017 CLINICAL DATA:  Nausea, vomiting, shortness of breath, body aches and fever EXAM: CHEST  2 VIEW COMPARISON:  Chest x-ray of 10/24/2016 FINDINGS: Linear scarring is noted in the right middle lobe anteriorly. No active infiltrate or effusion is seen. Mediastinal and hilar contours are unremarkable. The heart is within upper limits of normal. There are mild degenerative changes in the mid to lower thoracic spine. IMPRESSION: No active cardiopulmonary disease. Electronically Signed   By: Ivar Drape M.D.   On: 01/08/2017 14:08   Ct Abdomen Pelvis W Contrast  Result Date: 01/08/2017 CLINICAL DATA:  Left side abdominal pain, vomiting,  diarrhea, nausea dizziness for 3 days EXAM: CT ABDOMEN AND PELVIS WITH CONTRAST TECHNIQUE: Multidetector CT imaging of the abdomen and pelvis was performed using the standard protocol following bolus administration of intravenous contrast. CONTRAST:  181mL ISOVUE-300 IOPAMIDOL (ISOVUE-300) INJECTION 61% COMPARISON:  08/30/2011 FINDINGS: Lower chest: Lung bases shows stable 5 mm nodule in right middle lobe probable benign in nature. No acute findings are noted lung bases. Hepatobiliary: There is fatty infiltration of the liver. No focal hepatic mass. No calcified gallstones are noted within gallbladder. Pancreas: Unremarkable. No pancreatic ductal dilatation or surrounding inflammatory changes. Spleen: Normal in size without focal abnormality. Adrenals/Urinary Tract: No adrenal gland mass. Enhanced kidneys are symmetrical in size. No hydronephrosis or hydroureter. Stable left parapelvic renal cysts. Stable small right parapelvic renal cysts. There is a cortical cyst in upper pole of the right kidney anteriorly measures 7 mm. Delayed renal images shows bilateral renal symmetrical excretion. Bilateral visualized proximal ureter is unremarkable. The urinary bladder is unremarkable. Bilateral distal ureter is unremarkable. No calcified calculi are noted within urinary bladder. Nonobstructive calcified calculus in lower pole of the left kidney measures 1.7 mm. Stomach/Bowel: There is no gastric outlet obstruction. No small bowel obstruction. No transition point in caliber of small bowel. The terminal ileum is unremarkable. The patient is status post appendectomy. There is liquid stool and gas in right colon and transverse colon. Findings are highly suspicious for diarrhea. Some gas noted descending colon. Liquid stool noted within sigmoid colon. There is subtle enhancement of the sigmoid colon wall. Mild thickening of distal sigmoid colon wall and rectum please see axial image 79. Findings are highly suspicious for distal  segmental colitis. Mild focal narrowing of the lumen in mid sigmoid colon in right lower quadrant axial image 74 most likely due to colonic spasm. Please see coronal image 37. No definite evidence of mass. Follow-up colonoscopy in 4-6 weeks is recommended to exclude a colonic stricture. Vascular/Lymphatic: No aortic aneurysm. Mild atherosclerotic calcifications of abdominal aorta and iliac arteries. No adenopathy. No retroperitoneal adenopathy. Reproductive: The patient is status post hysterectomy. Other: No ascites or free abdominal air. Musculoskeletal: No destructive bony lesions are noted. Sagittal images of the spine shows mild degenerative changes lower thoracic spine. There is significant disc space flattening with vacuum disc phenomenon and mild posterior spurring at L5-S1 level. IMPRESSION: 1. There is fatty infiltration of the liver.  No focal hepatic mass. 2. Stable 5 mm lung nodule  in right middle lobe probable benign in nature. 3. Stable bilateral renal parapelvic cysts left greater than right. Nonobstructive calculus in lower pole of the left kidney measures 1.7 mm. No hydronephrosis or hydroureter. 4. No small bowel obstruction. No pericecal inflammation. The patient is status post appendectomy. 5. There is diffuse liquid stool and some colonic gas throughout the colon. Findings are highly suspicious for diffuse diarrhea. Subtle mild thickening of colonic wall and enhancing of colonic wall in distal sigmoid colon axial image 79. Findings are highly suspicious for distal segmental colitis. No pericolonic inflammation. Probable colonic spasm rather than colonic stricture in mid sigmoid colon in right lower quadrant. Follow-up colonoscopy in 6-8 weeks is recommended. No definite evidence of obstructing colonic mass. 6. Mild degenerative changes thoracolumbar spine. Disc space flattening with vacuum disc phenomenon at L5-S1 level. 7. No ascites or free abdominal air. Electronically Signed   By: Lahoma Crocker  M.D.   On: 01/08/2017 15:21    Aiken Withem, DO  [facility] Pager [phone]  If 7PM-7AM, please contact night-coverage [email or web address] Password [email or web address] 01/10/2017, 3:01 PM   LOS: 2 days

## 2017-01-11 ENCOUNTER — Telehealth: Payer: Self-pay | Admitting: Nurse Practitioner

## 2017-01-11 DIAGNOSIS — E860 Dehydration: Secondary | ICD-10-CM | POA: Diagnosis not present

## 2017-01-11 DIAGNOSIS — E876 Hypokalemia: Secondary | ICD-10-CM | POA: Diagnosis not present

## 2017-01-11 DIAGNOSIS — K529 Noninfective gastroenteritis and colitis, unspecified: Secondary | ICD-10-CM | POA: Diagnosis not present

## 2017-01-11 DIAGNOSIS — R112 Nausea with vomiting, unspecified: Secondary | ICD-10-CM | POA: Diagnosis not present

## 2017-01-11 LAB — BASIC METABOLIC PANEL
ANION GAP: 3 — AB (ref 5–15)
BUN: <5 mg/dL — ABNORMAL LOW (ref 6–20)
CHLORIDE: 111 mmol/L (ref 101–111)
CO2: 28 mmol/L (ref 22–32)
Calcium: 8.5 mg/dL — ABNORMAL LOW (ref 8.9–10.3)
Creatinine, Ser: 0.66 mg/dL (ref 0.44–1.00)
GFR calc Af Amer: >60 mL/min (ref >60)
GFR calc non Af Amer: >60 mL/min (ref >60)
Glucose, Bld: 113 mg/dL — ABNORMAL HIGH (ref 65–99)
Potassium: 3.5 mmol/L (ref 3.5–5.1)
Sodium: 142 mmol/L (ref 135–145)

## 2017-01-11 LAB — MAGNESIUM: Magnesium: 1.7 mg/dL (ref 1.7–2.4)

## 2017-01-11 LAB — C DIFFICILE QUICK SCREEN W PCR REFLEX
C Diff antigen: NEGATIVE
C Diff interpretation: NOT DETECTED
C Diff toxin: NEGATIVE

## 2017-01-11 MED ORDER — CEFUROXIME AXETIL 500 MG PO TABS: 500.0000 mg | ORAL_TABLET | Freq: Two times a day (BID) | ORAL | 0 refills | Status: DC

## 2017-01-11 MED ORDER — HYOSCYAMINE SULFATE 0.125 MG SL SUBL: 0.2500 mg | SUBLINGUAL_TABLET | Freq: Four times a day (QID) | SUBLINGUAL | 0 refills | Status: DC

## 2017-01-11 MED ORDER — PROMETHAZINE HCL 25 MG PO TABS: 25.0000 mg | ORAL_TABLET | Freq: Four times a day (QID) | ORAL | 0 refills | Status: DC | PRN

## 2017-01-11 NOTE — Progress Notes (Signed)
DC instructions & new and pta medications reviewed with patient. Patient demonstrated understanding. Instructions sent with patient. IV removed. Patient escorted to ride with husband via wheelchair.

## 2017-01-11 NOTE — Telephone Encounter (Signed)
Left message for patient to return my call. Per Nevin Bloodgood, patient needs to follow up in Office in a couple of weeks. Hospital follow up.

## 2017-01-11 NOTE — Discharge Summary (Addendum)
Physician Discharge Summary  Loyda Zahner [place] [id number] DOB: 1956-01-27 DOA: 01/08/2017  PCP: Nyoka Cowden, MD  Admit date: 01/08/2017 Discharge date: 01/11/2017  Admitted From:  Home Disposition:   Home  Recommendations for Outpatient Follow-up:  1. Follow up with PCP in 1-2 weeks 2. Please obtain BMP/CBC in one week   Discharge Condition: Stable CODE STATUS: FULL Diet recommendation: Heart Healthy  Brief/Interim Summary: 61 year old female with a history of hypothyroidism, hypertension presented with new onset diarrhea that began on 01/05/2017 associated with abdominal cramping. She feels that this may have been related to eating some expired soup. She has not been on any antibiotics. She stated she had 6-7 loose bowel movements daily prior to admission and continues to have watery stools after admission. The patient also complained of nausea and vomiting although she states that her emesis has improved. She had a low-grade temperature of 99.12F at home. Because of decreased oral intake and increased generalized weakness with persistent symptoms she saw her primary care provider on 01/08/2017. She was since, sent to the emergency department for further evaluation. She denies any hematochezia, melena, hematemesis, chest pain, short of breath, dysuria, hematuria. She states that she has not had endoscopy or colonoscopy in over 10 years. CT of the abdomen and pelvis showed liquid stool in the right and transverse colon with distal sigmoid and rectal wall thickening. There was also focal narrowing in the mid sigmoid colon. The patient was started on IV fluids and intravenous meropenem. Because the patient had recurrent intractable vomiting with inability to tolerate oral medications, the patient was admitted for IV fluids and intravenous antibiotics for her UTI. Previous medical records reviewed and summarized.  Discharge Diagnoses:  Diarrhea/segmental colitis--viral  gastroenteritis -Stool pathogen panel--POSITIVE for astrovirus -likely infectious -Advance to soft diet which pt tolerated -discussed with GI--no role for colonoscopy in acute infectious colitis--cleared for d/c with outpt surveillance colonoscopy -d/c merrem -continue hyoscyamine  Hypothyroidism -Continue Synthroid  Dehydration/Intractable vomiting -The patient was hemoconcentrated with elevated serum creatinine at the time of admission -Because the patient had recurrent vomiting with dehydration and inability to tolerate her oral medications the patient was admitted for intravenous fluids and IV antibiotics -Baseline creatinine 0.6-0.8 -Presenting creatinine 1.06 -continue IVF, add KCl to fluid -am BMP  Hypokalemia -Replete -Check magnesium--1.8  UTI -d/c merrem -UA with TNTC WBC -start ceftriaxone which pt tolerated without difficulty -no urine culture performed at time of admission -home with cefuroxime x 3 more days to complete 5 days of tx  Hypertension -restart atenolol   Discharge Instructions  Discharge Instructions    Diet - low sodium heart healthy    Complete by:  As directed    Increase activity slowly    Complete by:  As directed      Allergies as of 01/11/2017      Reactions   Adhesive [tape] Swelling, Other (See Comments)   Reaction:  Localized swelling    Ciprofloxacin Hives, Shortness Of Breath   Dimethicone Hives, Shortness Of Breath   Erythromycin Hives, Shortness Of Breath   Latex Hives, Shortness Of Breath   Other Hives, Shortness Of Breath, Other (See Comments)   Pt is allergic to nitrile gloves.    Penicillins Hives, Shortness Of Breath, Other (See Comments)   Has patient had a PCN reaction causing immediate rash, facial/tongue/throat swelling, SOB or lightheadedness with hypotension: Yes Has patient had a PCN reaction causing severe rash involving mucus membranes or skin necrosis: No Has patient had a PCN reaction that  required  hospitalization No Has patient had a PCN reaction occurring within the last 10 years: No If all of the above answers are "NO", then may proceed with Cephalosporin use.   Red Dye Hives, Shortness Of Breath   Shellfish Allergy Hives, Shortness Of Breath   Sulfonamide Derivatives Hives, Shortness Of Breath   Alcohol-sulfur [sulfur] Swelling, Other (See Comments)   Reaction:  Localized swelling    Aspirin Nausea And Vomiting   Doxycycline Nausea And Vomiting      Medication List    TAKE these medications   albuterol 108 (90 Base) MCG/ACT inhaler Commonly known as:  PROVENTIL HFA;VENTOLIN HFA Inhale 2 puffs into the lungs every 6 (six) hours as needed for wheezing or shortness of breath.   atenolol 25 MG tablet Commonly known as:  TENORMIN TAKE 1 TABLET (25 MG TOTAL) BY MOUTH EVERY MORNING. FOR MIGRAINES   cholecalciferol 1000 units tablet Commonly known as:  VITAMIN D Take 1,000 Units by mouth daily.   hyoscyamine 0.125 MG SL tablet Commonly known as:  LEVSIN SL Place 2 tablets (0.25 mg total) under the tongue 4 (four) times daily.   phentermine 30 MG capsule TAKE 1 CAPSULE BY MOUTH DAILY What changed:  how much to take  how to take this  when to take this  additional instructions   promethazine 25 MG tablet Commonly known as:  PHENERGAN Take 1 tablet (25 mg total) by mouth every 6 (six) hours as needed for nausea or vomiting.   SYNTHROID 125 MCG tablet Generic drug:  levothyroxine TAKE 1 TABLET (125 MCG TOTAL) BY MOUTH DAILY.   Vitamin B-12 2500 MCG Subl Place 2,500 mcg under the tongue daily.       Allergies  Allergen Reactions  . Adhesive [Tape] Swelling and Other (See Comments)    Reaction:  Localized swelling   . Ciprofloxacin Hives and Shortness Of Breath  . Dimethicone Hives and Shortness Of Breath  . Erythromycin Hives and Shortness Of Breath  . Latex Hives and Shortness Of Breath  . Other Hives, Shortness Of Breath and Other (See Comments)    Pt  is allergic to nitrile gloves.   . Penicillins Hives, Shortness Of Breath and Other (See Comments)    Has patient had a PCN reaction causing immediate rash, facial/tongue/throat swelling, SOB or lightheadedness with hypotension: Yes Has patient had a PCN reaction causing severe rash involving mucus membranes or skin necrosis: No Has patient had a PCN reaction that required hospitalization No Has patient had a PCN reaction occurring within the last 10 years: No If all of the above answers are "NO", then may proceed with Cephalosporin use.  . Red Dye Hives and Shortness Of Breath  . Shellfish Allergy Hives and Shortness Of Breath  . Sulfonamide Derivatives Hives and Shortness Of Breath  . Alcohol-Sulfur [Sulfur] Swelling and Other (See Comments)    Reaction:  Localized swelling   . Aspirin Nausea And Vomiting  . Doxycycline Nausea And Vomiting    Consultations:  [facility]   Procedures/Studies: Dg Chest 2 View  Result Date: 01/08/2017 CLINICAL DATA:  Nausea, vomiting, shortness of breath, body aches and fever EXAM: CHEST  2 VIEW COMPARISON:  Chest x-ray of 10/24/2016 FINDINGS: Linear scarring is noted in the right middle lobe anteriorly. No active infiltrate or effusion is seen. Mediastinal and hilar contours are unremarkable. The heart is within upper limits of normal. There are mild degenerative changes in the mid to lower thoracic spine. IMPRESSION: No active cardiopulmonary disease.  Electronically Signed   By: Ivar Drape M.D.   On: 01/08/2017 14:08   Ct Abdomen Pelvis W Contrast  Result Date: 01/08/2017 CLINICAL DATA:  Left side abdominal pain, vomiting, diarrhea, nausea dizziness for 3 days EXAM: CT ABDOMEN AND PELVIS WITH CONTRAST TECHNIQUE: Multidetector CT imaging of the abdomen and pelvis was performed using the standard protocol following bolus administration of intravenous contrast. CONTRAST:  184mL ISOVUE-300 IOPAMIDOL (ISOVUE-300) INJECTION 61% COMPARISON:  08/30/2011  FINDINGS: Lower chest: Lung bases shows stable 5 mm nodule in right middle lobe probable benign in nature. No acute findings are noted lung bases. Hepatobiliary: There is fatty infiltration of the liver. No focal hepatic mass. No calcified gallstones are noted within gallbladder. Pancreas: Unremarkable. No pancreatic ductal dilatation or surrounding inflammatory changes. Spleen: Normal in size without focal abnormality. Adrenals/Urinary Tract: No adrenal gland mass. Enhanced kidneys are symmetrical in size. No hydronephrosis or hydroureter. Stable left parapelvic renal cysts. Stable small right parapelvic renal cysts. There is a cortical cyst in upper pole of the right kidney anteriorly measures 7 mm. Delayed renal images shows bilateral renal symmetrical excretion. Bilateral visualized proximal ureter is unremarkable. The urinary bladder is unremarkable. Bilateral distal ureter is unremarkable. No calcified calculi are noted within urinary bladder. Nonobstructive calcified calculus in lower pole of the left kidney measures 1.7 mm. Stomach/Bowel: There is no gastric outlet obstruction. No small bowel obstruction. No transition point in caliber of small bowel. The terminal ileum is unremarkable. The patient is status post appendectomy. There is liquid stool and gas in right colon and transverse colon. Findings are highly suspicious for diarrhea. Some gas noted descending colon. Liquid stool noted within sigmoid colon. There is subtle enhancement of the sigmoid colon wall. Mild thickening of distal sigmoid colon wall and rectum please see axial image 79. Findings are highly suspicious for distal segmental colitis. Mild focal narrowing of the lumen in mid sigmoid colon in right lower quadrant axial image 74 most likely due to colonic spasm. Please see coronal image 37. No definite evidence of mass. Follow-up colonoscopy in 4-6 weeks is recommended to exclude a colonic stricture. Vascular/Lymphatic: No aortic aneurysm.  Mild atherosclerotic calcifications of abdominal aorta and iliac arteries. No adenopathy. No retroperitoneal adenopathy. Reproductive: The patient is status post hysterectomy. Other: No ascites or free abdominal air. Musculoskeletal: No destructive bony lesions are noted. Sagittal images of the spine shows mild degenerative changes lower thoracic spine. There is significant disc space flattening with vacuum disc phenomenon and mild posterior spurring at L5-S1 level. IMPRESSION: 1. There is fatty infiltration of the liver.  No focal hepatic mass. 2. Stable 5 mm lung nodule in right middle lobe probable benign in nature. 3. Stable bilateral renal parapelvic cysts left greater than right. Nonobstructive calculus in lower pole of the left kidney measures 1.7 mm. No hydronephrosis or hydroureter. 4. No small bowel obstruction. No pericecal inflammation. The patient is status post appendectomy. 5. There is diffuse liquid stool and some colonic gas throughout the colon. Findings are highly suspicious for diffuse diarrhea. Subtle mild thickening of colonic wall and enhancing of colonic wall in distal sigmoid colon axial image 79. Findings are highly suspicious for distal segmental colitis. No pericolonic inflammation. Probable colonic spasm rather than colonic stricture in mid sigmoid colon in right lower quadrant. Follow-up colonoscopy in 6-8 weeks is recommended. No definite evidence of obstructing colonic mass. 6. Mild degenerative changes thoracolumbar spine. Disc space flattening with vacuum disc phenomenon at L5-S1 level. 7. No ascites or free abdominal  air. Electronically Signed   By: Lahoma Crocker M.D.   On: 01/08/2017 15:21        Discharge Exam: Vitals:   01/11/17 0556 01/11/17 1005  BP: 123/63 139/79  Pulse: 67 73  Resp: 18 16  Temp: 98.5 F (36.9 C) 98.4 F (36.9 C)   Vitals:   01/10/17 1751 01/10/17 2111 01/11/17 0556 01/11/17 1005  BP: 137/70 (!) 129/56 123/63 139/79  Pulse: 70 72 67 73   Resp: 18 18 18 16   Temp: 99.1 F (37.3 C) 98.6 F (37 C) 98.5 F (36.9 C) 98.4 F (36.9 C)  TempSrc: Oral Oral Oral Oral  SpO2: 100% 97% 97% 98%  Weight:      Height:        General: Pt is alert, awake, not in acute distress Cardiovascular: RRR, S1/S2 +, no rubs, no gallops Respiratory: CTA bilaterally, no wheezing, no rhonchi Abdominal: Soft, NT, ND, bowel sounds + Extremities: no edema, no cyanosis   The results of significant diagnostics from this hospitalization (including imaging, microbiology, ancillary and laboratory) are listed below for reference.    Significant Diagnostic Studies: Dg Chest 2 View  Result Date: 01/08/2017 CLINICAL DATA:  Nausea, vomiting, shortness of breath, body aches and fever EXAM: CHEST  2 VIEW COMPARISON:  Chest x-ray of 10/24/2016 FINDINGS: Linear scarring is noted in the right middle lobe anteriorly. No active infiltrate or effusion is seen. Mediastinal and hilar contours are unremarkable. The heart is within upper limits of normal. There are mild degenerative changes in the mid to lower thoracic spine. IMPRESSION: No active cardiopulmonary disease. Electronically Signed   By: Ivar Drape M.D.   On: 01/08/2017 14:08   Ct Abdomen Pelvis W Contrast  Result Date: 01/08/2017 CLINICAL DATA:  Left side abdominal pain, vomiting, diarrhea, nausea dizziness for 3 days EXAM: CT ABDOMEN AND PELVIS WITH CONTRAST TECHNIQUE: Multidetector CT imaging of the abdomen and pelvis was performed using the standard protocol following bolus administration of intravenous contrast. CONTRAST:  159mL ISOVUE-300 IOPAMIDOL (ISOVUE-300) INJECTION 61% COMPARISON:  08/30/2011 FINDINGS: Lower chest: Lung bases shows stable 5 mm nodule in right middle lobe probable benign in nature. No acute findings are noted lung bases. Hepatobiliary: There is fatty infiltration of the liver. No focal hepatic mass. No calcified gallstones are noted within gallbladder. Pancreas: Unremarkable. No  pancreatic ductal dilatation or surrounding inflammatory changes. Spleen: Normal in size without focal abnormality. Adrenals/Urinary Tract: No adrenal gland mass. Enhanced kidneys are symmetrical in size. No hydronephrosis or hydroureter. Stable left parapelvic renal cysts. Stable small right parapelvic renal cysts. There is a cortical cyst in upper pole of the right kidney anteriorly measures 7 mm. Delayed renal images shows bilateral renal symmetrical excretion. Bilateral visualized proximal ureter is unremarkable. The urinary bladder is unremarkable. Bilateral distal ureter is unremarkable. No calcified calculi are noted within urinary bladder. Nonobstructive calcified calculus in lower pole of the left kidney measures 1.7 mm. Stomach/Bowel: There is no gastric outlet obstruction. No small bowel obstruction. No transition point in caliber of small bowel. The terminal ileum is unremarkable. The patient is status post appendectomy. There is liquid stool and gas in right colon and transverse colon. Findings are highly suspicious for diarrhea. Some gas noted descending colon. Liquid stool noted within sigmoid colon. There is subtle enhancement of the sigmoid colon wall. Mild thickening of distal sigmoid colon wall and rectum please see axial image 79. Findings are highly suspicious for distal segmental colitis. Mild focal narrowing of the lumen in mid  sigmoid colon in right lower quadrant axial image 74 most likely due to colonic spasm. Please see coronal image 37. No definite evidence of mass. Follow-up colonoscopy in 4-6 weeks is recommended to exclude a colonic stricture. Vascular/Lymphatic: No aortic aneurysm. Mild atherosclerotic calcifications of abdominal aorta and iliac arteries. No adenopathy. No retroperitoneal adenopathy. Reproductive: The patient is status post hysterectomy. Other: No ascites or free abdominal air. Musculoskeletal: No destructive bony lesions are noted. Sagittal images of the spine shows  mild degenerative changes lower thoracic spine. There is significant disc space flattening with vacuum disc phenomenon and mild posterior spurring at L5-S1 level. IMPRESSION: 1. There is fatty infiltration of the liver.  No focal hepatic mass. 2. Stable 5 mm lung nodule in right middle lobe probable benign in nature. 3. Stable bilateral renal parapelvic cysts left greater than right. Nonobstructive calculus in lower pole of the left kidney measures 1.7 mm. No hydronephrosis or hydroureter. 4. No small bowel obstruction. No pericecal inflammation. The patient is status post appendectomy. 5. There is diffuse liquid stool and some colonic gas throughout the colon. Findings are highly suspicious for diffuse diarrhea. Subtle mild thickening of colonic wall and enhancing of colonic wall in distal sigmoid colon axial image 79. Findings are highly suspicious for distal segmental colitis. No pericolonic inflammation. Probable colonic spasm rather than colonic stricture in mid sigmoid colon in right lower quadrant. Follow-up colonoscopy in 6-8 weeks is recommended. No definite evidence of obstructing colonic mass. 6. Mild degenerative changes thoracolumbar spine. Disc space flattening with vacuum disc phenomenon at L5-S1 level. 7. No ascites or free abdominal air. Electronically Signed   By: Lahoma Crocker M.D.   On: 01/08/2017 15:21     Microbiology: Recent Results (from the past 240 hour(s))  Gastrointestinal Panel by PCR , Stool     Status: Abnormal   Collection Time: 01/09/17 12:48 PM  Result Value Ref Range Status   Campylobacter species NOT DETECTED NOT DETECTED Final   Plesimonas shigelloides NOT DETECTED NOT DETECTED Final   Salmonella species NOT DETECTED NOT DETECTED Final   Yersinia enterocolitica NOT DETECTED NOT DETECTED Final   Vibrio species NOT DETECTED NOT DETECTED Final   Vibrio cholerae NOT DETECTED NOT DETECTED Final   Enteroaggregative E coli (EAEC) NOT DETECTED NOT DETECTED Final    Enteropathogenic E coli (EPEC) NOT DETECTED NOT DETECTED Final   Enterotoxigenic E coli (ETEC) NOT DETECTED NOT DETECTED Final   Shiga like toxin producing E coli (STEC) NOT DETECTED NOT DETECTED Final   Shigella/Enteroinvasive E coli (EIEC) NOT DETECTED NOT DETECTED Final   Cryptosporidium NOT DETECTED NOT DETECTED Final   Cyclospora cayetanensis NOT DETECTED NOT DETECTED Final   Entamoeba histolytica NOT DETECTED NOT DETECTED Final   Giardia lamblia NOT DETECTED NOT DETECTED Final   Adenovirus F40/41 NOT DETECTED NOT DETECTED Final   Astrovirus DETECTED (A) NOT DETECTED Final   Norovirus GI/GII NOT DETECTED NOT DETECTED Final   Rotavirus A NOT DETECTED NOT DETECTED Final   Sapovirus (I, II, IV, and V) NOT DETECTED NOT DETECTED Final  C difficile quick scan w PCR reflex     Status: None   Collection Time: 01/10/17  2:17 AM  Result Value Ref Range Status   C Diff antigen NEGATIVE NEGATIVE Final   C Diff toxin NEGATIVE NEGATIVE Final   C Diff interpretation No C. difficile detected.  Final     Labs: Basic Metabolic Panel:  Recent Labs Lab 01/08/17 1218 01/08/17 2219 01/09/17 1215 01/10/17 [id number] 01/11/17 [id number]  NA 136  --  140 142 142  K 2.7*  --  2.9* 3.1* 3.5  CL 104  --  113* 112* 111  CO2 24  --  24 27 28   GLUCOSE 121*  --  91 84 113*  BUN 14  --  5* <5* <5*  CREATININE 1.06* 0.73 0.68 0.67 0.66  CALCIUM 8.9  --  7.9* 8.2* 8.5*  MG  --   --  1.8  --  1.7   Liver Function Tests:  Recent Labs Lab 01/08/17 1218  AST 26  ALT 29  ALKPHOS 88  BILITOT 0.8  PROT 8.1  ALBUMIN 4.4    Recent Labs Lab 01/08/17 1218  LIPASE 28   No results for input(s): AMMONIA in the last 168 hours. CBC:  Recent Labs Lab 01/08/17 1218 01/09/17 1215  WBC 10.2 5.7  HGB 17.1* 13.2  HCT 48.0* 37.5  MCV 81.6 83.1  PLT 345 245   Cardiac Enzymes: No results for input(s): CKTOTAL, CKMB, CKMBINDEX, TROPONINI in the last 168 hours. BNP: Invalid input(s): POCBNP CBG:  Recent  Labs Lab 01/08/17 1343  GLUCAP 118*    Time coordinating discharge:  Greater than 30 minutes  Signed:  Aino Heckert, DO [facility] Pager: [phone] 01/11/2017, 12:25 PM

## 2017-01-14 ENCOUNTER — Telehealth: Payer: Self-pay

## 2017-01-14 NOTE — Telephone Encounter (Signed)
Discontinue Ceftin No further antibiotics needed at this time Follow-up on Wednesday as scheduled or tomorrow if unimproved

## 2017-01-14 NOTE — Telephone Encounter (Signed)
D/C 01/10/17 To: home  Spoke with pt and she states that she is still very weak and tired. She reports that she has developed a slight rash and thinks it may be from [place]. She also has severe nausea and vomiting after taking abx. She also reports increasing size of her neck lymph nodes and some sore throat, which she also attributes to the Ceftin. She would like to know if Ceftin can be changed to something else. Advised pt not to take another dose as rash can be possible allergic reaction, pt is allergic to Cipro.   Dr Stark Bray ADVISE ON CEFTIN  Appt scheduled with Dr Raliegh Ip 01/16/17, pt aware.   Transition Care Management Follow-up Telephone Call  How have you been since you were released from the hospital? Tired, weak, slowly improving   Do you understand why you were in the hospital? yes   Do you understand the discharge instrcutions? yes  Items Reviewed:  Medications reviewed: yes  Allergies reviewed: yes  Dietary changes reviewed: yes  Referrals reviewed: yes   Functional Questionnaire:   Activities of Daily Living (ADLs):   She states they are independent in the following: ambulation, bathing and hygiene, feeding, continence, grooming, toileting and dressing States they require assistance with the following: none   Any transportation issues/concerns?: no   Any patient concerns? Yes, above   Confirmed importance and date/time of follow-up visits scheduled: yes   Confirmed with patient if condition begins to worsen call PCP or go to the ER.  Patient was given the Call-a-Nurse line [phone]: yes

## 2017-01-15 ENCOUNTER — Other Ambulatory Visit: Payer: Self-pay

## 2017-01-15 NOTE — Telephone Encounter (Signed)
Spoke with pt and advised. Also advised her to keep appt tomorrow with Dr Raliegh Ip. Nothing further needed at this time.

## 2017-01-16 ENCOUNTER — Ambulatory Visit (INDEPENDENT_AMBULATORY_CARE_PROVIDER_SITE_OTHER): Payer: 59 | Admitting: Internal Medicine

## 2017-01-16 ENCOUNTER — Encounter: Payer: Self-pay | Admitting: Internal Medicine

## 2017-01-16 VITALS — BP 128/8 | HR 77 | Temp 98.4°F | Ht 67.0 in | Wt 174.4 lb

## 2017-01-16 DIAGNOSIS — E876 Hypokalemia: Secondary | ICD-10-CM | POA: Diagnosis not present

## 2017-01-16 DIAGNOSIS — A084 Viral intestinal infection, unspecified: Secondary | ICD-10-CM | POA: Diagnosis not present

## 2017-01-16 DIAGNOSIS — E860 Dehydration: Secondary | ICD-10-CM | POA: Diagnosis not present

## 2017-01-16 LAB — BASIC METABOLIC PANEL
BUN: 10 mg/dL (ref 6–23)
CHLORIDE: 101 meq/L (ref 96–112)
CO2: 33 mEq/L — ABNORMAL HIGH (ref 19–32)
Calcium: 9.7 mg/dL (ref 8.4–10.5)
Creatinine, Ser: 0.7 mg/dL (ref 0.40–1.20)
GFR: 90.59 mL/min (ref >60.00)
Glucose, Bld: 91 mg/dL (ref 70–99)
Potassium: 4.1 mEq/L (ref 3.5–5.1)
Sodium: 140 mEq/L (ref 135–145)

## 2017-01-16 LAB — CBC WITH DIFFERENTIAL/PLATELET
BASOS ABS: 0.1 10*3/uL (ref 0.0–0.1)
Basophils Relative: 0.8 % (ref 0.0–3.0)
EOS ABS: 0.2 10*3/uL (ref 0.0–0.7)
Eosinophils Relative: 2.5 % (ref 0.0–5.0)
HEMATOCRIT: 41.9 % (ref 36.0–46.0)
Hemoglobin: 14.1 g/dL (ref 12.0–15.0)
LYMPHS ABS: 2.4 10*3/uL (ref 0.7–4.0)
Lymphocytes Relative: 33.4 % (ref 12.0–46.0)
MCHC: 33.6 g/dL (ref 30.0–36.0)
MCV: 85.2 fl (ref 78.0–100.0)
MONOS PCT: 7.9 % (ref 3.0–12.0)
Monocytes Absolute: 0.6 10*3/uL (ref 0.1–1.0)
NEUTROS ABS: 4 10*3/uL (ref 1.4–7.7)
NEUTROS PCT: 55.4 % (ref 43.0–77.0)
PLATELETS: 327 10*3/uL (ref 150.0–400.0)
RBC: 4.92 Mil/uL (ref 3.87–5.11)
RDW: 13.5 % (ref 11.5–15.5)
WBC: 7.1 10*3/uL (ref 4.0–10.5)

## 2017-01-16 NOTE — Patient Instructions (Addendum)
Slowly advance diet  Follow these instructions at home:  Take over-the-counter medicines only as told by your health care provider.  Drink enough fluids to keep your urine clear or pale yellow.  Rest as needed.  To help with pain, try: ? Sipping warm liquids, such as broth, herbal tea, or warm water. ? Eating or drinking cold or frozen liquids, such as frozen ice pops. ? Gargling with a salt-water mixture 3-4 times a day or as needed. To make a salt-water mixture, completely dissolve -1 tsp of salt in 1 cup of warm water. ? Sucking on hard candy or throat lozenges. ? Putting a cool-mist humidifier in your bedroom at night to moisten the air. ? Sitting in the bathroom with the door closed for 5-10 minutes while you run hot water in the shower.

## 2017-01-16 NOTE — Progress Notes (Signed)
Pre visit review using our clinic review tool, if applicable. No additional management support is needed unless otherwise documented below in the visit note. 

## 2017-01-16 NOTE — Progress Notes (Signed)
Subjective:    Patient ID: Katelyn [name], female    DOB: 1956-03-21, 61 y.o.   MRN: [id number]  HPI  Admit date: 01/08/2017 Discharge date: 01/11/2017   Discharge Condition: Stable CODE STATUS: FULL Diet recommendation: Heart Healthy  Brief/Interim Summary: 60 year old female with a history of hypothyroidism, hypertension presented with new onset diarrhea that began on 01/05/2017 associated with abdominal cramping. She feels that this may have been related to eating some expired soup. She has not been on any antibiotics. She stated she had 6-7 loose bowel movements daily prior to admission and continues to have watery stools after admission. The patient also complained of nausea and vomiting although she states that her emesis has improved. She had a low-grade temperature of 99.67F at home. Because of decreased oral intake and increased generalized weakness with persistent symptoms she saw her primary care provider on 01/08/2017. She was since, sent to the emergency department for further evaluation. She denies any hematochezia, melena, hematemesis, chest pain, short of breath, dysuria, hematuria. She states that she has not had endoscopy or colonoscopy in over 10 years. CT of the abdomen and pelvis showed liquid stool in the right and transverse colon with distal sigmoid and rectal wall thickening. There was also focal narrowing in the mid sigmoid colon. The patient was started on IV fluids and intravenous meropenem. Because the patient had recurrent intractable vomiting with inability to tolerate oral medications, the patient was admitted for IV fluids and intravenous antibiotics for her UTI. Previous medical records reviewed and summarized.  Discharge Diagnoses:  Diarrhea/segmental colitis--viral gastroenteritis -Stool pathogen panel--POSITIVE for astrovirus -likely infectious  Hypothyroidism -Continue Synthroid  Dehydration/Intractable vomiting -The patient was hemoconcentrated  with elevated serum creatinine at the time of admission -Because the patient had recurrent vomiting with dehydration and inability to tolerate her oral medications the patient was admitted for intravenous fluids and IV antibiotics  Hypokalemia -Replete -Check magnesium--1.8  UTI -d/c merrem -UA with TNTC WBC -start ceftriaxone which pt tolerated without difficulty -no urine culture performed at time of admission -home with cefuroxime x 3 more days to complete 5 days of tx  Hypertension -restartatenolol  60 year old patient who is seen following a recent hospital discharge.  She was admitted with dehydration, viral gastroenteritis and urinary tract infection She remains weak, but seems to be improving.  She has returned to work. Diarrhea.  Larger resolved .  No fever.  She was discharged on Ceftin which was discontinued yesterday due to desquamation of the mouth  Past Medical History:  Diagnosis Date  . Anemia   . Chronic kidney disease    stones  . Headache(784.0)    cluster headaches, migraines- states is taking Atenolol at present as directed per Dr Lacinda Axon  to prevent migraine from increased stress-  states if on medication for several weeks that heart rate will drop to 40's  . HYPOTHYROIDISM 05/27/2007  . Pneumonia    post op x 2  . PONV (postoperative nausea and vomiting)   . WEIGHT GAIN 07/15/2009     Social History   Social History  . Marital status: Married    Spouse name: N/A  . Number of children: N/A  . Years of education: N/A   Occupational History  . Not on file.   Social History Main Topics  . Smoking status: Never Smoker  . Smokeless tobacco: Never Used  . Alcohol use No  . Drug use: No  . Sexual activity: Yes   Other Topics Concern  .  Not on file   Social History Narrative  . No narrative on file    Past Surgical History:  Procedure Laterality Date  . ABDOMINAL HYSTERECTOMY    . APPENDECTOMY    . DIAGNOSTIC LAPAROSCOPY    . FOOT  SURGERY  2007   tumor removed from right foot/    removal Mortons neuroma    years ago  . KNEE ARTHROSCOPY     x 2  . LAPAROTOMY  10/30/2011   Procedure: EXPLORATORY LAPAROTOMY;  Surgeon: Janie Morning, MD;  Location: [facility];  Service: Gynecology;  Laterality: N/A;  . SALPINGOOPHORECTOMY  10/30/2011   Procedure: SALPINGO OOPHERECTOMY;  Surgeon: Janie Morning, MD;  Location: [facility];  Service: Gynecology;  Laterality: Right;  RIGHT  . TONSILLECTOMY    . TUBAL LIGATION    . TYMPANOSTOMY TUBE PLACEMENT      Family History  Problem Relation Age of Onset  . Alzheimer's disease Mother   . Cervical cancer Mother   . Diabetes Sister   . Heart attack Brother   . Hypertension Neg Hx     family hx  . Heart disease Neg Hx     family hx    Allergies  Allergen Reactions  . Adhesive [Tape] Swelling and Other (See Comments)    Reaction:  Localized swelling   . Ciprofloxacin Hives and Shortness Of Breath  . Dimethicone Hives and Shortness Of Breath  . Erythromycin Hives and Shortness Of Breath  . Latex Hives and Shortness Of Breath  . Other Hives, Shortness Of Breath and Other (See Comments)    Pt is allergic to nitrile gloves.   . Penicillins Hives, Shortness Of Breath and Other (See Comments)    Has patient had a PCN reaction causing immediate rash, facial/tongue/throat swelling, SOB or lightheadedness with hypotension: Yes Has patient had a PCN reaction causing severe rash involving mucus membranes or skin necrosis: No Has patient had a PCN reaction that required hospitalization No Has patient had a PCN reaction occurring within the last 10 years: No If all of the above answers are "NO", then may proceed with Cephalosporin use.  . Red Dye Hives and Shortness Of Breath  . Shellfish Allergy Hives and Shortness Of Breath  . Sulfonamide Derivatives Hives and Shortness Of Breath  . Alcohol-Sulfur [Sulfur] Swelling and Other (See Comments)    Reaction:  Localized swelling   . Aspirin  Nausea And Vomiting  . Doxycycline Nausea And Vomiting  . Ceftin [Cefuroxime] Rash    Current Outpatient Prescriptions on File Prior to Visit  Medication Sig Dispense Refill  . albuterol (PROVENTIL HFA;VENTOLIN HFA) 108 (90 Base) MCG/ACT inhaler Inhale 2 puffs into the lungs every 6 (six) hours as needed for wheezing or shortness of breath. 1 Inhaler 0  . atenolol (TENORMIN) 25 MG tablet TAKE 1 TABLET (25 MG TOTAL) BY MOUTH EVERY MORNING. FOR MIGRAINES 90 tablet 1  . cholecalciferol (VITAMIN D) 1000 UNITS tablet Take 1,000 Units by mouth daily.     . Cyanocobalamin (VITAMIN B-12) 2500 MCG SUBL Place 2,500 mcg under the tongue daily.     . hyoscyamine (LEVSIN SL) 0.125 MG SL tablet Place 2 tablets (0.25 mg total) under the tongue 4 (four) times daily. 30 tablet 0  . phentermine 30 MG capsule TAKE 1 CAPSULE BY MOUTH DAILY (Patient taking differently: Take 30 mg by mouth daily. ) 90 capsule 1  . promethazine (PHENERGAN) 25 MG tablet Take 1 tablet (25 mg total) by mouth every 6 (six) hours  as needed for nausea or vomiting. 10 tablet 0  . SYNTHROID 125 MCG tablet TAKE 1 TABLET (125 MCG TOTAL) BY MOUTH DAILY. 90 tablet 1   No current facility-administered medications on file prior to visit.     BP (!) 128/8 (BP Location: Left Arm, Patient Position: Sitting, Cuff Size: Normal)   Pulse 77   Temp 98.4 F (36.9 C) (Oral)   Ht 5\' 7"  (1.702 m)   Wt 174 lb 6.4 oz (79.1 kg)   SpO2 99%   BMI 27.31 kg/m     Review of Systems  Constitutional: Positive for fatigue.  HENT: Positive for mouth sores. Negative for congestion, dental problem, hearing loss, rhinorrhea, sinus pressure, sore throat and tinnitus.   Eyes: Negative for pain, discharge and visual disturbance.  Respiratory: Negative for cough and shortness of breath.   Cardiovascular: Negative for chest pain, palpitations and leg swelling.  Gastrointestinal: Positive for diarrhea. Negative for abdominal distention, abdominal pain, blood in  stool, constipation, nausea and vomiting.  Genitourinary: Negative for difficulty urinating, dysuria, flank pain, frequency, hematuria, pelvic pain, urgency, vaginal bleeding, vaginal discharge and vaginal pain.  Musculoskeletal: Negative for arthralgias, gait problem and joint swelling.  Skin: Negative for rash.  Neurological: Positive for weakness. Negative for dizziness, syncope, speech difficulty, numbness and headaches.  Hematological: Negative for adenopathy.  Psychiatric/Behavioral: Negative for agitation, behavioral problems and dysphoric mood. The patient is not nervous/anxious.        Objective:   Physical Exam  Constitutional: She is oriented to person, place, and time. She appears well-developed and well-nourished.  HENT:  Head: Normocephalic.  Right Ear: External ear normal.  Left Ear: External ear normal.  Mouth/Throat: Oropharynx is clear and moist.  Eyes: Conjunctivae and EOM are normal. Pupils are equal, round, and reactive to light.  Neck: Normal range of motion. Neck supple. No thyromegaly present.  Cardiovascular: Normal rate, regular rhythm, normal heart sounds and intact distal pulses.   Pulmonary/Chest: Effort normal and breath sounds normal.  Abdominal: Soft. Bowel sounds are normal. She exhibits no mass. There is tenderness.  Very mild generalized tenderness.  No guarding.  Active bowel sounds  Musculoskeletal: Normal range of motion.  Lymphadenopathy:    She has no cervical adenopathy.  Neurological: She is alert and oriented to person, place, and time.  Skin: Skin is warm and dry. No rash noted.  Psychiatric: She has a normal mood and affect. Her behavior is normal.          Assessment & Plan:   Resolving viral gastroenteritis Status post UTI Ceftin allergy.  Medication discontinued and added to allergy list Essential hypertension  We'll continue to advance diet Follow-up CBC and chemistries Follow-up 6 months or as needed  Nyoka Cowden

## 2017-03-14 ENCOUNTER — Telehealth: Payer: Self-pay | Admitting: General Practice

## 2017-03-14 NOTE — Telephone Encounter (Signed)
Patient called and left a message Wednesday at 5:09pm said she was returning your call, I couldn't find any notes or when we called. Please call patient and advice. Her number is [phone].

## 2017-03-14 NOTE — Telephone Encounter (Signed)
Patient's number is an emergency contact for another patient Theophilus Kinds). We were contacted by [place] for [place] to schedule an appointment with Dr. Genevive Bi.  If Elysse calls back, please let her know that we need to see Theophilus Kinds for a consult. Then schedule an appointment as soon as possible. Thank you.

## 2017-03-29 DIAGNOSIS — G8929 Other chronic pain: Secondary | ICD-10-CM | POA: Diagnosis not present

## 2017-03-29 DIAGNOSIS — M25562 Pain in left knee: Secondary | ICD-10-CM | POA: Diagnosis not present

## 2017-04-06 DIAGNOSIS — M25562 Pain in left knee: Secondary | ICD-10-CM | POA: Diagnosis not present

## 2017-04-06 DIAGNOSIS — G8929 Other chronic pain: Secondary | ICD-10-CM | POA: Diagnosis not present

## 2017-04-08 ENCOUNTER — Encounter (HOSPITAL_COMMUNITY): Payer: Self-pay | Admitting: Emergency Medicine

## 2017-04-08 ENCOUNTER — Ambulatory Visit (HOSPITAL_COMMUNITY)
Admission: EM | Admit: 2017-04-08 | Discharge: 2017-04-08 | Disposition: A | Discharge status: Home / Self Care | Payer: 59 | Attending: Internal Medicine | Admitting: Internal Medicine

## 2017-04-08 ENCOUNTER — Ambulatory Visit (INDEPENDENT_AMBULATORY_CARE_PROVIDER_SITE_OTHER): Payer: 59

## 2017-04-08 DIAGNOSIS — M79671 Pain in right foot: Secondary | ICD-10-CM

## 2017-04-08 DIAGNOSIS — S90121A Contusion of right lesser toe(s) without damage to nail, initial encounter: Secondary | ICD-10-CM | POA: Diagnosis not present

## 2017-04-08 NOTE — ED Triage Notes (Signed)
Late yesterday, patient run over right foot with a wheelchair .  Little toe on right foot is bruised and swollen.

## 2017-04-08 NOTE — Discharge Instructions (Signed)
Wear Foot boot for support. May continue Ibuprofen 600mg  every 6 hours as needed for pain. Follow-up with your Orthopedic as planned next week for recheck or sooner if symptoms worsen.

## 2017-04-08 NOTE — ED Provider Notes (Signed)
CSN: [id number]     Arrival date & time 04/08/17  1558 History   First MD Initiated Contact with Patient 04/08/17 1727     Chief Complaint  Patient presents with  . Foot Pain   (Consider location/radiation/quality/duration/timing/severity/associated sxs/prior Treatment) 61 year old female presents with right foot pain. She was pushing her husband in his wheelchair yesterday when her foot got caught under the wheel. Twisted her right 5th toe around and caused immediate pain. Now toe is swollen, red and painful. She has taken Advil with some relief. History of previous injury/fracture to toe. Allergic to multiple medications- unable to tape or bandage foot due to allergies. History of thyroid issues and migraines- takes daily medication for management.    The history is provided by the patient.    Past Medical History:  Diagnosis Date  . Anemia   . Chronic kidney disease    stones  . Headache(784.0)    cluster headaches, migraines- states is taking Atenolol at present as directed per Dr Lacinda Axon  to prevent migraine from increased stress-  states if on medication for several weeks that heart rate will drop to 40's  . HYPOTHYROIDISM 05/27/2007  . Pneumonia    post op x 2  . PONV (postoperative nausea and vomiting)   . WEIGHT GAIN 07/15/2009   Past Surgical History:  Procedure Laterality Date  . ABDOMINAL HYSTERECTOMY    . APPENDECTOMY    . DIAGNOSTIC LAPAROSCOPY    . FOOT SURGERY  2007   tumor removed from right foot/    removal Mortons neuroma    years ago  . KNEE ARTHROSCOPY     x 2  . LAPAROTOMY  10/30/2011   Procedure: EXPLORATORY LAPAROTOMY;  Surgeon: Janie Morning, MD;  Location: [facility];  Service: Gynecology;  Laterality: N/A;  . SALPINGOOPHORECTOMY  10/30/2011   Procedure: SALPINGO OOPHERECTOMY;  Surgeon: Janie Morning, MD;  Location: [facility];  Service: Gynecology;  Laterality: Right;  RIGHT  . TONSILLECTOMY    . TUBAL LIGATION    . TYMPANOSTOMY TUBE PLACEMENT      Family History  Problem Relation Age of Onset  . Alzheimer's disease Mother   . Cervical cancer Mother   . Diabetes Sister   . Heart attack Brother   . Hypertension Neg Hx        family hx  . Heart disease Neg Hx        family hx   Social History  Substance Use Topics  . Smoking status: Never Smoker  . Smokeless tobacco: Never Used  . Alcohol use No   OB History    No data available     Review of Systems  Constitutional: Negative for activity change, chills and fever.  Gastrointestinal: Negative for nausea and vomiting.  Musculoskeletal: Positive for arthralgias, gait problem, joint swelling and myalgias.  Skin: Positive for color change. Negative for rash and wound.  Allergic/Immunologic: Positive for environmental allergies and food allergies.  Neurological: Negative for dizziness, syncope, weakness, numbness and headaches.  Hematological: Does not bruise/bleed easily.    Allergies  Adhesive [tape]; Ciprofloxacin; Dimethicone; Erythromycin; Latex; Other; Penicillins; Red dye; Shellfish allergy; Sulfonamide derivatives; Alcohol-sulfur [sulfur]; Aspirin; Doxycycline; and Ceftin [cefuroxime]  Home Medications   Prior to Admission medications   Medication Sig Start Date End Date Taking? Authorizing Provider  albuterol (PROVENTIL HFA;VENTOLIN HFA) 108 (90 Base) MCG/ACT inhaler Inhale 2 puffs into the lungs every 6 (six) hours as needed for wheezing or shortness of breath. 09/27/16  Marletta Lor, MD  atenolol (TENORMIN) 25 MG tablet TAKE 1 TABLET (25 MG TOTAL) BY MOUTH EVERY MORNING. FOR MIGRAINES 12/31/16   Marletta Lor, MD  cholecalciferol (VITAMIN D) 1000 UNITS tablet Take 1,000 Units by mouth daily.     [provider]  Cyanocobalamin (VITAMIN B-12) 2500 MCG SUBL Place 2,500 mcg under the tongue daily.     [provider]  phentermine 30 MG capsule TAKE 1 CAPSULE BY MOUTH DAILY Patient taking differently: Take 30 mg by mouth daily.   10/12/16   Marletta Lor, MD  promethazine (PHENERGAN) 25 MG tablet Take 1 tablet (25 mg total) by mouth every 6 (six) hours as needed for nausea or vomiting. 01/11/17   Orson Eva, MD  SYNTHROID 125 MCG tablet TAKE 1 TABLET (125 MCG TOTAL) BY MOUTH DAILY. 12/31/16   Marletta Lor, MD   Meds Ordered and Administered this Visit  Medications - No data to display  BP (!) 142/78 (BP Location: Right Arm)   Pulse 92   Temp 98.7 F (37.1 C) (Oral)   Resp 18   SpO2 99%  No data found.   Physical Exam  Constitutional: She is oriented to person, place, and time. She appears well-developed and well-nourished. No distress.  HENT:  Head: Normocephalic and atraumatic.  Nose: Nose normal.  Mouth/Throat: Oropharynx is clear and moist.  Neck: Normal range of motion.  Cardiovascular: Normal rate and regular rhythm.   Pulmonary/Chest: Effort normal.  Musculoskeletal: She exhibits edema and tenderness.       Right foot: There is decreased range of motion, tenderness, swelling and deformity. There is normal capillary refill.       Feet:  Right 5th toe swollen and inverted. Minimal movement. Bruising present on distal joint and base of 4th and 5th metatarsal. Very tender. Good pulses and capillary refill. No neuro deficits noted.   Neurological: She is alert and oriented to person, place, and time. She has normal strength. No sensory deficit.  Skin: Skin is warm and dry. Capillary refill takes less than 2 seconds. No rash noted. There is erythema.  Psychiatric: She has a normal mood and affect. Her behavior is normal. Judgment and thought content normal.    Urgent Care Course     Procedures (including critical care time)  Labs Review Labs Reviewed - No data to display  Imaging Review Dg Foot Complete Right  Result Date: 04/08/2017 CLINICAL DATA:  Right 5th toe pain/injury EXAM: RIGHT FOOT COMPLETE - 3+ VIEW COMPARISON:  None. FINDINGS: No fracture or dislocation is seen. The joint  spaces are preserved. The visualized soft tissues are unremarkable. IMPRESSION: Negative. Electronically Signed   By: Julian Hy M.D.   On: 04/08/2017 18:01     Visual Acuity Review  Right Eye Distance:   Left Eye Distance:   Bilateral Distance:    Right Eye Near:   Left Eye Near:    Bilateral Near:         MDM   1. Contusion of fifth toe, right, initial encounter   2. Foot pain, right    Reviewed negative x-ray results with patient. Probably has a contusion with minor sprain. Since unable to use tape or wrap due to allergies- provided Cam Walker boot for support since she needs a hard shoe to wear for work. May continue Ibuprofen 600mg  every 6 hours as needed for pain. Patient has appointment with her Orthopedic next week for other issues (left knee pain). Recommend follow-up with  her Orthopedic as planned or contact them sooner if symptoms do not improve or worsen.     Katy Apo, NP 04/09/17 [phone]

## 2017-04-16 DIAGNOSIS — G8929 Other chronic pain: Secondary | ICD-10-CM | POA: Diagnosis not present

## 2017-04-16 DIAGNOSIS — M25562 Pain in left knee: Secondary | ICD-10-CM | POA: Diagnosis not present

## 2017-04-23 ENCOUNTER — Encounter (HOSPITAL_COMMUNITY): Payer: Self-pay | Admitting: *Deleted

## 2017-04-23 ENCOUNTER — Other Ambulatory Visit: Payer: Self-pay | Admitting: Urology

## 2017-04-23 ENCOUNTER — Encounter (HOSPITAL_COMMUNITY): Payer: Self-pay | Admitting: Emergency Medicine

## 2017-04-23 ENCOUNTER — Emergency Department (HOSPITAL_COMMUNITY)
Admission: EM | Admit: 2017-04-23 | Discharge: 2017-04-23 | Disposition: A | Discharge status: Home / Self Care | Payer: 59 | Attending: Emergency Medicine | Admitting: Emergency Medicine

## 2017-04-23 ENCOUNTER — Emergency Department (HOSPITAL_COMMUNITY): Payer: 59

## 2017-04-23 DIAGNOSIS — E039 Hypothyroidism, unspecified: Secondary | ICD-10-CM | POA: Diagnosis not present

## 2017-04-23 DIAGNOSIS — N189 Chronic kidney disease, unspecified: Secondary | ICD-10-CM | POA: Insufficient documentation

## 2017-04-23 DIAGNOSIS — N201 Calculus of ureter: Secondary | ICD-10-CM | POA: Diagnosis not present

## 2017-04-23 DIAGNOSIS — Z79899 Other long term (current) drug therapy: Secondary | ICD-10-CM | POA: Diagnosis not present

## 2017-04-23 DIAGNOSIS — R1031 Right lower quadrant pain: Secondary | ICD-10-CM | POA: Diagnosis present

## 2017-04-23 DIAGNOSIS — R109 Unspecified abdominal pain: Secondary | ICD-10-CM | POA: Diagnosis not present

## 2017-04-23 DIAGNOSIS — D649 Anemia, unspecified: Secondary | ICD-10-CM | POA: Insufficient documentation

## 2017-04-23 DIAGNOSIS — Z91040 Latex allergy status: Secondary | ICD-10-CM | POA: Insufficient documentation

## 2017-04-23 DIAGNOSIS — N202 Calculus of kidney with calculus of ureter: Secondary | ICD-10-CM | POA: Diagnosis not present

## 2017-04-23 LAB — URINALYSIS, ROUTINE W REFLEX MICROSCOPIC
Bacteria, UA: NONE SEEN
Bilirubin Urine: NEGATIVE
Glucose, UA: NEGATIVE mg/dL
Ketones, ur: NEGATIVE mg/dL
Nitrite: NEGATIVE
Protein, ur: NEGATIVE mg/dL
Specific Gravity, Urine: 1.012 (ref 1.005–1.030)
pH: 6 (ref 5.0–8.0)

## 2017-04-23 LAB — CBC
HCT: 40.1 % (ref 36.0–46.0)
Hemoglobin: 13.9 g/dL (ref 12.0–15.0)
MCH: 29.8 pg (ref 26.0–34.0)
MCHC: 34.7 g/dL (ref 30.0–36.0)
MCV: 85.9 fL (ref 78.0–100.0)
Platelets: 280 K/uL (ref 150–400)
RBC: 4.67 MIL/uL (ref 3.87–5.11)
RDW: 13.2 % (ref 11.5–15.5)
WBC: 11.4 K/uL — ABNORMAL HIGH (ref 4.0–10.5)

## 2017-04-23 LAB — BASIC METABOLIC PANEL WITH GFR
Anion gap: 9 (ref 5–15)
BUN: 13 mg/dL (ref 6–20)
CO2: 26 mmol/L (ref 22–32)
Calcium: 9.4 mg/dL (ref 8.9–10.3)
Chloride: 104 mmol/L (ref 101–111)
Creatinine, Ser: 1 mg/dL (ref 0.44–1.00)
GFR calc Af Amer: >60 mL/min (ref >60)
GFR calc non Af Amer: 60 mL/min — ABNORMAL LOW (ref >60)
Glucose, Bld: 124 mg/dL — ABNORMAL HIGH (ref 65–99)
Potassium: 2.9 mmol/L — ABNORMAL LOW (ref 3.5–5.1)
Sodium: 139 mmol/L (ref 135–145)

## 2017-04-23 MED ORDER — ONDANSETRON HCL 4 MG/2ML IJ SOLN
4.0000 mg | Freq: Once | INTRAMUSCULAR | Status: AC
  Administered 2017-04-23: 4 mg via INTRAVENOUS
  Filled 2017-04-23: qty 2

## 2017-04-23 MED ORDER — SODIUM CHLORIDE 0.9 % IV SOLN
Freq: Once | INTRAVENOUS | Status: AC
  Administered 2017-04-23: 04:00:00 via INTRAVENOUS

## 2017-04-23 MED ORDER — HYDROMORPHONE HCL 1 MG/ML IJ SOLN
1.0000 mg | Freq: Once | INTRAMUSCULAR | Status: AC
  Administered 2017-04-23: 1 mg via INTRAVENOUS
  Filled 2017-04-23: qty 1

## 2017-04-23 MED ORDER — ONDANSETRON 8 MG PO TBDP: 8.0000 mg | ORAL_TABLET | Freq: Three times a day (TID) | ORAL | 0 refills | Status: DC | PRN

## 2017-04-23 MED ORDER — HYDROMORPHONE HCL 2 MG PO TABS: 2.0000 mg | ORAL_TABLET | ORAL | 0 refills | Status: DC | PRN

## 2017-04-23 NOTE — ED Notes (Addendum)
Pt is alert and oriented x 4 and is verbally responsive. Pt is c/o of 10/10 Rt flank pain that began at 9pm yesterday. Pt is anxious and is unable to sit still d/t pain. Pt states " i think I have kidney stones" Pt reports that she has had kidney stones in the past. Pt states that she did not take any pain medication.

## 2017-04-23 NOTE — Progress Notes (Signed)
Called and left message on voice mail for [facility] to have Dr. Alyson Ingles call in Potassium supplement for patient as Potassium level is 2.9 and WBC-11.4. We will bring patient in on Thursday to re-check lab work  For surgery on 04/26/2017.

## 2017-04-23 NOTE — ED Provider Notes (Addendum)
[facility] DEPT Provider Note: Katelyn Spurling, MD, FACEP  CSN: [id number] MRN: [id number] ARRIVAL: 04/23/17 at [place]: [place]  Flank Pain   HISTORY OF PRESENT ILLNESS  Katelyn [name] is a 61 y.o. female with a history of nephrolithiasis. She is here with severe right-sided flank pain that began about 9:30 PM yesterday evening. It radiates to her right lower quadrant. She characterizes it as like prior kidney stones. There is been associated nausea and retching with very little emesis. She denies hematuria or dysuria. Pain is not significantly worse with movement.   Consultation with the [facility] state controlled substances database reveals the patient has received no opioid prescriptions in the past year.   Past Medical History:  Diagnosis Date  . Anemia   . Chronic kidney disease    stones  . Headache(784.0)    cluster headaches, migraines- states is taking Atenolol at present as directed per Dr Lacinda Axon  to prevent migraine from increased stress-  states if on medication for several weeks that heart rate will drop to 40's  . HYPOTHYROIDISM 05/27/2007  . Pneumonia    post op x 2  . PONV (postoperative nausea and vomiting)   . WEIGHT GAIN 07/15/2009    Past Surgical History:  Procedure Laterality Date  . ABDOMINAL HYSTERECTOMY    . APPENDECTOMY    . DIAGNOSTIC LAPAROSCOPY    . FOOT SURGERY  2007   tumor removed from right foot/    removal Mortons neuroma    years ago  . KNEE ARTHROSCOPY     x 2  . LAPAROTOMY  10/30/2011   Procedure: EXPLORATORY LAPAROTOMY;  Surgeon: Janie Morning, MD;  Location: [facility];  Service: Gynecology;  Laterality: N/A;  . SALPINGOOPHORECTOMY  10/30/2011   Procedure: SALPINGO OOPHERECTOMY;  Surgeon: Janie Morning, MD;  Location: [facility];  Service: Gynecology;  Laterality: Right;  RIGHT  . TONSILLECTOMY    . TUBAL LIGATION    . TYMPANOSTOMY TUBE PLACEMENT      Family History  Problem Relation Age of Onset    . Alzheimer's disease Mother   . Cervical cancer Mother   . Diabetes Sister   . Heart attack Brother   . Hypertension Neg Hx        family hx  . Heart disease Neg Hx        family hx    Social History  Substance Use Topics  . Smoking status: Never Smoker  . Smokeless tobacco: Never Used  . Alcohol use No    Prior to Admission medications   Medication Sig Start Date End Date Taking? Authorizing Provider  albuterol (PROVENTIL HFA;VENTOLIN HFA) 108 (90 Base) MCG/ACT inhaler Inhale 2 puffs into the lungs every 6 (six) hours as needed for wheezing or shortness of breath. 09/27/16  Yes Marletta Lor, MD  atenolol (TENORMIN) 25 MG tablet TAKE 1 TABLET (25 MG TOTAL) BY MOUTH EVERY MORNING. FOR MIGRAINES 12/31/16  Yes Marletta Lor, MD  cholecalciferol (VITAMIN D) 1000 UNITS tablet Take 1,000 Units by mouth daily.    Yes [provider]  Cyanocobalamin (VITAMIN B-12) 2500 MCG SUBL Place 2,500 mcg under the tongue daily.    Yes [provider]  phentermine 30 MG capsule TAKE 1 CAPSULE BY MOUTH DAILY Patient taking differently: Take 30 mg by mouth daily.  10/12/16  Yes Marletta Lor, MD  SYNTHROID 125 MCG tablet TAKE 1 TABLET (125 MCG TOTAL) BY MOUTH DAILY. 12/31/16  Yes  Marletta Lor, MD  promethazine (PHENERGAN) 25 MG tablet Take 1 tablet (25 mg total) by mouth every 6 (six) hours as needed for nausea or vomiting. Patient not taking: Reported on 04/23/2017 01/11/17   Orson Eva, MD    Allergies Adhesive [tape]; Ciprofloxacin; Dimethicone; Erythromycin; Latex; Other; Penicillins; Red dye; Shellfish allergy; Sulfonamide derivatives; Alcohol-sulfur [sulfur]; Aspirin; Doxycycline; and Ceftin [cefuroxime]   REVIEW OF SYSTEMS  Negative except as noted here or in the History of Present Illness.   PHYSICAL EXAMINATION  Initial Vital Signs Blood pressure (!) 202/103, pulse 72, temperature 97.8 F (36.6 C), temperature source Oral, resp. rate 18, height  5\' 7"  (1.702 m), weight 79.4 kg (175 lb), SpO2 100 %.  Examination General: Well-developed, well-nourished female in no acute distress; appearance consistent with age of record HENT: normocephalic; atraumatic Eyes: pupils equal, round and reactive to light; extraocular muscles intact Neck: supple Heart: regular rate and rhythm Lungs: clear to auscultation bilaterally Abdomen: soft; nondistended; nontender; no masses or hepatosplenomegaly; bowel sounds present GU: Mild right CVA tenderness Extremities: No deformity; full range of motion; pulses normal Neurologic: Awake, alert and oriented; motor function intact in all extremities and symmetric; no facial droop Skin: Warm and dry Psychiatric: flat affect   RESULTS  Summary of this visit's results, reviewed by myself:   EKG Interpretation  Date/Time:    Ventricular Rate:    PR Interval:    QRS Duration:   QT Interval:    QTC Calculation:   R Axis:     Text Interpretation:        Laboratory Studies: Results for orders placed or performed during the hospital encounter of 04/23/17 (from the past 24 hour(s))  Urinalysis, Routine w reflex microscopic- may I&O cath if menses     Status: Abnormal   Collection Time: 04/23/17  2:33 AM  Result Value Ref Range   Color, Urine STRAW (A) YELLOW   APPearance CLEAR CLEAR   Specific Gravity, Urine 1.012 1.005 - 1.030   pH 6.0 5.0 - 8.0   Glucose, UA NEGATIVE NEGATIVE mg/dL   Hgb urine dipstick MODERATE (A) NEGATIVE   Bilirubin Urine NEGATIVE NEGATIVE   Ketones, ur NEGATIVE NEGATIVE mg/dL   Protein, ur NEGATIVE NEGATIVE mg/dL   Nitrite NEGATIVE NEGATIVE   Leukocytes, UA TRACE (A) NEGATIVE   RBC / HPF 6-30 0 - 5 RBC/hpf   WBC, UA 0-5 0 - 5 WBC/hpf   Bacteria, UA NONE SEEN NONE SEEN   Squamous Epithelial / LPF 0-5 (A) NONE SEEN   Mucous PRESENT   CBC     Status: Abnormal   Collection Time: 04/23/17  3:10 AM  Result Value Ref Range   WBC 11.4 (H) 4.0 - 10.5 K/uL   RBC 4.67 3.87 -  5.11 MIL/uL   Hemoglobin 13.9 12.0 - 15.0 g/dL   HCT 40.1 36.0 - 46.0 %   MCV 85.9 78.0 - 100.0 fL   MCH 29.8 26.0 - 34.0 pg   MCHC 34.7 30.0 - 36.0 g/dL   RDW 13.2 11.5 - 15.5 %   Platelets 280 150 - 400 K/uL  Basic metabolic panel     Status: Abnormal   Collection Time: 04/23/17  3:10 AM  Result Value Ref Range   Sodium 139 135 - 145 mmol/L   Potassium 2.9 (L) 3.5 - 5.1 mmol/L   Chloride 104 101 - 111 mmol/L   CO2 26 22 - 32 mmol/L   Glucose, Bld 124 (H) 65 - 99 mg/dL  BUN 13 6 - 20 mg/dL   Creatinine, Ser 1.00 0.44 - 1.00 mg/dL   Calcium 9.4 8.9 - 10.3 mg/dL   GFR calc non Af Amer 60 (L) >60 mL/min   GFR calc Af Amer >60 >60 mL/min   Anion gap 9 5 - 15   Imaging Studies: Ct Renal Stone Study  Result Date: 04/23/2017 CLINICAL DATA:  Acute onset of right flank pain. Leukocytosis. Red blood cells and white blood cells in the urine. Initial encounter. EXAM: CT ABDOMEN AND PELVIS WITHOUT CONTRAST TECHNIQUE: Multidetector CT imaging of the abdomen and pelvis was performed following the standard protocol without IV contrast. COMPARISON:  CT of the abdomen and pelvis performed 01/08/2017, and pelvic ultrasound performed 08/31/2011 FINDINGS: Lower chest: Mild bibasilar atelectasis is noted. Calcification is seen at the mitral valve. Hepatobiliary: There is diffuse fatty infiltration within the liver. The gallbladder is unremarkable in appearance. The common bile duct remains normal in size. Pancreas: The pancreas is within normal limits. Spleen: The spleen is unremarkable in appearance. Adrenals/Urinary Tract: The adrenal glands are unremarkable in appearance. Moderate right-sided hydronephrosis is noted, with an obstructing 7 x 6 mm stone at the proximal right ureter, just below the right renal pelvis. Right-sided perinephric stranding and fluid are noted. Scattered nonobstructing bilateral renal stones are seen, larger on the right. Prominent left renal parapelvic cysts are noted.  Stomach/Bowel: The stomach is unremarkable in appearance. The small bowel is within normal limits. The appendix is normal in caliber, without evidence of appendicitis. The colon is unremarkable in appearance. Vascular/Lymphatic: Scattered calcification is seen along the abdominal aorta and its branches. A retroaortic left renal vein is noted. The abdominal aorta is otherwise grossly unremarkable. The inferior vena cava is grossly unremarkable. No retroperitoneal lymphadenopathy is seen. No pelvic sidewall lymphadenopathy is identified. Reproductive: The stomach is unremarkable in appearance. The small bowel is within normal limits. The patient is status post appendectomy. The colon is unremarkable in appearance. Other: No additional soft tissue abnormalities are seen. Musculoskeletal: No acute osseous abnormalities are identified. Mild vacuum phenomenon is noted at L5-S1. The visualized musculature is unremarkable in appearance. IMPRESSION: 1. Moderate right-sided hydronephrosis, with an obstructing large 7 x 6 mm stone at the proximal right ureter, just below the right renal pelvis. 2. Nonobstructing bilateral renal stones, larger on the right. 3. Prominent left renal parapelvic cysts noted. 4. Scattered aortic atherosclerosis. 5. Mild bibasilar atelectasis noted. Calcification at the mitral valve. 6. Diffuse fatty infiltration within the liver. Electronically Signed   By: Garald Balding M.D.   On: 04/23/2017 05:12    ED COURSE  Nursing notes and initial vitals signs, including pulse oximetry, reviewed.  Vitals:   04/23/17 0224 04/23/17 0248 04/23/17 0432  BP: (!) 198/108 (!) 202/103 (!) 171/98  Pulse: 81 72 88  Resp: (!) 22 18 16   Temp: 97.7 F (36.5 C) 97.8 F (36.6 C)   TempSrc: Oral Oral   SpO2: 100% 100% 91%  Weight: 79.4 kg (175 lb)    Height: 5\' 7"  (1.702 m)     5:17 AM Pain well controlled after IV medications. She was advised to CT findings. Patient does have an urologist with whom she  can follow-up, Dr. Jeffie Pollock.  PROCEDURES    ED DIAGNOSES     ICD-10-CM   1. Ureterolithiasis N20.1        Sohan Potvin, MD 04/23/17 3435    Shanon Rosser, MD 04/23/17 [phone]

## 2017-04-23 NOTE — ED Triage Notes (Signed)
Pt report having right sided flank pain that started around 2130 and has had prior hx of kidney stones and pain is similar.

## 2017-04-23 NOTE — ED Notes (Signed)
Pt made aware urine specimen is need, Pt stated she had just voided.

## 2017-04-23 NOTE — ED Notes (Signed)
Pt is having emesis, small amouts.

## 2017-04-24 ENCOUNTER — Other Ambulatory Visit: Payer: Self-pay | Admitting: Urology

## 2017-04-24 NOTE — Progress Notes (Signed)
Spoke with dr Candida Peeling anesthesia and made aware of potassium 2.9 on 04-23-17 labs and patient having repeat bmet lab drawnt 04-25-17 and patient may be cancelled on 04-26-17 if potassium is still 2.9 and to please make dr Alyson Ingles aware, patient does not need to come in for pre op consultation per dr Sabra Heck.

## 2017-04-24 NOTE — Progress Notes (Signed)
Spoke with selita bradsher and dr Noah Delaine wants patient to come in for a pre op evaluation by anesthesia before he will prescribe any potassium for patient, selita was told patient stated on 04-23-17 she cannot come in for any appointment except 800 am 04-25-17 for repeat lab work due to working. selita stated she would let dr Alyson Ingles know.

## 2017-04-25 ENCOUNTER — Encounter (HOSPITAL_COMMUNITY)
Admission: RE | Admit: 2017-04-25 | Discharge: 2017-04-25 | Disposition: A | Discharge status: Home / Self Care | Payer: 59 | Source: Ambulatory Visit | Attending: Urology | Admitting: Urology

## 2017-04-25 DIAGNOSIS — Z79899 Other long term (current) drug therapy: Secondary | ICD-10-CM | POA: Diagnosis not present

## 2017-04-25 DIAGNOSIS — N201 Calculus of ureter: Secondary | ICD-10-CM | POA: Diagnosis not present

## 2017-04-25 DIAGNOSIS — E039 Hypothyroidism, unspecified: Secondary | ICD-10-CM | POA: Diagnosis not present

## 2017-04-25 DIAGNOSIS — Z87442 Personal history of urinary calculi: Secondary | ICD-10-CM | POA: Diagnosis not present

## 2017-04-25 LAB — POTASSIUM: POTASSIUM: 3.9 mmol/L (ref 3.5–5.1)

## 2017-04-25 MED ORDER — GENTAMICIN SULFATE 40 MG/ML IJ SOLN
7.0000 mg/kg | INTRAVENOUS | Status: AC
  Administered 2017-04-26: 480.8 mg via INTRAVENOUS
  Filled 2017-04-25: qty 12

## 2017-04-25 NOTE — Progress Notes (Signed)
Patient had Potassium drawn this am. Results in EPIC and Potassium-3.9.

## 2017-04-26 ENCOUNTER — Encounter (HOSPITAL_COMMUNITY): Payer: Self-pay | Admitting: *Deleted

## 2017-04-26 ENCOUNTER — Encounter (HOSPITAL_COMMUNITY)
Admission: RE | Disposition: A | Discharge status: Home / Self Care | Payer: Self-pay | Source: Ambulatory Visit | Attending: Urology

## 2017-04-26 ENCOUNTER — Ambulatory Visit (HOSPITAL_COMMUNITY): Payer: 59 | Admitting: Anesthesiology

## 2017-04-26 ENCOUNTER — Ambulatory Visit (HOSPITAL_COMMUNITY): Payer: 59

## 2017-04-26 ENCOUNTER — Ambulatory Visit (HOSPITAL_COMMUNITY)
Admission: RE | Admit: 2017-04-26 | Discharge: 2017-04-26 | Disposition: A | Discharge status: Home / Self Care | Payer: 59 | Source: Ambulatory Visit | Attending: Urology | Admitting: Urology

## 2017-04-26 DIAGNOSIS — Z87442 Personal history of urinary calculi: Secondary | ICD-10-CM | POA: Diagnosis not present

## 2017-04-26 DIAGNOSIS — N201 Calculus of ureter: Secondary | ICD-10-CM | POA: Diagnosis not present

## 2017-04-26 DIAGNOSIS — Z79899 Other long term (current) drug therapy: Secondary | ICD-10-CM | POA: Insufficient documentation

## 2017-04-26 DIAGNOSIS — E039 Hypothyroidism, unspecified: Secondary | ICD-10-CM | POA: Diagnosis not present

## 2017-04-26 DIAGNOSIS — I10 Essential (primary) hypertension: Secondary | ICD-10-CM | POA: Diagnosis not present

## 2017-04-26 HISTORY — DX: Personal history of urinary calculi: Z87.442

## 2017-04-26 HISTORY — PX: CYSTOSCOPY WITH RETROGRADE PYELOGRAM, URETEROSCOPY AND STENT PLACEMENT: SHX5789

## 2017-04-26 HISTORY — PX: HOLMIUM LASER APPLICATION: SHX5852

## 2017-04-26 SURGERY — CYSTOURETEROSCOPY, WITH RETROGRADE PYELOGRAM AND STENT INSERTION
Anesthesia: Spinal | Laterality: Right

## 2017-04-26 MED ORDER — SUCCINYLCHOLINE CHLORIDE 200 MG/10ML IV SOSY
PREFILLED_SYRINGE | INTRAVENOUS | Status: DC | PRN
  Administered 2017-04-26: 100 mg via INTRAVENOUS

## 2017-04-26 MED ORDER — LIDOCAINE 2% (20 MG/ML) 5 ML SYRINGE
INTRAMUSCULAR | Status: DC | PRN
  Administered 2017-04-26: 50 mg via INTRAVENOUS

## 2017-04-26 MED ORDER — ONDANSETRON HCL 4 MG/2ML IJ SOLN
INTRAMUSCULAR | Status: DC | PRN
  Administered 2017-04-26: 4 mg via INTRAVENOUS

## 2017-04-26 MED ORDER — MIDAZOLAM HCL 5 MG/5ML IJ SOLN
INTRAMUSCULAR | Status: DC | PRN
  Administered 2017-04-26: 2 mg via INTRAVENOUS

## 2017-04-26 MED ORDER — PHENYLEPHRINE 40 MCG/ML (10ML) SYRINGE FOR IV PUSH (FOR BLOOD PRESSURE SUPPORT)
PREFILLED_SYRINGE | INTRAVENOUS | Status: AC
  Filled 2017-04-26: qty 10

## 2017-04-26 MED ORDER — PROMETHAZINE HCL 25 MG/ML IJ SOLN
6.2500 mg | INTRAMUSCULAR | Status: AC | PRN
  Administered 2017-04-26 (×2): 6.25 mg via INTRAVENOUS

## 2017-04-26 MED ORDER — EPHEDRINE SULFATE-NACL 50-0.9 MG/10ML-% IV SOSY
PREFILLED_SYRINGE | INTRAVENOUS | Status: DC | PRN
  Administered 2017-04-26: 10 mg via INTRAVENOUS
  Administered 2017-04-26: 5 mg via INTRAVENOUS
  Administered 2017-04-26: 10 mg via INTRAVENOUS
  Administered 2017-04-26 (×2): 5 mg via INTRAVENOUS

## 2017-04-26 MED ORDER — ATROPINE SULFATE 1 MG/ML IJ SOLN
INTRAMUSCULAR | Status: DC | PRN
  Administered 2017-04-26: .4 mg via INTRAVENOUS
  Administered 2017-04-26: .1 mg via INTRAVENOUS

## 2017-04-26 MED ORDER — LACTATED RINGERS IV SOLN
INTRAVENOUS | Status: DC
  Administered 2017-04-26: 11:00:00 via INTRAVENOUS

## 2017-04-26 MED ORDER — PROPOFOL 10 MG/ML IV BOLUS: INTRAVENOUS | Status: DC | PRN

## 2017-04-26 MED ORDER — PROMETHAZINE HCL 25 MG/ML IJ SOLN
INTRAMUSCULAR | Status: AC
  Administered 2017-04-26: 6.25 mg via INTRAVENOUS
  Filled 2017-04-26: qty 1

## 2017-04-26 MED ORDER — MIDAZOLAM HCL 2 MG/2ML IJ SOLN
INTRAMUSCULAR | Status: AC
  Filled 2017-04-26: qty 2

## 2017-04-26 MED ORDER — PROPOFOL 500 MG/50ML IV EMUL
INTRAVENOUS | Status: DC | PRN
  Administered 2017-04-26: 75 ug/kg/min via INTRAVENOUS

## 2017-04-26 MED ORDER — SUCCINYLCHOLINE CHLORIDE 200 MG/10ML IV SOSY: PREFILLED_SYRINGE | INTRAVENOUS | Status: DC | PRN

## 2017-04-26 MED ORDER — FENTANYL CITRATE (PF) 100 MCG/2ML IJ SOLN
INTRAMUSCULAR | Status: DC | PRN
  Administered 2017-04-26 (×2): 50 ug via INTRAVENOUS

## 2017-04-26 MED ORDER — HYDROMORPHONE HCL 2 MG PO TABS: 2.0000 mg | ORAL_TABLET | ORAL | 0 refills | Status: DC | PRN

## 2017-04-26 MED ORDER — ONDANSETRON HCL 4 MG/2ML IJ SOLN
INTRAMUSCULAR | Status: AC
  Filled 2017-04-26: qty 2

## 2017-04-26 MED ORDER — IOHEXOL 300 MG/ML  SOLN
INTRAMUSCULAR | Status: DC | PRN
  Administered 2017-04-26: 10 mL

## 2017-04-26 MED ORDER — SODIUM CHLORIDE 0.9 % IR SOLN
Status: DC | PRN
  Administered 2017-04-26: 4000 mL via INTRAVESICAL

## 2017-04-26 MED ORDER — LIDOCAINE 2% (20 MG/ML) 5 ML SYRINGE
INTRAMUSCULAR | Status: AC
  Filled 2017-04-26: qty 5

## 2017-04-26 MED ORDER — PROPOFOL 10 MG/ML IV BOLUS
INTRAVENOUS | Status: AC
Start: 2017-04-26 — End: ?
  Filled 2017-04-26: qty 20

## 2017-04-26 MED ORDER — FENTANYL CITRATE (PF) 100 MCG/2ML IJ SOLN
25.0000 ug | INTRAMUSCULAR | Status: DC | PRN
  Administered 2017-04-26: 50 ug via INTRAVENOUS

## 2017-04-26 MED ORDER — FENTANYL CITRATE (PF) 100 MCG/2ML IJ SOLN
INTRAMUSCULAR | Status: AC
  Filled 2017-04-26: qty 2

## 2017-04-26 MED ORDER — PROPOFOL 10 MG/ML IV BOLUS
INTRAVENOUS | Status: DC | PRN
  Administered 2017-04-26: 120 mg via INTRAVENOUS

## 2017-04-26 MED ORDER — FENTANYL CITRATE (PF) 100 MCG/2ML IJ SOLN
INTRAMUSCULAR | Status: AC
  Administered 2017-04-26: 50 ug via INTRAVENOUS
  Filled 2017-04-26: qty 2

## 2017-04-26 MED ORDER — PHENYLEPHRINE 40 MCG/ML (10ML) SYRINGE FOR IV PUSH (FOR BLOOD PRESSURE SUPPORT)
PREFILLED_SYRINGE | INTRAVENOUS | Status: DC | PRN
  Administered 2017-04-26 (×4): 80 ug via INTRAVENOUS

## 2017-04-26 SURGICAL SUPPLY — 31 items
BAG URO CATCHER STRL LF (MISCELLANEOUS) ×2 IMPLANT
BASKET DAKOTA 1.9FR 11X120 (BASKET) IMPLANT
BASKET LASER NITINOL 1.9FR (BASKET) IMPLANT
BSKT STON RTRVL 120 1.9FR (BASKET)
CATH FOLEY LATEX FREE 20FR (CATHETERS)
CATH FOLEY LF 20FR (CATHETERS) IMPLANT
CATH INTERMIT  6FR 70CM (CATHETERS) ×2 IMPLANT
CLOTH BEACON ORANGE TIMEOUT ST (SAFETY) ×2 IMPLANT
COVER SURGICAL LIGHT HANDLE (MISCELLANEOUS) ×2 IMPLANT
EXTRACTOR STONE NITINOL NGAGE (UROLOGICAL SUPPLIES) ×1 IMPLANT
FIBER LASER FLEXIVA 1000 (UROLOGICAL SUPPLIES) IMPLANT
FIBER LASER FLEXIVA 365 (UROLOGICAL SUPPLIES) IMPLANT
FIBER LASER FLEXIVA 550 (UROLOGICAL SUPPLIES) IMPLANT
FIBER LASER TRAC TIP (UROLOGICAL SUPPLIES) ×1 IMPLANT
GLOVE BIO SURGEON STRL SZ8 (GLOVE) ×2 IMPLANT
GLOVE SURG SS PI 6.5 STRL IVOR (GLOVE) ×10 IMPLANT
GLOVE SURG SS PI 7.0 STRL IVOR (GLOVE) ×4 IMPLANT
GOWN STRL REUS W/TWL XL LVL3 (GOWN DISPOSABLE) ×2 IMPLANT
GUIDEWIRE ANG ZIPWIRE 038X150 (WIRE) ×3 IMPLANT
GUIDEWIRE STR DUAL SENSOR (WIRE) ×2 IMPLANT
IV NS 1000ML (IV SOLUTION) ×2
IV NS 1000ML BAXH (IV SOLUTION) ×1 IMPLANT
MANIFOLD NEPTUNE II (INSTRUMENTS) ×2 IMPLANT
PACK CYSTO (CUSTOM PROCEDURE TRAY) ×2 IMPLANT
SHEATH ACCESS URETERAL 38CM (SHEATH) IMPLANT
STENT CONTOUR 6FRX26X.038 (STENTS) IMPLANT
STENT URET 6FRX26 CONTOUR (STENTS) ×1 IMPLANT
SYR CONTROL 10ML LL (SYRINGE) IMPLANT
SYRINGE IRR TOOMEY STRL 70CC (SYRINGE) IMPLANT
TUBE FEEDING 8FR 16IN STR KANG (MISCELLANEOUS) IMPLANT
TUBING CONNECTING 10 (TUBING) ×2 IMPLANT

## 2017-04-26 NOTE — H&P (Signed)
Urology Admission H&P  Chief Complaint: rigth flank pain  History of Present Illness: [name] is a 61yo with a 66m right UPJ calculus who has failed MET  Past Medical History:  Diagnosis Date  . Anemia   . Chronic kidney disease    stones  . Headache(784.0)    cluster headaches, migraines- states is taking Atenolol at present as directed per Dr KLacinda Axon to prevent migraine from increased stress-  states if on medication for several weeks that heart rate will drop to 40's  . History of kidney stones   . HYPOTHYROIDISM 05/27/2007  . Pneumonia    post op x 2  . PONV (postoperative nausea and vomiting)   . WEIGHT GAIN 07/15/2009   Past Surgical History:  Procedure Laterality Date  . ABDOMINAL HYSTERECTOMY    . APPENDECTOMY    . DIAGNOSTIC LAPAROSCOPY    . FOOT SURGERY  2007   tumor removed from right foot/    removal Mortons neuroma    years ago  . KNEE ARTHROSCOPY     x 2  . LAPAROTOMY  10/30/2011   Procedure: EXPLORATORY LAPAROTOMY;  Surgeon: WJanie Morning MD;  Location: [facility];  Service: Gynecology;  Laterality: N/A;  . SALPINGOOPHORECTOMY  10/30/2011   Procedure: SALPINGO OOPHERECTOMY;  Surgeon: WJanie Morning MD;  Location: [facility];  Service: Gynecology;  Laterality: Right;  RIGHT  . TONSILLECTOMY    . TUBAL LIGATION    . TYMPANOSTOMY TUBE PLACEMENT      Home Medications:  Prescriptions Prior to Admission  Medication Sig Dispense Refill Last Dose  . albuterol (PROVENTIL HFA;VENTOLIN HFA) 108 (90 Base) MCG/ACT inhaler Inhale 2 puffs into the lungs every 6 (six) hours as needed for wheezing or shortness of breath. 1 Inhaler 0 04/25/2017 at Unknown time  . atenolol (TENORMIN) 25 MG tablet TAKE 1 TABLET (25 MG TOTAL) BY MOUTH EVERY MORNING. FOR MIGRAINES (Patient taking differently: Take 25 mg by mouth daily as needed. For migraines) 90 tablet 1 04/26/2017 at 0830  . cholecalciferol (VITAMIN D) 1000 UNITS tablet Take 1,000 Units by mouth daily.    Past Week at Unknown time   . Cyanocobalamin (VITAMIN B-12) 2500 MCG SUBL Place 2,500 mcg under the tongue daily.    Past Week at Unknown time  . HYDROmorphone (DILAUDID) 2 MG tablet Take 1 tablet (2 mg total) by mouth every 4 (four) hours as needed for severe pain. 30 tablet 0 04/25/2017 at Unknown time  . ondansetron (ZOFRAN ODT) 8 MG disintegrating tablet Take 1 tablet (8 mg total) by mouth every 8 (eight) hours as needed for nausea or vomiting. 20 tablet 0 Past Week at Unknown time  . phentermine 30 MG capsule TAKE 1 CAPSULE BY MOUTH DAILY (Patient taking differently: Take 30 mg by mouth daily. ) 90 capsule 1 about 4 days ago at Unknown time  . SYNTHROID 125 MCG tablet TAKE 1 TABLET (125 MCG TOTAL) BY MOUTH DAILY. 90 tablet 1 04/26/2017 at 0830   Allergies:  Allergies  Allergen Reactions  . Adhesive [Tape] Swelling and Other (See Comments)    Reaction:  Localized swelling   . Ciprofloxacin Hives and Shortness Of Breath  . Dimethicone Hives and Shortness Of Breath  . Erythromycin Hives and Shortness Of Breath  . Latex Hives and Shortness Of Breath  . Other Hives, Shortness Of Breath and Other (See Comments)    Pt is allergic to nitrile gloves.   . Penicillins Hives, Shortness Of Breath and Other (See Comments)  Has patient had a PCN reaction causing immediate rash, facial/tongue/throat swelling, SOB or lightheadedness with hypotension: Yes Has patient had a PCN reaction causing severe rash involving mucus membranes or skin necrosis: No Has patient had a PCN reaction that required hospitalization No Has patient had a PCN reaction occurring within the last 10 years: No If all of the above answers are "NO", then may proceed with Cephalosporin use.  . Red Dye Hives and Shortness Of Breath  . Shellfish Allergy Hives and Shortness Of Breath  . Sulfonamide Derivatives Hives and Shortness Of Breath  . Alcohol-Sulfur [Sulfur] Swelling and Other (See Comments)    Reaction:  Localized swelling   . Aspirin Nausea And  Vomiting  . Doxycycline Nausea And Vomiting  . Ceftin [Cefuroxime] Rash    Family History  Problem Relation Age of Onset  . Alzheimer's disease Mother   . Cervical cancer Mother   . Diabetes Sister   . Heart attack Brother   . Hypertension Neg Hx        family hx  . Heart disease Neg Hx        family hx   Social History:  reports that she has never smoked. She has never used smokeless tobacco. She reports that she does not drink alcohol or use drugs.  Review of Systems  Genitourinary: Positive for flank pain, frequency and urgency.  All other systems reviewed and are negative.   Physical Exam:  Vital signs in last 24 hours: Temp:  [98.3 F (36.8 C)] 98.3 F (36.8 C) (06/15 0949) Resp:  [16] 16 (06/15 0949) BP: (148)/(91) 148/91 (06/15 0949) SpO2:  [100 %] 100 % (06/15 0949) Physical Exam  Constitutional: She is oriented to person, place, and time. She appears well-developed and well-nourished.  HENT:  Head: Normocephalic and atraumatic.  Eyes: EOM are normal. Pupils are equal, round, and reactive to light.  Neck: Normal range of motion. No thyromegaly present.  Cardiovascular: Normal rate and regular rhythm.   Respiratory: Effort normal. No respiratory distress.  GI: Soft. She exhibits no distension.  Musculoskeletal: Normal range of motion. She exhibits no edema.  Neurological: She is alert and oriented to person, place, and time.  Skin: Skin is warm and dry.  Psychiatric: She has a normal mood and affect. Her behavior is normal. Judgment and thought content normal.    Laboratory Data:  No results found for this or any previous visit (from the past 24 hour(s)). No results found for this or any previous visit (from the past 240 hour(s)). Creatinine:  Recent Labs  04/23/17 0310  CREATININE 1.00   Baseline Creatinine: 1  Impression/Assessment:  60yo with right ureteral calculus  Plan:  The risks/benefits/alternatives to R URS was explained to the patient  and she understands and wishes to proceed with surgery  Nicolette Bang 04/26/2017, 12:26 PM

## 2017-04-26 NOTE — Anesthesia Procedure Notes (Signed)
Spinal  Patient location during procedure: OR Staffing Anesthesiologist: Lyndle Herrlich Preanesthetic Checklist Completed: patient identified, site marked, surgical consent, pre-op evaluation, timeout performed, IV checked, risks and benefits discussed and monitors and equipment checked Spinal Block Patient position: sitting Prep: Betadine Patient monitoring: heart rate, cardiac monitor, continuous pulse ox and blood pressure Approach: midline Location: L3-4 Injection technique: single-shot Needle Needle type: Sprotte  Needle gauge: 24 G Needle length: 9 cm Assessment Sensory level: T6 Additional Notes Spinal Dosage in OR  .75% Bupivicaine ml       1.6

## 2017-04-26 NOTE — Discharge Instructions (Signed)

## 2017-04-26 NOTE — Transfer of Care (Signed)
Immediate Anesthesia Transfer of Care Note  Patient: Katelyn [name]  Procedure(s) Performed: Procedure(s): CYSTOSCOPY WITH RETROGRADE PYELOGRAM, URETEROSCOPY AND STENT PLACEMENT (Right) HOLMIUM LASER APPLICATION (Right)  Patient Location: PACU  Anesthesia Type:Spinal  Level of Consciousness:  sedated, patient cooperative and responds to stimulation  Airway & Oxygen Therapy:Patient Spontanous Breathing and Patient connected to face mask oxgen  Post-op Assessment:  Report given to PACU RN and Post -op Vital signs reviewed and stable  Post vital signs:  Reviewed and stable  Last Vitals:  Vitals:   04/26/17 0949 04/26/17 1336  BP: (!) 148/91 117/71  Pulse:  92  Resp: 16 17  Temp: 36.8 C 84.7 C    Complications: No apparent anesthesia complications

## 2017-04-26 NOTE — Anesthesia Procedure Notes (Signed)
Procedure Name: Intubation Date/Time: 04/26/2017 1:00 PM Performed by: Briasia Flinders, Virgel Gess Pre-anesthesia Checklist: Patient identified, Emergency Drugs available, Suction available, Patient being monitored and Timeout performed Patient Re-evaluated:Patient Re-evaluated prior to inductionOxygen Delivery Method: Circle system utilized Preoxygenation: Pre-oxygenation with 100% oxygen Intubation Type: IV induction Ventilation: Mask ventilation without difficulty Laryngoscope Size: Mac and 4 Grade View: Grade I Tube type: Oral Tube size: 7.5 mm Number of attempts: 1 Airway Equipment and Method: Stylet Placement Confirmation: ETT inserted through vocal cords under direct vision,  positive ETCO2,  CO2 detector and breath sounds checked- equal and bilateral Secured at: 22 cm Tube secured with: Tape Dental Injury: Teeth and Oropharynx as per pre-operative assessment

## 2017-04-26 NOTE — Op Note (Signed)
Preoperative diagnosis: Right ureteral stone  Postoperative diagnosis: Same  Procedure: 1 cystoscopy 2 right retrograde pyelography 3.  Intraoperative fluoroscopy, under one hour, with interpretation 4.  Right ureteroscopic stone manipulation with laser lithotripsy 5.  Right 6 x 26 JJ stent placement  Attending: Rosie Fate  Anesthesia: General  Estimated blood loss: None  Drains: Right 6 x 26 JJ ureteral stent with tether  Specimens: stone for analysis  Antibiotics: gentamicin  Findings: right proximal ureteral calculus with mild hydronephrosis.  Indications: Patient is a 61 year old female/female with a history of ureteral stone and who has failed medical expulsive therapy.  After discussing treatment options, she decided proceed with right ureteroscopic stone manipulation.  Procedure her in detail: The patient was brought to the operating room and a brief timeout was done to ensure correct patient, correct procedure, correct site.  General anesthesia was administered patient was placed in dorsal lithotomy position.  Her genitalia was then prepped and draped in usual sterile fashion.  A rigid 6 French cystoscope was passed in the urethra and the bladder.  Bladder was inspected free masses or lesions.  the right ureteral orifices were in the normal orthotopic locations.  a 6 french ureteral catheter was then instilled into the right ureter orifice.  a gentle retrograde was obtained and findings noted above.  we then placed a zip wire through the ureteral catheter and advanced up to the renal pelvis.  we then removed the cystoscope and cannulated the right ureteral orifice with a semirigid ureteroscope.  we then encountered the stone in the proximal ureter.  using a 200 nm laser fiber and fragmented the stone into smaller pieces.  the pieces were then removed with a engage basket.  We then placed and good coil was noted in the the renal pelvis under fluoroscopy and the bladder under  direct vision.   once all stone fragments were removed we then placed a 6 x 26 double-j ureteral stent over the original zip wire.  the stone fragments were then removed from the bladder and sent for analysis.   the bladder was then drained and this concluded the procedure which was well tolerated by patient.  Complications: None  Condition: Stable, extubated, transferred to PACU  Plan: Patient is to be discharged home as to follow-up in 2 weeks. She is to remove her stent by pulling the tether in 72 hours

## 2017-04-26 NOTE — Anesthesia Preprocedure Evaluation (Signed)
Anesthesia Evaluation  Patient identified by MRN, date of birth, ID band Patient awake    Reviewed: Allergy & Precautions, H&P , Patient's Chart, lab work & pertinent test results, reviewed documented beta blocker date and time   Airway Mallampati: II  TM Distance: >3 FB Neck ROM: full    Dental no notable dental hx.    Pulmonary    Pulmonary exam normal breath sounds clear to auscultation       Cardiovascular  Rhythm:regular Rate:Normal     Neuro/Psych    GI/Hepatic   Endo/Other    Renal/GU      Musculoskeletal   Abdominal   Peds  Hematology   Anesthesia Other Findings Occasional inhaler use; breathing well today  Lungs clear fearfull of all adhesives and alcohol hand cleaner  Will use spinal/betadine/ sedation  Reproductive/Obstetrics                             Anesthesia Physical Anesthesia Plan  ASA: II  Anesthesia Plan: Spinal   Post-op Pain Management:    Induction:   PONV Risk Score and Plan:   Airway Management Planned:   Additional Equipment:   Intra-op Plan:   Post-operative Plan:   Informed Consent: I have reviewed the patients History and Physical, chart, labs and discussed the procedure including the risks, benefits and alternatives for the proposed anesthesia with the patient or authorized representative who has indicated his/her understanding and acceptance.   Dental Advisory Given  Plan Discussed with: CRNA and Surgeon  Anesthesia Plan Comments: (  )        Anesthesia Quick Evaluation

## 2017-04-29 ENCOUNTER — Encounter (HOSPITAL_COMMUNITY): Payer: Self-pay | Admitting: Urology

## 2017-04-29 NOTE — Anesthesia Postprocedure Evaluation (Signed)
Anesthesia Post Note  Patient: Katelyn [name]  Procedure(s) Performed: Procedure(s) (LRB): CYSTOSCOPY WITH RETROGRADE PYELOGRAM, URETEROSCOPY AND STENT PLACEMENT (Right) HOLMIUM LASER APPLICATION (Right)     Patient location during evaluation: PACU Anesthesia Type: General Level of consciousness: awake and alert Pain management: pain level controlled Vital Signs Assessment: post-procedure vital signs reviewed and stable Respiratory status: spontaneous breathing, nonlabored ventilation, respiratory function stable and patient connected to nasal cannula oxygen Cardiovascular status: blood pressure returned to baseline and stable Postop Assessment: no signs of nausea or vomiting and spinal receding Anesthetic complications: no    Last Vitals:  Vitals:   04/26/17 1515 04/26/17 1830  BP: (!) 144/86 (!) 143/78  Pulse: 63 67  Resp: 15 18  Temp: 36.5 C     Last Pain:  Vitals:   04/26/17 1745  TempSrc:   PainSc: 0-No pain                 [name] EDWARD

## 2017-05-03 DIAGNOSIS — R1084 Generalized abdominal pain: Secondary | ICD-10-CM | POA: Diagnosis not present

## 2017-05-03 DIAGNOSIS — N201 Calculus of ureter: Secondary | ICD-10-CM | POA: Diagnosis not present

## 2017-05-06 DIAGNOSIS — N201 Calculus of ureter: Secondary | ICD-10-CM | POA: Diagnosis not present

## 2017-05-09 DIAGNOSIS — M23262 Derangement of other lateral meniscus due to old tear or injury, left knee: Secondary | ICD-10-CM | POA: Diagnosis not present

## 2017-05-09 DIAGNOSIS — M7138 Other bursal cyst, other site: Secondary | ICD-10-CM | POA: Diagnosis not present

## 2017-05-09 DIAGNOSIS — M23301 Other meniscus derangements, unspecified lateral meniscus, left knee: Secondary | ICD-10-CM | POA: Diagnosis not present

## 2017-05-09 DIAGNOSIS — M94262 Chondromalacia, left knee: Secondary | ICD-10-CM | POA: Diagnosis not present

## 2017-05-09 DIAGNOSIS — M23001 Cystic meniscus, unspecified lateral meniscus, left knee: Secondary | ICD-10-CM | POA: Diagnosis not present

## 2017-05-09 DIAGNOSIS — G8918 Other acute postprocedural pain: Secondary | ICD-10-CM | POA: Diagnosis not present

## 2017-06-29 DIAGNOSIS — M238X2 Other internal derangements of left knee: Secondary | ICD-10-CM | POA: Diagnosis not present

## 2017-07-06 ENCOUNTER — Other Ambulatory Visit: Payer: Self-pay | Admitting: Internal Medicine

## 2017-07-21 DIAGNOSIS — N9489 Other specified conditions associated with female genital organs and menstrual cycle: Secondary | ICD-10-CM | POA: Diagnosis not present

## 2017-07-21 DIAGNOSIS — R3915 Urgency of urination: Secondary | ICD-10-CM | POA: Diagnosis not present

## 2017-07-21 DIAGNOSIS — Z87442 Personal history of urinary calculi: Secondary | ICD-10-CM | POA: Insufficient documentation

## 2017-07-21 DIAGNOSIS — Z79899 Other long term (current) drug therapy: Secondary | ICD-10-CM | POA: Insufficient documentation

## 2017-07-21 DIAGNOSIS — R110 Nausea: Secondary | ICD-10-CM | POA: Insufficient documentation

## 2017-07-21 DIAGNOSIS — R319 Hematuria, unspecified: Secondary | ICD-10-CM | POA: Insufficient documentation

## 2017-07-21 DIAGNOSIS — R109 Unspecified abdominal pain: Secondary | ICD-10-CM | POA: Diagnosis not present

## 2017-07-21 DIAGNOSIS — R1032 Left lower quadrant pain: Secondary | ICD-10-CM | POA: Diagnosis not present

## 2017-07-21 DIAGNOSIS — N200 Calculus of kidney: Secondary | ICD-10-CM | POA: Diagnosis not present

## 2017-07-22 ENCOUNTER — Encounter (HOSPITAL_COMMUNITY): Payer: Self-pay | Admitting: Emergency Medicine

## 2017-07-22 ENCOUNTER — Emergency Department (HOSPITAL_COMMUNITY): Payer: 59

## 2017-07-22 ENCOUNTER — Emergency Department (HOSPITAL_COMMUNITY)
Admission: EM | Admit: 2017-07-22 | Discharge: 2017-07-22 | Disposition: A | Discharge status: Home / Self Care | Payer: 59 | Attending: Emergency Medicine | Admitting: Emergency Medicine

## 2017-07-22 DIAGNOSIS — B9620 Unspecified Escherichia coli [E. coli] as the cause of diseases classified elsewhere: Secondary | ICD-10-CM | POA: Diagnosis not present

## 2017-07-22 DIAGNOSIS — N200 Calculus of kidney: Secondary | ICD-10-CM | POA: Diagnosis not present

## 2017-07-22 DIAGNOSIS — N9489 Other specified conditions associated with female genital organs and menstrual cycle: Secondary | ICD-10-CM | POA: Diagnosis not present

## 2017-07-22 DIAGNOSIS — N201 Calculus of ureter: Secondary | ICD-10-CM | POA: Diagnosis not present

## 2017-07-22 DIAGNOSIS — N390 Urinary tract infection, site not specified: Secondary | ICD-10-CM | POA: Diagnosis not present

## 2017-07-22 DIAGNOSIS — R109 Unspecified abdominal pain: Secondary | ICD-10-CM

## 2017-07-22 LAB — BASIC METABOLIC PANEL
ANION GAP: 12 (ref 5–15)
BUN: 15 mg/dL (ref 6–20)
CHLORIDE: 101 mmol/L (ref 101–111)
CO2: 26 mmol/L (ref 22–32)
Calcium: 9.6 mg/dL (ref 8.9–10.3)
Creatinine, Ser: 0.91 mg/dL (ref 0.44–1.00)
GFR calc non Af Amer: >60 mL/min (ref >60)
Glucose, Bld: 160 mg/dL — ABNORMAL HIGH (ref 65–99)
POTASSIUM: 4.3 mmol/L (ref 3.5–5.1)
Sodium: 139 mmol/L (ref 135–145)

## 2017-07-22 LAB — URINALYSIS, ROUTINE W REFLEX MICROSCOPIC
Bacteria, UA: NONE SEEN
Bilirubin Urine: NEGATIVE
Glucose, UA: NEGATIVE mg/dL
KETONES UR: NEGATIVE mg/dL
Leukocytes, UA: NEGATIVE
Nitrite: NEGATIVE
Protein, ur: NEGATIVE mg/dL
SPECIFIC GRAVITY, URINE: 1.02 (ref 1.005–1.030)
SQUAMOUS EPITHELIAL / LPF: NONE SEEN
pH: 5 (ref 5.0–8.0)

## 2017-07-22 LAB — CBC WITH DIFFERENTIAL/PLATELET
BASOS PCT: 0 %
Basophils Absolute: 0 10*3/uL (ref 0.0–0.1)
Eosinophils Absolute: 0.1 10*3/uL (ref 0.0–0.7)
Eosinophils Relative: 1 %
HEMATOCRIT: 46.2 % — AB (ref 36.0–46.0)
HEMOGLOBIN: 15.8 g/dL — AB (ref 12.0–15.0)
Lymphocytes Relative: 16 %
Lymphs Abs: 1.8 10*3/uL (ref 0.7–4.0)
MCH: 29.4 pg (ref 26.0–34.0)
MCHC: 34.2 g/dL (ref 30.0–36.0)
MCV: 86 fL (ref 78.0–100.0)
MONOS PCT: 6 %
Monocytes Absolute: 0.6 10*3/uL (ref 0.1–1.0)
NEUTROS ABS: 8.7 10*3/uL — AB (ref 1.7–7.7)
NEUTROS PCT: 77 %
Platelets: 290 10*3/uL (ref 150–400)
RBC: 5.37 MIL/uL — ABNORMAL HIGH (ref 3.87–5.11)
RDW: 12.8 % (ref 11.5–15.5)
WBC: 11.2 10*3/uL — ABNORMAL HIGH (ref 4.0–10.5)

## 2017-07-22 MED ORDER — MORPHINE SULFATE (PF) 4 MG/ML IV SOLN
4.0000 mg | Freq: Once | INTRAVENOUS | Status: AC
  Administered 2017-07-22: 4 mg via INTRAVENOUS
  Filled 2017-07-22: qty 1

## 2017-07-22 MED ORDER — OXYCODONE-ACETAMINOPHEN 5-325 MG PO TABS
1.0000 | ORAL_TABLET | ORAL | Status: DC | PRN
Start: 2017-07-22 — End: 2017-07-22
  Administered 2017-07-22: 1 via ORAL
  Filled 2017-07-22: qty 1

## 2017-07-22 MED ORDER — ONDANSETRON 4 MG PO TBDP: 4.0000 mg | ORAL_TABLET | Freq: Three times a day (TID) | ORAL | 0 refills | Status: DC | PRN

## 2017-07-22 MED ORDER — OXYCODONE-ACETAMINOPHEN 5-325 MG PO TABS: 1.0000 | ORAL_TABLET | Freq: Four times a day (QID) | ORAL | 0 refills | Status: DC | PRN

## 2017-07-22 MED ORDER — OXYCODONE-ACETAMINOPHEN 5-325 MG PO TABS
1.0000 | ORAL_TABLET | Freq: Once | ORAL | Status: AC
  Administered 2017-07-22: 1 via ORAL
  Filled 2017-07-22: qty 1

## 2017-07-22 MED ORDER — ONDANSETRON HCL 4 MG/2ML IJ SOLN
4.0000 mg | Freq: Once | INTRAMUSCULAR | Status: AC
  Administered 2017-07-22: 4 mg via INTRAVENOUS
  Filled 2017-07-22: qty 2

## 2017-07-22 NOTE — ED Provider Notes (Signed)
[facility] DEPT Provider Note   CSN: [id number] Arrival date & time: 07/21/17  2334     History   Chief Complaint Chief Complaint  Patient presents with  . Flank Pain    HPI Katelyn [name] is a 61 y.o. female.  HPI  This is a 61 year old female with history chronic kidney disease, kidney stones who presents with left flank pain. Patient reports history of kidney stones. She has had several days of worsening left flank pain. Patient reports prior history of kidney stones on the right requiring intervention and retrieval. Last one was in July. Dr. Alyson Ingles is her urologist. Patient reports hematuria and urgency. No dysuria. Denies fevers. Currently rates her pain a 10 out of 10. She did take Tylenol did at home with some improvement but pain worsened. She denies any chest pain, shortness of breath. She does report nausea without vomiting.  Past Medical History:  Diagnosis Date  . Anemia   . Chronic kidney disease    stones  . Headache(784.0)    cluster headaches, migraines- states is taking Atenolol at present as directed per Dr Lacinda Axon  to prevent migraine from increased stress-  states if on medication for several weeks that heart rate will drop to 40's  . History of kidney stones   . HYPOTHYROIDISM 05/27/2007  . Pneumonia    post op x 2  . PONV (postoperative nausea and vomiting)   . WEIGHT GAIN 07/15/2009    Patient Active Problem List   Diagnosis Date Noted  . Viral gastroenteritis   . Dehydration   . Intractable vomiting with nausea   . Colitis 01/08/2017  . Hypokalemia 01/08/2017  . Fatty liver 01/08/2017  . Anterior chest wall pain 05/26/2015  . Hypertension 10/18/2011  . Nephrolithiasis 10/18/2011  . Ovarian mass 10/18/2011  . Pulmonary nodule, right 10/18/2011  . WEIGHT GAIN 07/15/2009  . Hypothyroidism 05/27/2007    Past Surgical History:  Procedure Laterality Date  . ABDOMINAL HYSTERECTOMY    . APPENDECTOMY    . CYSTOSCOPY WITH RETROGRADE  PYELOGRAM, URETEROSCOPY AND STENT PLACEMENT Right 04/26/2017   Procedure: CYSTOSCOPY WITH RETROGRADE PYELOGRAM, URETEROSCOPY AND STENT PLACEMENT;  Surgeon: Cleon Gustin, MD;  Location: [facility];  Service: Urology;  Laterality: Right;  . DIAGNOSTIC LAPAROSCOPY    . FOOT SURGERY  2007   tumor removed from right foot/    removal Mortons neuroma    years ago  . HOLMIUM LASER APPLICATION Right 08/02/1940   Procedure: HOLMIUM LASER APPLICATION;  Surgeon: Cleon Gustin, MD;  Location: [facility];  Service: Urology;  Laterality: Right;  . KNEE ARTHROSCOPY     x 2  . LAPAROTOMY  10/30/2011   Procedure: EXPLORATORY LAPAROTOMY;  Surgeon: Janie Morning, MD;  Location: [facility];  Service: Gynecology;  Laterality: N/A;  . SALPINGOOPHORECTOMY  10/30/2011   Procedure: SALPINGO OOPHERECTOMY;  Surgeon: Janie Morning, MD;  Location: [facility];  Service: Gynecology;  Laterality: Right;  RIGHT  . TONSILLECTOMY    . TUBAL LIGATION    . TYMPANOSTOMY TUBE PLACEMENT      OB History    No data available       Home Medications    Prior to Admission medications   Medication Sig Start Date End Date Taking? Authorizing Provider  albuterol (PROVENTIL HFA;VENTOLIN HFA) 108 (90 Base) MCG/ACT inhaler Inhale 2 puffs into the lungs every 6 (six) hours as needed for wheezing or shortness of breath. 09/27/16  Yes Marletta Lor, MD  atenolol (TENORMIN) 25  MG tablet TAKE 1 TABLET (25 MG TOTAL) BY MOUTH EVERY MORNING. FOR MIGRAINES Patient taking differently: Take 25 mg by mouth daily as needed. For migraines 12/31/16  Yes Marletta Lor, MD  Biotin 1 MG CAPS Take 1 mg by mouth daily.   Yes [provider]  cholecalciferol (VITAMIN D) 1000 UNITS tablet Take 1,000 Units by mouth daily.    Yes [provider]  Cranberry 1000 MG CAPS Take 1,000 mg by mouth daily.   Yes [provider]  Cyanocobalamin (VITAMIN B-12) 2500 MCG SUBL Place 2,500 mcg under the tongue daily.    Yes [provider]  cyclobenzaprine (FLEXERIL) 10 MG tablet Take 10 mg by mouth every 8 (eight) hours as needed for muscle spasms.  06/29/17  Yes [provider]  HYDROmorphone (DILAUDID) 2 MG tablet Take 1 tablet (2 mg total) by mouth every 4 (four) hours as needed for severe pain. 04/26/17  Yes McKenzie, Candee Furbish, MD  phentermine 30 MG capsule TAKE ONE CAPSULE BY MOUTH DAILY 07/09/17  Yes Marletta Lor, MD  predniSONE (DELTASONE) 10 MG tablet Take 10 mg by mouth daily. 06/29/17  Yes [provider]  SYNTHROID 125 MCG tablet TAKE 1 TABLET (125 MCG TOTAL) BY MOUTH DAILY. 12/31/16  Yes Marletta Lor, MD  tamsulosin (FLOMAX) 0.4 MG CAPS capsule Take 0.4 mg by mouth daily. 06/13/17  Yes [provider]  ondansetron (ZOFRAN ODT) 4 MG disintegrating tablet Take 1 tablet (4 mg total) by mouth every 8 (eight) hours as needed for nausea or vomiting. 07/22/17   Maeley Matton, Barbette Hair, MD  oxyCODONE-acetaminophen (PERCOCET/ROXICET) 5-325 MG tablet Take 1 tablet by mouth every 6 (six) hours as needed for severe pain. 07/22/17   Cristiana Yochim, Barbette Hair, MD    Family History Family History  Problem Relation Age of Onset  . Alzheimer's disease Mother   . Cervical cancer Mother   . Diabetes Sister   . Heart attack Brother   . Hypertension Neg Hx        family hx  . Heart disease Neg Hx        family hx    Social History Social History  Substance Use Topics  . Smoking status: Never Smoker  . Smokeless tobacco: Never Used  . Alcohol use No     Allergies   Adhesive [tape]; Ciprofloxacin; Dimethicone; Erythromycin; Latex; Other; Penicillins; Red dye; Shellfish allergy; Sulfonamide derivatives; Alcohol-sulfur [sulfur]; Aspirin; Doxycycline; and Ceftin [cefuroxime]   Review of Systems Review of Systems  Constitutional: Negative for fever.  Respiratory: Negative for shortness of breath.   Cardiovascular: Negative for chest pain.  Gastrointestinal: Positive for nausea. Negative  for diarrhea and vomiting.  Genitourinary: Positive for flank pain, hematuria and urgency. Negative for dysuria.  All other systems reviewed and are negative.    Physical Exam Updated Vital Signs BP 125/72 (BP Location: Right Arm)   Pulse 74   Temp 97.9 F (36.6 C) (Oral)   Resp 18   SpO2 99%   Physical Exam  Constitutional: She is oriented to person, place, and time. She appears well-developed and well-nourished. No distress.  HENT:  Head: Normocephalic and atraumatic.  Cardiovascular: Normal rate, regular rhythm and normal heart sounds.   No murmur heard. Pulmonary/Chest: Effort normal and breath sounds normal. No respiratory distress. She has no wheezes.  Abdominal: Soft. Bowel sounds are normal. She exhibits no mass. There is no tenderness.  Genitourinary:  Genitourinary Comments: No CVA tenderness  Neurological: She is  alert and oriented to person, place, and time.  Skin: Skin is warm and dry.  Psychiatric: She has a normal mood and affect.  Nursing note and vitals reviewed.    ED Treatments / Results  Labs (all labs ordered are listed, but only abnormal results are displayed) Labs Reviewed  URINALYSIS, ROUTINE W REFLEX MICROSCOPIC - Abnormal; Notable for the following:       Result Value   Hgb urine dipstick MODERATE (*)    All other components within normal limits  CBC WITH DIFFERENTIAL/PLATELET - Abnormal; Notable for the following:    WBC 11.2 (*)    RBC 5.37 (*)    Hemoglobin 15.8 (*)    HCT 46.2 (*)    Neutro Abs 8.7 (*)    All other components within normal limits  BASIC METABOLIC PANEL - Abnormal; Notable for the following:    Glucose, Bld 160 (*)    All other components within normal limits    EKG  EKG Interpretation None       Radiology Dg Abdomen 1 View  Result Date: 07/22/2017 CLINICAL DATA:  Left flank pain and patient with a known history of urinary tract stones. EXAM: ABDOMEN - 1 VIEW COMPARISON:  CT abdomen and pelvis 04/23/2017.  FINDINGS: A subtle 0.5 cm density projecting in the mid pole of the right kidney may be a nonobstructing stone. No other evidence of urinary tract stone is identified. Punctate pelvic phlebolith is noted. The bowel gas pattern is unremarkable. No focal bony abnormality. IMPRESSION: Possible 0.5 cm nonobstructing stone midpole right kidney. No other evidence of urinary tract stone. Electronically Signed   By: Inge Rise M.D.   On: 07/22/2017 02:07   US Renal  Result Date: 07/22/2017 CLINICAL DATA:  Left flank pain since yesterday. Nausea. Decreased urine output. History of kidney stones. EXAM: RENAL / URINARY TRACT ULTRASOUND COMPLETE COMPARISON:  CT abdomen and pelvis 04/23/2017 FINDINGS: Right Kidney: Length: 11.6 cm. Normal parenchymal echotexture and thickness. Parapelvic cysts as seen on previous CT. No hydronephrosis or solid mass. Left Kidney: Length: 12.4 cm. Normal parenchymal echotexture and thickness. Parapelvic cysts as seen on previous CT. No hydronephrosis or solid mass. Bladder: Bladder is incompletely distended. No evidence of bladder wall thickening or filling defect. IMPRESSION: Parapelvic cysts in the kidneys.  No hydronephrosis.  No solid mass. Electronically Signed   By: Lucienne Capers M.D.   On: 07/22/2017 03:46    Procedures Procedures (including critical care time)  Medications Ordered in ED Medications  oxyCODONE-acetaminophen (PERCOCET/ROXICET) 5-325 MG per tablet 1 tablet (1 tablet Oral Given 07/22/17 0043)  morphine 4 MG/ML injection 4 mg (4 mg Intravenous Given 07/22/17 0150)  ondansetron (ZOFRAN) injection 4 mg (4 mg Intravenous Given 07/22/17 0150)  morphine 4 MG/ML injection 4 mg (4 mg Intravenous Given 07/22/17 0411)  oxyCODONE-acetaminophen (PERCOCET/ROXICET) 5-325 MG per tablet 1 tablet (1 tablet Oral Given 07/22/17 0411)     Initial Impression / Assessment and Plan / ED Course  I have reviewed the triage vital signs and the nursing notes.  Pertinent labs &  imaging results that were available during my care of the patient were reviewed by me and considered in my medical decision making (see chart for details).     Patient with history of kidney stones presents with left flank pain consistent with prior stents. She is nontoxic. Afebrile. Vital signs reassuring. Exam is fairly benign. Would like to avoid CT if possible. KUB and ultrasound obtained. Patient treated with pain and nausea  medication as well as fluids. Lab work obtained and at the patient's baseline. Acute abdominal series does not show any large radiopaque obstructing stones. Additionally, ultrasound without hydronephrosis or solid mass. This is reassuring. Patient did have some improvement of symptoms with supportive measures in the ED. Given history, will treat as a presumed kidney stone that cannot be visualized. Will discharge with a short course of Percocet and Zofran. Follow-up with Dr. Alyson Ingles recommended.  After history, exam, and medical workup I feel the patient has been appropriately medically screened and is safe for discharge home. Pertinent diagnoses were discussed with the patient. Patient was given return precautions.   Final Clinical Impressions(s) / ED Diagnoses   Final diagnoses:  Left flank pain    New Prescriptions New Prescriptions   ONDANSETRON (ZOFRAN ODT) 4 MG DISINTEGRATING TABLET    Take 1 tablet (4 mg total) by mouth every 8 (eight) hours as needed for nausea or vomiting.   OXYCODONE-ACETAMINOPHEN (PERCOCET/ROXICET) 5-325 MG TABLET    Take 1 tablet by mouth every 6 (six) hours as needed for severe pain.     Merryl Hacker, MD 07/22/17 [phone]

## 2017-07-22 NOTE — ED Notes (Signed)
No respiratory or acute distress noted alert and oriented x 3 call light in reach family at bedside. 

## 2017-07-22 NOTE — ED Triage Notes (Signed)
Pt comes in with left flank pain with possible kidney stone.  Pt has had several surgeries for kidney stones in the past.  A&O x4.  Ambulatory with assistance.  Endorses nausea. States decrease in urine output.

## 2017-07-22 NOTE — ED Notes (Signed)
No respiratory or acute distress noted alert and oriented x 3 no reaction to medication noted call light in reach family at bedside.

## 2017-07-22 NOTE — Discharge Instructions (Signed)
You were seen today for left flank pain. Your workup is reassuring. There is no large stone noted on your x-ray and your ultrasound is reassuring. However, given your history, you will be treated as if this is a kidney stone. You'll be discharged with a short course of pain medication and nausea medication. Follow-up with your urologist. If you develop fevers or worsening symptoms she should be reevaluated.

## 2017-07-22 NOTE — ED Notes (Signed)
No respiratory or acute distress noted alert and oriented x 3 no reaction to medication noted visitors at bedside.

## 2017-07-26 ENCOUNTER — Ambulatory Visit: Payer: 59 | Admitting: Internal Medicine

## 2017-08-03 DIAGNOSIS — M1712 Unilateral primary osteoarthritis, left knee: Secondary | ICD-10-CM | POA: Diagnosis not present

## 2017-08-07 ENCOUNTER — Telehealth: Payer: Self-pay | Admitting: Internal Medicine

## 2017-08-07 NOTE — Telephone Encounter (Signed)
Lmom for pt to call office back to make a surgical clearance appt per Dr. Raliegh Ip

## 2017-08-08 NOTE — Telephone Encounter (Signed)
Pt has a 6 month f/u OV on 08/27/17 and would like to know if she would need an additional OV for her surgical clearance.  Please advise

## 2017-08-09 DIAGNOSIS — R8271 Bacteriuria: Secondary | ICD-10-CM | POA: Diagnosis not present

## 2017-08-09 DIAGNOSIS — N201 Calculus of ureter: Secondary | ICD-10-CM | POA: Diagnosis not present

## 2017-08-09 DIAGNOSIS — N390 Urinary tract infection, site not specified: Secondary | ICD-10-CM | POA: Diagnosis not present

## 2017-08-09 DIAGNOSIS — B9620 Unspecified Escherichia coli [E. coli] as the cause of diseases classified elsewhere: Secondary | ICD-10-CM | POA: Diagnosis not present

## 2017-08-09 NOTE — Telephone Encounter (Signed)
A single follow-up will be fine

## 2017-08-14 ENCOUNTER — Other Ambulatory Visit: Payer: Self-pay | Admitting: Urology

## 2017-08-15 ENCOUNTER — Encounter (HOSPITAL_BASED_OUTPATIENT_CLINIC_OR_DEPARTMENT_OTHER): Payer: Self-pay | Admitting: *Deleted

## 2017-08-15 NOTE — Progress Notes (Signed)
No food after midnight clear liquids from midnight until 900 am, take atenolol prn, flexeril prn, dilaudid prn, zofran prn, synthroid prn, tamsulosin, use albuterol inhaler prn and bring, stop phetermine now. Husband roy driver,  Needs hemaglobin.  Uses left knee brace and right crutch all the time

## 2017-08-19 ENCOUNTER — Ambulatory Visit (HOSPITAL_BASED_OUTPATIENT_CLINIC_OR_DEPARTMENT_OTHER)
Admission: RE | Admit: 2017-08-19 | Discharge: 2017-08-19 | Disposition: A | Discharge status: Home / Self Care | Payer: 59 | Source: Ambulatory Visit | Attending: Urology | Admitting: Urology

## 2017-08-19 ENCOUNTER — Ambulatory Visit (HOSPITAL_BASED_OUTPATIENT_CLINIC_OR_DEPARTMENT_OTHER): Payer: 59 | Admitting: Anesthesiology

## 2017-08-19 ENCOUNTER — Other Ambulatory Visit: Payer: Self-pay | Admitting: Urology

## 2017-08-19 ENCOUNTER — Encounter (HOSPITAL_BASED_OUTPATIENT_CLINIC_OR_DEPARTMENT_OTHER): Payer: Self-pay

## 2017-08-19 ENCOUNTER — Encounter (HOSPITAL_BASED_OUTPATIENT_CLINIC_OR_DEPARTMENT_OTHER)
Admission: RE | Disposition: A | Discharge status: Home / Self Care | Payer: Self-pay | Source: Ambulatory Visit | Attending: Urology

## 2017-08-19 DIAGNOSIS — I10 Essential (primary) hypertension: Secondary | ICD-10-CM | POA: Insufficient documentation

## 2017-08-19 DIAGNOSIS — Z880 Allergy status to penicillin: Secondary | ICD-10-CM | POA: Insufficient documentation

## 2017-08-19 DIAGNOSIS — R51 Headache: Secondary | ICD-10-CM | POA: Diagnosis not present

## 2017-08-19 DIAGNOSIS — N200 Calculus of kidney: Secondary | ICD-10-CM | POA: Insufficient documentation

## 2017-08-19 DIAGNOSIS — Z87442 Personal history of urinary calculi: Secondary | ICD-10-CM | POA: Insufficient documentation

## 2017-08-19 DIAGNOSIS — Z881 Allergy status to other antibiotic agents status: Secondary | ICD-10-CM | POA: Insufficient documentation

## 2017-08-19 DIAGNOSIS — E039 Hypothyroidism, unspecified: Secondary | ICD-10-CM | POA: Insufficient documentation

## 2017-08-19 DIAGNOSIS — Z79899 Other long term (current) drug therapy: Secondary | ICD-10-CM | POA: Diagnosis not present

## 2017-08-19 DIAGNOSIS — Z882 Allergy status to sulfonamides status: Secondary | ICD-10-CM | POA: Insufficient documentation

## 2017-08-19 DIAGNOSIS — N202 Calculus of kidney with calculus of ureter: Secondary | ICD-10-CM | POA: Diagnosis not present

## 2017-08-19 HISTORY — PX: CYSTOSCOPY WITH RETROGRADE PYELOGRAM, URETEROSCOPY AND STENT PLACEMENT: SHX5789

## 2017-08-19 LAB — POCT HEMOGLOBIN-HEMACUE: Hemoglobin: 13.9 g/dL (ref 12.0–15.0)

## 2017-08-19 SURGERY — CYSTOURETEROSCOPY, WITH RETROGRADE PYELOGRAM AND STENT INSERTION
Anesthesia: General | Laterality: Right

## 2017-08-19 MED ORDER — IOHEXOL 300 MG/ML  SOLN
INTRAMUSCULAR | Status: DC | PRN
  Administered 2017-08-19: 4 mL via INTRAVENOUS

## 2017-08-19 MED ORDER — EPHEDRINE 5 MG/ML INJ
INTRAVENOUS | Status: AC
  Filled 2017-08-19: qty 10

## 2017-08-19 MED ORDER — FENTANYL CITRATE (PF) 100 MCG/2ML IJ SOLN
INTRAMUSCULAR | Status: AC
  Filled 2017-08-19: qty 2

## 2017-08-19 MED ORDER — HYDROMORPHONE HCL 1 MG/ML IJ SOLN
0.2500 mg | INTRAMUSCULAR | Status: DC | PRN
  Administered 2017-08-19: 0.25 mg via INTRAVENOUS
  Filled 2017-08-19: qty 0.5

## 2017-08-19 MED ORDER — LIDOCAINE 2% (20 MG/ML) 5 ML SYRINGE
INTRAMUSCULAR | Status: AC
  Filled 2017-08-19: qty 5

## 2017-08-19 MED ORDER — HYDROMORPHONE HCL-NACL 0.5-0.9 MG/ML-% IV SOSY
PREFILLED_SYRINGE | INTRAVENOUS | Status: AC
  Filled 2017-08-19: qty 1

## 2017-08-19 MED ORDER — LIDOCAINE 2% (20 MG/ML) 5 ML SYRINGE
INTRAMUSCULAR | Status: DC | PRN
Start: 2017-08-19 — End: 2017-08-19
  Administered 2017-08-19: 80 mg via INTRAVENOUS

## 2017-08-19 MED ORDER — OXYCODONE HCL 10 MG PO TABS: 10.0000 mg | ORAL_TABLET | Freq: Once | ORAL | 0 refills | Status: DC | PRN

## 2017-08-19 MED ORDER — EPHEDRINE SULFATE 50 MG/ML IJ SOLN
INTRAMUSCULAR | Status: DC | PRN
  Administered 2017-08-19 (×3): 10 mg via INTRAVENOUS

## 2017-08-19 MED ORDER — PROMETHAZINE HCL 25 MG/ML IJ SOLN
INTRAMUSCULAR | Status: AC
  Filled 2017-08-19: qty 1

## 2017-08-19 MED ORDER — DEXAMETHASONE SODIUM PHOSPHATE 10 MG/ML IJ SOLN
INTRAMUSCULAR | Status: DC | PRN
  Administered 2017-08-19: 10 mg via INTRAVENOUS

## 2017-08-19 MED ORDER — PROMETHAZINE HCL 25 MG/ML IJ SOLN
INTRAMUSCULAR | Status: DC | PRN
  Administered 2017-08-19: 5 mg via INTRAVENOUS

## 2017-08-19 MED ORDER — PROPOFOL 10 MG/ML IV BOLUS
INTRAVENOUS | Status: AC
  Filled 2017-08-19: qty 20

## 2017-08-19 MED ORDER — LACTATED RINGERS IV SOLN
INTRAVENOUS | Status: DC
  Administered 2017-08-19: 15:00:00 via INTRAVENOUS
  Filled 2017-08-19: qty 1000

## 2017-08-19 MED ORDER — OXYCODONE HCL 5 MG PO TABS
5.0000 mg | ORAL_TABLET | Freq: Once | ORAL | Status: DC | PRN
  Filled 2017-08-19: qty 1

## 2017-08-19 MED ORDER — MIDAZOLAM HCL 2 MG/2ML IJ SOLN
INTRAMUSCULAR | Status: DC | PRN
  Administered 2017-08-19: 2 mg via INTRAVENOUS

## 2017-08-19 MED ORDER — ONDANSETRON HCL 4 MG/2ML IJ SOLN
INTRAMUSCULAR | Status: DC | PRN
  Administered 2017-08-19: 4 mg via INTRAVENOUS

## 2017-08-19 MED ORDER — ONDANSETRON 4 MG PO TBDP
4.0000 mg | ORAL_TABLET | Freq: Three times a day (TID) | ORAL | 0 refills | Status: DC | PRN
Start: 2017-08-19 — End: 2020-03-26

## 2017-08-19 MED ORDER — OXYCODONE HCL 5 MG/5ML PO SOLN
5.0000 mg | Freq: Once | ORAL | Status: DC | PRN
Start: 2017-08-19 — End: 2017-08-19
  Filled 2017-08-19: qty 5

## 2017-08-19 MED ORDER — PROPOFOL 10 MG/ML IV BOLUS
INTRAVENOUS | Status: DC | PRN
  Administered 2017-08-19: 200 mg via INTRAVENOUS

## 2017-08-19 MED ORDER — MIDAZOLAM HCL 2 MG/2ML IJ SOLN
INTRAMUSCULAR | Status: AC
  Filled 2017-08-19: qty 2

## 2017-08-19 MED ORDER — PROMETHAZINE HCL 25 MG/ML IJ SOLN
6.2500 mg | INTRAMUSCULAR | Status: DC | PRN
  Administered 2017-08-19: 6.25 mg via INTRAVENOUS
  Filled 2017-08-19: qty 1

## 2017-08-19 MED ORDER — KETOROLAC TROMETHAMINE 30 MG/ML IJ SOLN
INTRAMUSCULAR | Status: DC | PRN
  Administered 2017-08-19: 30 mg via INTRAVENOUS

## 2017-08-19 MED ORDER — FENTANYL CITRATE (PF) 100 MCG/2ML IJ SOLN
INTRAMUSCULAR | Status: DC | PRN
  Administered 2017-08-19: 50 ug via INTRAVENOUS

## 2017-08-19 SURGICAL SUPPLY — 31 items
BAG DRAIN URO-CYSTO SKYTR STRL (DRAIN) ×2 IMPLANT
BAG DRN UROCATH (DRAIN) ×1
BASKET DAKOTA 1.9FR 11X120 (BASKET) IMPLANT
BASKET STONE 1.7 NGAGE (UROLOGICAL SUPPLIES) ×1 IMPLANT
CATH INTERMIT  6FR 70CM (CATHETERS) IMPLANT
CATH URET 5FR 28IN OPEN ENDED (CATHETERS) ×2 IMPLANT
CLOTH BEACON ORANGE TIMEOUT ST (SAFETY) ×2 IMPLANT
EVACUATOR MICROVAS BLADDER (UROLOGICAL SUPPLIES) IMPLANT
FIBER LASER FLEXIVA 1000 (UROLOGICAL SUPPLIES) IMPLANT
FIBER LASER FLEXIVA 200 (UROLOGICAL SUPPLIES) IMPLANT
FIBER LASER FLEXIVA 365 (UROLOGICAL SUPPLIES) IMPLANT
FIBER LASER FLEXIVA 550 (UROLOGICAL SUPPLIES) IMPLANT
FIBER LASER TRAC TIP (UROLOGICAL SUPPLIES) IMPLANT
GLOVE BIO SURGEON STRL SZ8 (GLOVE) ×1 IMPLANT
GLOVE SURG SS PI 7.0 STRL IVOR (GLOVE) ×6 IMPLANT
GLOVE SURG SS PI 7.5 STRL IVOR (GLOVE) ×6 IMPLANT
GLOVE SURG SS PI 8.0 STRL IVOR (GLOVE) ×2 IMPLANT
GOWN STRL REUS W/TWL LRG LVL3 (GOWN DISPOSABLE) ×2 IMPLANT
GOWN STRL REUS W/TWL XL LVL3 (GOWN DISPOSABLE) ×2 IMPLANT
GUIDEWIRE ANG ZIPWIRE 038X150 (WIRE) ×2 IMPLANT
GUIDEWIRE STR DUAL SENSOR (WIRE) IMPLANT
INFUSOR MANOMETER BAG 3000ML (MISCELLANEOUS) ×2 IMPLANT
IV NS IRRIG 3000ML ARTHROMATIC (IV SOLUTION) ×2 IMPLANT
KIT RM TURNOVER CYSTO AR (KITS) ×2 IMPLANT
MANIFOLD NEPTUNE II (INSTRUMENTS) ×2 IMPLANT
NS IRRIG 500ML POUR BTL (IV SOLUTION) ×2 IMPLANT
PACK CYSTO (CUSTOM PROCEDURE TRAY) ×2 IMPLANT
STENT URET 6FRX26 CONTOUR (STENTS) IMPLANT
SYRINGE 10CC LL (SYRINGE) ×2 IMPLANT
TUBE CONNECTING 12X1/4 (SUCTIONS) IMPLANT
TUBE FEEDING 8FR 16IN STR KANG (MISCELLANEOUS) IMPLANT

## 2017-08-19 NOTE — H&P (Signed)
Urology Admission H&P  Chief Complaint: right flank pain  History of Present Illness: Katelyn [name] is a 61yo with a hx of nephrolithiasis and right ureteral calculus who has failed to pass her right ureteral calculus  Past Medical History:  Diagnosis Date  . Anemia   . Headache(784.0)    cluster headaches, migraines- states is taking Atenolol at present as directed per Dr Lacinda Axon  to prevent migraine from increased stress-  states if on medication for several weeks that heart rate will drop to 40's  . History of kidney stones   . HYPOTHYROIDISM 05/27/2007  . Pneumonia    post op x 2 last yrs ago  . PONV (postoperative nausea and vomiting)    likes phenergan with anesthesia  . WEIGHT GAIN 07/15/2009   Past Surgical History:  Procedure Laterality Date  . ABDOMINAL HYSTERECTOMY    . APPENDECTOMY    . CYSTOSCOPY WITH RETROGRADE PYELOGRAM, URETEROSCOPY AND STENT PLACEMENT Right 04/26/2017   Procedure: CYSTOSCOPY WITH RETROGRADE PYELOGRAM, URETEROSCOPY AND STENT PLACEMENT;  Surgeon: Cleon Gustin, MD;  Location: [facility];  Service: Urology;  Laterality: Right;  . DIAGNOSTIC LAPAROSCOPY    . FOOT SURGERY  2007   tumor removed from right foot/   . HOLMIUM LASER APPLICATION Right 1/44/3154   Procedure: HOLMIUM LASER APPLICATION;  Surgeon: Cleon Gustin, MD;  Location: [facility];  Service: Urology;  Laterality: Right;  . KNEE ARTHROSCOPY Left    x 3  . LAPAROTOMY  10/30/2011   Procedure: EXPLORATORY LAPAROTOMY;  Surgeon: Janie Morning, MD;  Location: [facility];  Service: Gynecology;  Laterality: N/A;  . mortons neuroma removed from foot    . SALPINGOOPHORECTOMY  10/30/2011   Procedure: SALPINGO OOPHERECTOMY;  Surgeon: Janie Morning, MD;  Location: [facility];  Service: Gynecology;  Laterality: Right;  RIGHT  . TONSILLECTOMY    . TUBAL LIGATION    . TYMPANOSTOMY TUBE PLACEMENT      Home Medications:  Prescriptions Prior to Admission  Medication Sig Dispense Refill Last Dose  .  atenolol (TENORMIN) 25 MG tablet TAKE 1 TABLET (25 MG TOTAL) BY MOUTH EVERY MORNING. FOR MIGRAINES (Patient taking differently: Take 25 mg by mouth daily as needed. For migraines) 90 tablet 1 08/19/2017 at 0700  . Biotin 1 MG CAPS Take 1 mg by mouth daily.   Past Week at Unknown time  . cephALEXin (KEFLEX) 500 MG capsule Take 500 mg by mouth 3 (three) times daily. Started 08-10-18   08/19/2017 at 0700  . cholecalciferol (VITAMIN D) 1000 UNITS tablet Take 1,000 Units by mouth daily.    Past Week at Unknown time  . Cranberry 1000 MG CAPS Take 1,000 mg by mouth daily.   Past Week at Unknown time  . Cyanocobalamin (VITAMIN B-12) 2500 MCG SUBL Place 2,500 mcg under the tongue daily.    Past Week at Unknown time  . cyclobenzaprine (FLEXERIL) 10 MG tablet Take 10 mg by mouth every 8 (eight) hours as needed for muscle spasms.    08/18/2017 at Unknown time  . phentermine 30 MG capsule TAKE ONE CAPSULE BY MOUTH DAILY 90 capsule 0 Past Week at 800  . SYNTHROID 125 MCG tablet TAKE 1 TABLET (125 MCG TOTAL) BY MOUTH DAILY. 90 tablet 1 08/18/2017 at Unknown time  . tamsulosin (FLOMAX) 0.4 MG CAPS capsule Take 0.4 mg by mouth daily.   08/18/2017 at Unknown time  . albuterol (PROVENTIL HFA;VENTOLIN HFA) 108 (90 Base) MCG/ACT inhaler Inhale 2 puffs into the lungs every 6 (  six) hours as needed for wheezing or shortness of breath. 1 Inhaler 0 More than a month at Unknown time  . HYDROmorphone (DILAUDID) 2 MG tablet Take 1 tablet (2 mg total) by mouth every 4 (four) hours as needed for severe pain. 30 tablet 0 08/17/2017  . ondansetron (ZOFRAN ODT) 4 MG disintegrating tablet Take 1 tablet (4 mg total) by mouth every 8 (eight) hours as needed for nausea or vomiting. 20 tablet 0 More than a month at Unknown time  . oxyCODONE-acetaminophen (PERCOCET/ROXICET) 5-325 MG tablet Take 1 tablet by mouth every 6 (six) hours as needed for severe pain. 10 tablet 0 More than a month at Unknown time  . predniSONE (DELTASONE) 10 MG tablet Take  10 mg by mouth daily.   Unknown at Unknown time   Allergies:  Allergies  Allergen Reactions  . Adhesive [Tape] Swelling and Other (See Comments)    Reaction:  Eats in skin  . Ciprofloxacin Hives and Shortness Of Breath  . Dimethicone Hives and Shortness Of Breath  . Erythromycin Hives and Shortness Of Breath  . Latex Hives and Shortness Of Breath  . Other Hives, Shortness Of Breath and Other (See Comments)    Pt is allergic to nitrile gloves.   . Penicillins Hives, Shortness Of Breath and Other (See Comments)    Has patient had a PCN reaction causing immediate rash, facial/tongue/throat swelling, SOB or lightheadedness with hypotension: Yes Has patient had a PCN reaction causing severe rash involving mucus membranes or skin necrosis: No Has patient had a PCN reaction that required hospitalization No Has patient had a PCN reaction occurring within the last 10 years: No If all of the above answers are "NO", then may proceed with Cephalosporin use.  . Red Dye Hives and Shortness Of Breath  . Shellfish Allergy Hives and Shortness Of Breath  . Sulfonamide Derivatives Hives and Shortness Of Breath  . Alcohol-Sulfur [Sulfur] Swelling and Other (See Comments)    Reaction:  Localized swelling  With alcohol   . Aspirin Nausea And Vomiting  . Doxycycline Nausea And Vomiting  . Ceftin [Cefuroxime] Rash    Family History  Problem Relation Age of Onset  . Alzheimer's disease Mother   . Cervical cancer Mother   . Diabetes Sister   . Heart attack Brother   . Hypertension Neg Hx        family hx  . Heart disease Neg Hx        family hx   Social History:  reports that she has never smoked. She has never used smokeless tobacco. She reports that she does not drink alcohol or use drugs.  Review of Systems  Genitourinary: Positive for flank pain and urgency.  All other systems reviewed and are negative.   Physical Exam:  Vital signs in last 24 hours: Temp:  [98.9 F (37.2 C)] 98.9 F  (37.2 C) (10/08 1344) Resp:  [16] 16 (10/08 1344) BP: (152)/(93) 152/93 (10/08 1344) SpO2:  [96 %] 96 % (10/08 1344) Weight:  [80.7 kg (178 lb)] 80.7 kg (178 lb) (10/08 1344) Physical Exam  Constitutional: She is oriented to person, place, and time. She appears well-developed and well-nourished.  HENT:  Head: Normocephalic and atraumatic.  Eyes: Pupils are equal, round, and reactive to light. EOM are normal.  Neck: Normal range of motion. No thyromegaly present.  Cardiovascular: Normal rate and regular rhythm.   Respiratory: Effort normal. No respiratory distress.  GI: Soft. She exhibits no distension.  Musculoskeletal: Normal range  of motion. She exhibits no edema.  Neurological: She is alert and oriented to person, place, and time.  Skin: Skin is warm and dry.  Psychiatric: She has a normal mood and affect. Her behavior is normal. Judgment and thought content normal.    Laboratory Data:  Results for orders placed or performed during the hospital encounter of 08/19/17 (from the past 24 hour(s))  Hemoglobin-hemacue, POC     Status: None   Collection Time: 08/19/17  2:08 PM  Result Value Ref Range   Hemoglobin 13.9 12.0 - 15.0 g/dL   No results found for this or any previous visit (from the past 240 hour(s)). Creatinine: No results for input(s): CREATININE in the last 168 hours. Baseline Creatinine: unknwon  Impression/Assessment:  61yo with right ureteral calculi  Plan:  The risks/benefits/alternatives to R URS was explained to the patient and she understands and wishes to proceed with surgery  Katelyn [name] 08/19/2017, 3:09 PM

## 2017-08-19 NOTE — Progress Notes (Signed)
Waited to get family per patient's request.

## 2017-08-19 NOTE — Transfer of Care (Signed)
Immediate Anesthesia Transfer of Care Note  Patient: Katelyn [name]  Procedure(s) Performed: CYSTOSCOPY WITH RETROGRADE PYELOGRAM, URETEROSCOPY (Right )  Patient Location: PACU  Anesthesia Type:General  Level of Consciousness: awake, alert , oriented and patient cooperative  Airway & Oxygen Therapy: Patient Spontanous Breathing and Patient connected to nasal cannula oxygen  Post-op Assessment: Report given to RN and Post -op Vital signs reviewed and stable  Post vital signs: Reviewed and stable  Last Vitals:  Vitals:   08/19/17 1344  BP: (!) 152/93  Resp: 16  Temp: 37.2 C  SpO2: 96%    Last Pain:  Vitals:   08/19/17 1344  TempSrc: Oral      Patients Stated Pain Goal: 5 (59/92/34 1443)  Complications: No apparent anesthesia complications

## 2017-08-19 NOTE — Anesthesia Procedure Notes (Signed)
Procedure Name: LMA Insertion Date/Time: 08/19/2017 3:38 PM Performed by: Wanita Chamberlain Pre-anesthesia Checklist: Patient identified, Timeout performed, Emergency Drugs available, Suction available and Patient being monitored Patient Re-evaluated:Patient Re-evaluated prior to induction Oxygen Delivery Method: Circle system utilized Preoxygenation: Pre-oxygenation with 100% oxygen Induction Type: IV induction Ventilation: Mask ventilation without difficulty LMA: LMA inserted LMA Size: 4.0 Number of attempts: 1 Placement Confirmation: positive ETCO2 and breath sounds checked- equal and bilateral Tube secured with: Tape Dental Injury: Teeth and Oropharynx as per pre-operative assessment

## 2017-08-19 NOTE — Anesthesia Postprocedure Evaluation (Signed)
Anesthesia Post Note  Patient: Katelyn [name]  Procedure(s) Performed: CYSTOSCOPY WITH RETROGRADE PYELOGRAM, URETEROSCOPY (Right )     Patient location during evaluation: PACU Anesthesia Type: General Level of consciousness: awake and alert Pain management: pain level controlled Vital Signs Assessment: post-procedure vital signs reviewed and stable Respiratory status: spontaneous breathing, nonlabored ventilation, respiratory function stable and patient connected to nasal cannula oxygen Cardiovascular status: blood pressure returned to baseline and stable Postop Assessment: no apparent nausea or vomiting Anesthetic complications: no    Last Vitals:  Vitals:   08/19/17 1705 08/19/17 1708  BP:    Pulse: (!) 59 64  Resp: 15 15  Temp:    SpO2: 99% 99%    Last Pain:  Vitals:   08/19/17 1708  TempSrc:   PainSc: [place]

## 2017-08-19 NOTE — Anesthesia Preprocedure Evaluation (Signed)
Anesthesia Evaluation  Patient identified by MRN, date of birth, ID band Patient awake    Reviewed: Allergy & Precautions, H&P , Patient's Chart, lab work & pertinent test results, reviewed documented beta blocker date and time   History of Anesthesia Complications (+) PONV  Airway Mallampati: II  TM Distance: >3 FB Neck ROM: full    Dental no notable dental hx.    Pulmonary    Pulmonary exam normal breath sounds clear to auscultation       Cardiovascular hypertension, Pt. on medications  Rhythm:regular Rate:Normal     Neuro/Psych  Headaches,    GI/Hepatic   Endo/Other  Hypothyroidism   Renal/GU      Musculoskeletal   Abdominal   Peds  Hematology   Anesthesia Other Findings Occasional inhaler use; breathing well today  Lungs clear fearfull of all adhesives and alcohol hand cleaner    Reproductive/Obstetrics                             Anesthesia Physical  Anesthesia Plan  ASA: II  Anesthesia Plan: General   Post-op Pain Management:    Induction:   PONV Risk Score and Plan: 3 and Ondansetron, Dexamethasone and Midazolam  Airway Management Planned: LMA  Additional Equipment:   Intra-op Plan:   Post-operative Plan:   Informed Consent: I have reviewed the patients History and Physical, chart, labs and discussed the procedure including the risks, benefits and alternatives for the proposed anesthesia with the patient or authorized representative who has indicated his/her understanding and acceptance.   Dental Advisory Given  Plan Discussed with: CRNA and Surgeon  Anesthesia Plan Comments: (  )        Anesthesia Quick Evaluation

## 2017-08-19 NOTE — Op Note (Signed)
Preoperative diagnosis: Right renal stone  Postoperative diagnosis: Same  Procedure: 1 cystoscopy 2 right retrograde pyelography 3.  Intraoperative fluoroscopy, under one hour, with interpretation 4.  Right ureteroscopic stone manipulation with basket extraction  Attending: Rosie Fate  Anesthesia: General  Estimated blood loss: None  Drains: none  Specimens: stone for analysis  Antibiotics: ancef  Findings: right mid pole calculus. No calculi encountered in the ureter. No hydronephrosis.  Indications: Patient is a 61 year old female with a history of ureteral stone and who has failed medical expulsive therapy.  After discussing treatment options, she decided proceed with right ureteroscopic stone manipulation.  Procedure her in detail: The patient was brought to the operating room and a brief timeout was done to ensure correct patient, correct procedure, correct site.  General anesthesia was administered patient was placed in dorsal lithotomy position.  Her genitalia was then prepped and draped in usual sterile fashion.  A rigid 82 French cystoscope was passed in the urethra and the bladder.  Bladder was inspected free masses or lesions.  the right ureteral orifices were in the normal orthotopic locations.  a 6 french ureteral catheter was then instilled into the right ureter orifice.  a gentle retrograde was obtained and findings noted above.  we then placed a zip wire through the ureteral catheter and advanced up to the renal pelvis.  we then removed the cystoscope and cannulated the right ureteral orifice with a semirigid ureteroscope. We encountered no stone in the ureter. Once we reached the UPJ we advanced a sensor wire into the renal pelvis. We removed the scope and advanced a flexible ureteroscope over the sensor wire and up to the renal pelvis. We encountered the stone in the mid pole. the stone was then removed with a engage basket.   Since this was an uncomplicated  ureteroscopy a stent was not placed.   the bladder was then drained and this concluded the procedure which was well tolerated by patient.  Complications: None  Condition: Stable, extubated, transferred to PACU  Plan: Patient is to be discharged home as to follow-up in one week

## 2017-08-19 NOTE — Discharge Instructions (Signed)
Ureteroscopy, Care After This sheet gives you information about how to care for yourself after your procedure. Your health care provider may also give you more specific instructions. If you have problems or questions, contact your health care provider. What can I expect after the procedure? After the procedure, it is common to have:  A burning sensation when you urinate.  Blood in your urine.  Mild discomfort in the bladder area or kidney area when urinating.  Needing to urinate more often or urgently.  Follow these instructions at home: Medicines  Take over-the-counter and prescription medicines only as told by your health care provider.  If you were prescribed an antibiotic medicine, take it as told by your health care provider. Do not stop taking the antibiotic even if you start to feel better. General instructions   Donot drive for 24 hours if you were given a medicine to help you relax (sedative) during your procedure.  To relieve burning, try taking a warm bath or holding a warm washcloth over your groin.  Drink enough fluid to keep your urine clear or pale yellow. ? Drink two 8-ounce glasses of water every hour for the first 2 hours after you get home. ? Continue to drink water often at home.  You can eat what you usually do.  Keep all follow-up visits as told by your health care provider. This is important. ? If you had a tube placed to keep urine flowing (ureteral stent), ask your health care provider when you need to return to have it removed. Contact a health care provider if:  You have chills or a fever.  You have burning pain for longer than 24 hours after the procedure.  You have blood in your urine for longer than 24 hours after the procedure. Get help right away if:  You have large amounts of blood in your urine.  You have blood clots in your urine.  You have very bad pain.  You have chest pain or trouble breathing.  You are unable to urinate and  you have the feeling of a full bladder. This information is not intended to replace advice given to you by your health care provider. Make sure you discuss any questions you have with your health care provider. Document Released: 11/03/2013 Document Revised: 08/14/2016 Document Reviewed: 08/10/2016 Elsevier Interactive Patient Education  2018 [place] not take any nonsteroidal anti inflammatories (advil, motrin, ibuprofen, aleve) until after 10:00 pm today.    Post Anesthesia Home Care Instructions  Activity: Get plenty of rest for the remainder of the day. A responsible individual must stay with you for 24 hours following the procedure.  For the next 24 hours, DO NOT: -Drive a car -Paediatric nurse -Drink alcoholic beverages -Take any medication unless instructed by your physician -Make any legal decisions or sign important papers.  Meals: Start with liquid foods such as gelatin or soup. Progress to regular foods as tolerated. Avoid greasy, spicy, heavy foods. If nausea and/or vomiting occur, drink only clear liquids until the nausea and/or vomiting subsides. Call your physician if vomiting continues.  Special Instructions/Symptoms: Your throat may feel dry or sore from the anesthesia or the breathing tube placed in your throat during surgery. If this causes discomfort, gargle with warm salt water. The discomfort should disappear within 24 hours.  If you had a scopolamine patch placed behind your ear for the management of post- operative nausea and/or vomiting:  1. The medication in the patch is effective for 72  hours, after which it should be removed.  Wrap patch in a tissue and discard in the trash. Wash hands thoroughly with soap and water. 2. You may remove the patch earlier than 72 hours if you experience unpleasant side effects which may include dry mouth, dizziness or visual disturbances. 3. Avoid touching the patch. Wash your hands with soap and water after contact  with the patch.

## 2017-08-20 ENCOUNTER — Encounter (HOSPITAL_BASED_OUTPATIENT_CLINIC_OR_DEPARTMENT_OTHER): Payer: Self-pay | Admitting: Urology

## 2017-08-27 ENCOUNTER — Encounter: Payer: Self-pay | Admitting: Internal Medicine

## 2017-08-27 ENCOUNTER — Ambulatory Visit (INDEPENDENT_AMBULATORY_CARE_PROVIDER_SITE_OTHER): Payer: 59 | Admitting: Internal Medicine

## 2017-08-27 VITALS — BP 128/80 | HR 63 | Temp 98.4°F | Ht 67.0 in | Wt 179.4 lb

## 2017-08-27 DIAGNOSIS — I10 Essential (primary) hypertension: Secondary | ICD-10-CM

## 2017-08-27 DIAGNOSIS — E039 Hypothyroidism, unspecified: Secondary | ICD-10-CM

## 2017-08-27 NOTE — Patient Instructions (Signed)
Limit your sodium (Salt) intake  Please check your blood pressure on a regular basis.  If it is consistently greater than 150/90, please make an office appointment.  Return in 6 months for follow-up   

## 2017-08-27 NOTE — Progress Notes (Signed)
Subjective:    Patient ID: Katelyn [name], female    DOB: 1956/03/03, 61 y.o.   MRN: [id number]  HPI  61 year old patient who is seen today for follow-up and for preoperative evaluation.  She is anticipating left total knee replacement surgery next month. She has had a difficult year with chronic left knee pain.  She underwent an intervention in June of this year without benefit. She has a history of hypothyroidism. She has had at least 2 episodes of symptomatic renal colic requiring interventions. Presently she is doing well except for chronic left knee pain.  She uses crutches.  Past Medical History:  Diagnosis Date  . Anemia   . Headache(784.0)    cluster headaches, migraines- states is taking Atenolol at present as directed per Dr Lacinda Axon  to prevent migraine from increased stress-  states if on medication for several weeks that heart rate will drop to 40's  . History of kidney stones   . HYPOTHYROIDISM 05/27/2007  . Pneumonia    post op x 2 last yrs ago  . PONV (postoperative nausea and vomiting)    likes phenergan with anesthesia  . WEIGHT GAIN 07/15/2009     Social History   Social History  . Marital status: Married    Spouse name: N/A  . Number of children: N/A  . Years of education: N/A   Occupational History  . Not on file.   Social History Main Topics  . Smoking status: Never Smoker  . Smokeless tobacco: Never Used  . Alcohol use No  . Drug use: No  . Sexual activity: Yes   Other Topics Concern  . Not on file   Social History Narrative  . No narrative on file    Past Surgical History:  Procedure Laterality Date  . ABDOMINAL HYSTERECTOMY    . APPENDECTOMY    . CYSTOSCOPY WITH RETROGRADE PYELOGRAM, URETEROSCOPY AND STENT PLACEMENT Right 04/26/2017   Procedure: CYSTOSCOPY WITH RETROGRADE PYELOGRAM, URETEROSCOPY AND STENT PLACEMENT;  Surgeon: Cleon Gustin, MD;  Location: [facility];  Service: Urology;  Laterality: Right;  . CYSTOSCOPY WITH  RETROGRADE PYELOGRAM, URETEROSCOPY AND STENT PLACEMENT Right 08/19/2017   Procedure: CYSTOSCOPY WITH RETROGRADE PYELOGRAM, URETEROSCOPY;  Surgeon: Cleon Gustin, MD;  Location: [facility];  Service: Urology;  Laterality: Right;  . DIAGNOSTIC LAPAROSCOPY    . FOOT SURGERY  2007   tumor removed from right foot/   . HOLMIUM LASER APPLICATION Right 05/27/9677   Procedure: HOLMIUM LASER APPLICATION;  Surgeon: Cleon Gustin, MD;  Location: [facility];  Service: Urology;  Laterality: Right;  . KNEE ARTHROSCOPY Left    x 3  . LAPAROTOMY  10/30/2011   Procedure: EXPLORATORY LAPAROTOMY;  Surgeon: Janie Morning, MD;  Location: [facility];  Service: Gynecology;  Laterality: N/A;  . mortons neuroma removed from foot    . SALPINGOOPHORECTOMY  10/30/2011   Procedure: SALPINGO OOPHERECTOMY;  Surgeon: Janie Morning, MD;  Location: [facility];  Service: Gynecology;  Laterality: Right;  RIGHT  . TONSILLECTOMY    . TUBAL LIGATION    . TYMPANOSTOMY TUBE PLACEMENT      Family History  Problem Relation Age of Onset  . Alzheimer's disease Mother   . Cervical cancer Mother   . Diabetes Sister   . Heart attack Brother   . Hypertension Neg Hx        family hx  . Heart disease Neg Hx        family hx  Allergies  Allergen Reactions  . Adhesive [Tape] Swelling and Other (See Comments)    Reaction:  Eats in skin  . Ciprofloxacin Hives and Shortness Of Breath  . Dimethicone Hives and Shortness Of Breath  . Erythromycin Hives and Shortness Of Breath  . Latex Hives and Shortness Of Breath  . Other Hives, Shortness Of Breath and Other (See Comments)    Pt is allergic to nitrile gloves.   . Penicillins Hives, Shortness Of Breath and Other (See Comments)    Has patient had a PCN reaction causing immediate rash, facial/tongue/throat swelling, SOB or lightheadedness with hypotension: Yes Has patient had a PCN reaction causing severe rash involving mucus membranes or skin necrosis: No Has  patient had a PCN reaction that required hospitalization No Has patient had a PCN reaction occurring within the last 10 years: No If all of the above answers are "NO", then may proceed with Cephalosporin use.  . Red Dye Hives and Shortness Of Breath  . Shellfish Allergy Hives and Shortness Of Breath  . Sulfonamide Derivatives Hives and Shortness Of Breath  . Alcohol-Sulfur [Sulfur] Swelling and Other (See Comments)    Reaction:  Localized swelling  With alcohol   . Aspirin Nausea And Vomiting  . Doxycycline Nausea And Vomiting  . Ceftin [Cefuroxime] Rash    Current Outpatient Prescriptions on File Prior to Visit  Medication Sig Dispense Refill  . albuterol (PROVENTIL HFA;VENTOLIN HFA) 108 (90 Base) MCG/ACT inhaler Inhale 2 puffs into the lungs every 6 (six) hours as needed for wheezing or shortness of breath. 1 Inhaler 0  . atenolol (TENORMIN) 25 MG tablet TAKE 1 TABLET (25 MG TOTAL) BY MOUTH EVERY MORNING. FOR MIGRAINES (Patient taking differently: Take 25 mg by mouth daily as needed. For migraines) 90 tablet 1  . Biotin 1 MG CAPS Take 1 mg by mouth daily.    . cholecalciferol (VITAMIN D) 1000 UNITS tablet Take 1,000 Units by mouth daily.     . Cranberry 1000 MG CAPS Take 1,000 mg by mouth daily.    . Cyanocobalamin (VITAMIN B-12) 2500 MCG SUBL Place 2,500 mcg under the tongue daily.     . cyclobenzaprine (FLEXERIL) 10 MG tablet Take 10 mg by mouth every 8 (eight) hours as needed for muscle spasms.     Marland Kitchen HYDROmorphone (DILAUDID) 2 MG tablet Take 1 tablet (2 mg total) by mouth every 4 (four) hours as needed for severe pain. 30 tablet 0  . ondansetron (ZOFRAN ODT) 4 MG disintegrating tablet Take 1 tablet (4 mg total) by mouth every 8 (eight) hours as needed for nausea or vomiting. 30 tablet 0  . phentermine 30 MG capsule TAKE ONE CAPSULE BY MOUTH DAILY 90 capsule 0  . SYNTHROID 125 MCG tablet TAKE 1 TABLET (125 MCG TOTAL) BY MOUTH DAILY. 90 tablet 1  . tamsulosin (FLOMAX) 0.4 MG CAPS  capsule Take 0.4 mg by mouth daily.     No current facility-administered medications on file prior to visit.     BP 128/80 (BP Location: Left Arm, Patient Position: Sitting, Cuff Size: Normal)   Pulse 63   Temp 98.4 F (36.9 C) (Oral)   Ht 5\' 7"  (1.702 m)   Wt 179 lb 6.4 oz (81.4 kg)   SpO2 (!) 63%   BMI 28.10 kg/m     Review of Systems  Constitutional: Negative.   HENT: Negative for congestion, dental problem, hearing loss, rhinorrhea, sinus pressure, sore throat and tinnitus.   Eyes: Negative for pain, discharge and  visual disturbance.  Respiratory: Negative for cough and shortness of breath.   Cardiovascular: Negative for chest pain, palpitations and leg swelling.  Gastrointestinal: Negative for abdominal distention, abdominal pain, blood in stool, constipation, diarrhea, nausea and vomiting.  Genitourinary: Negative for difficulty urinating, dysuria, flank pain, frequency, hematuria, pelvic pain, urgency, vaginal bleeding, vaginal discharge and vaginal pain.  Musculoskeletal: Positive for arthralgias and gait problem. Negative for joint swelling.  Skin: Negative for rash.  Neurological: Negative for dizziness, syncope, speech difficulty, weakness, numbness and headaches.  Hematological: Negative for adenopathy.  Psychiatric/Behavioral: Negative for agitation, behavioral problems and dysphoric mood. The patient is not nervous/anxious.        Objective:   Physical Exam  Constitutional: She is oriented to person, place, and time. She appears well-developed and well-nourished.  HENT:  Head: Normocephalic.  Right Ear: External ear normal.  Left Ear: External ear normal.  Mouth/Throat: Oropharynx is clear and moist.  Eyes: Pupils are equal, round, and reactive to light. Conjunctivae and EOM are normal.  Neck: Normal range of motion. Neck supple. No thyromegaly present.  Cardiovascular: Normal rate, regular rhythm, normal heart sounds and intact distal pulses.     Pulmonary/Chest: Effort normal and breath sounds normal.  Abdominal: Soft. Bowel sounds are normal. She exhibits no mass. There is no tenderness.  Musculoskeletal: Normal range of motion.  Lymphadenopathy:    She has no cervical adenopathy.  Neurological: She is alert and oriented to person, place, and time.  Skin: Skin is warm and dry. No rash noted.  Psychiatric: She has a normal mood and affect. Her behavior is normal.          Assessment & Plan:   End-stage osteoarthritis, left knee.  Stable for left total knee replacement Renal colic Hypothyroidism.  10 months ago.  TSH was slightly elevated.  We'll recheck today  No medical contraindications for surgery.  Note sent to orthopedic physician for medical clearance Return here in 6 months or as needed  Nyoka Cowden

## 2017-08-28 LAB — TSH: TSH: 12.19 u[IU]/mL — AB (ref 0.35–4.50)

## 2017-08-29 MED ORDER — LEVOTHYROXINE SODIUM 150 MCG PO TABS: 150.0000 ug | ORAL_TABLET | Freq: Every day | ORAL | 3 refills | Status: DC

## 2017-09-05 DIAGNOSIS — R1084 Generalized abdominal pain: Secondary | ICD-10-CM | POA: Diagnosis not present

## 2017-09-05 DIAGNOSIS — N201 Calculus of ureter: Secondary | ICD-10-CM | POA: Diagnosis not present

## 2017-09-19 NOTE — Patient Instructions (Signed)
Katelyn [name]  09/19/2017   Your procedure is scheduled on: 09/25/17    Report to [facility] Main  Entrance Take [place]  elevators to 3rd floor to  [place] at   [place] AM.     Call this number if you have problems the morning of surgery [phone]    Remember: ONLY 1 PERSON MAY GO WITH YOU TO SHORT STAY TO GET  READY MORNING OF YOUR SURGERY.  Do not eat food or drink liquids :After Midnight.     Take these medicines the morning of surgery with A SIP OF WATER: Inhalers as usual and bring, Atenolol ( Tenormin0, synthroid, Myrbetriq, Flomax                                 You may not have any metal on your body including hair pins and              piercings  Do not wear jewelry, make-up, lotions, powders or perfumes, deodorant             Do not wear nail polish.  Do not shave  48 hours prior to surgery.              Do not bring valuables to the hospital. [place].  Contacts, dentures or bridgework may not be worn into surgery.  Leave suitcase in the car. After surgery it may be brought to your room.                 Please read over the following fact sheets you were given: _____________________________________________________________________             [facility] - Preparing for Surgery Before surgery, you can play an important role.  Because skin is not sterile, your skin needs to be as free of germs as possible.  You can reduce the number of germs on your skin by washing with CHG (chlorahexidine gluconate) soap before surgery.  CHG is an antiseptic cleaner which kills germs and bonds with the skin to continue killing germs even after washing. Please DO NOT use if you have an allergy to CHG or antibacterial soaps.  If your skin becomes reddened/irritated stop using the CHG and inform your nurse when you arrive at Short Stay. Do not shave (including legs and underarms) for at least 48 hours  prior to the first CHG shower.  You may shave your face/neck. Please follow these instructions carefully:  1.  Shower with CHG Soap the night before surgery and the  morning of Surgery.  2.  If you choose to wash your hair, wash your hair first as usual with your  normal  shampoo.  3.  After you shampoo, rinse your hair and body thoroughly to remove the  shampoo.                           4.  Use CHG as you would any other liquid soap.  You can apply chg directly  to the skin and wash                       Gently with a scrungie  or clean washcloth.  5.  Apply the CHG Soap to your body ONLY FROM THE NECK DOWN.   Do not use on face/ open                           Wound or open sores. Avoid contact with eyes, ears mouth and genitals (private parts).                       Wash face,  Genitals (private parts) with your normal soap.             6.  Wash thoroughly, paying special attention to the area where your surgery  will be performed.  7.  Thoroughly rinse your body with warm water from the neck down.  8.  DO NOT shower/wash with your normal soap after using and rinsing off  the CHG Soap.                9.  Pat yourself dry with a clean towel.            10.  Wear clean pajamas.            11.  Place clean sheets on your bed the night of your first shower and do not  sleep with pets. Day of Surgery : Do not apply any lotions/deodorants the morning of surgery.  Please wear clean clothes to the hospital/surgery center.  FAILURE TO FOLLOW THESE INSTRUCTIONS MAY RESULT IN THE CANCELLATION OF YOUR SURGERY PATIENT SIGNATURE_________________________________  NURSE SIGNATURE__________________________________  ________________________________________________________________________  WHAT IS A BLOOD TRANSFUSION? Blood Transfusion Information  A transfusion is the replacement of blood or some of its parts. Blood is made up of multiple cells which provide different functions.  Red blood cells carry  oxygen and are used for blood loss replacement.  White blood cells fight against infection.  Platelets control bleeding.  Plasma helps clot blood.  Other blood products are available for specialized needs, such as hemophilia or other clotting disorders. BEFORE THE TRANSFUSION  Who gives blood for transfusions?   Healthy volunteers who are fully evaluated to make sure their blood is safe. This is blood bank blood. Transfusion therapy is the safest it has ever been in the practice of medicine. Before blood is taken from a donor, a complete history is taken to make sure that person has no history of diseases nor engages in risky social behavior (examples are intravenous drug use or sexual activity with multiple partners). The donor's travel history is screened to minimize risk of transmitting infections, such as malaria. The donated blood is tested for signs of infectious diseases, such as HIV and hepatitis. The blood is then tested to be sure it is compatible with you in order to minimize the chance of a transfusion reaction. If you or a relative donates blood, this is often done in anticipation of surgery and is not appropriate for emergency situations. It takes many days to process the donated blood. RISKS AND COMPLICATIONS Although transfusion therapy is very safe and saves many lives, the main dangers of transfusion include:   Getting an infectious disease.  Developing a transfusion reaction. This is an allergic reaction to something in the blood you were given. Every precaution is taken to prevent this. The decision to have a blood transfusion has been considered carefully by your caregiver before blood is given. Blood is not given unless the benefits outweigh the risks. AFTER THE  TRANSFUSION  Right after receiving a blood transfusion, you will usually feel much better and more energetic. This is especially true if your red blood cells have gotten low (anemic). The transfusion raises the  level of the red blood cells which carry oxygen, and this usually causes an energy increase.  The nurse administering the transfusion will monitor you carefully for complications. HOME CARE INSTRUCTIONS  No special instructions are needed after a transfusion. You may find your energy is better. Speak with your caregiver about any limitations on activity for underlying diseases you may have. SEEK MEDICAL CARE IF:   Your condition is not improving after your transfusion.  You develop redness or irritation at the intravenous (IV) site. SEEK IMMEDIATE MEDICAL CARE IF:  Any of the following symptoms occur over the next 12 hours:  Shaking chills.  You have a temperature by mouth above 102 F (38.9 C), not controlled by medicine.  Chest, back, or muscle pain.  People around you feel you are not acting correctly or are confused.  Shortness of breath or difficulty breathing.  Dizziness and fainting.  You get a rash or develop hives.  You have a decrease in urine output.  Your urine turns a dark color or changes to pink, red, or brown. Any of the following symptoms occur over the next 10 days:  You have a temperature by mouth above 102 F (38.9 C), not controlled by medicine.  Shortness of breath.  Weakness after normal activity.  The white part of the eye turns yellow (jaundice).  You have a decrease in the amount of urine or are urinating less often.  Your urine turns a dark color or changes to pink, red, or brown. Document Released: 10/26/2000 Document Revised: 01/21/2012 Document Reviewed: 06/14/2008 Exit[employer] Patient Information 2014 [place].  _______________________________________________________________________  Incentive Spirometer  An incentive spirometer is a tool that can help keep your lungs clear and active. This tool measures how well you are filling your lungs with each breath. Taking long deep breaths may help reverse or decrease the chance of  developing breathing (pulmonary) problems (especially infection) following:  A long period of time when you are unable to move or be active. BEFORE THE PROCEDURE   If the spirometer includes an indicator to show your best effort, your nurse or respiratory therapist will set it to a desired goal.  If possible, sit up straight or lean slightly forward. Try not to slouch.  Hold the incentive spirometer in an upright position. INSTRUCTIONS FOR USE  1. Sit on the edge of your bed if possible, or sit up as far as you can in bed or on a chair. 2. Hold the incentive spirometer in an upright position. 3. Breathe out normally. 4. Place the mouthpiece in your mouth and seal your lips tightly around it. 5. Breathe in slowly and as deeply as possible, raising the piston or the ball toward the top of the column. 6. Hold your breath for 3-5 seconds or for as long as possible. Allow the piston or ball to fall to the bottom of the column. 7. Remove the mouthpiece from your mouth and breathe out normally. 8. Rest for a few seconds and repeat Steps 1 through 7 at least 10 times every 1-2 hours when you are awake. Take your time and take a few normal breaths between deep breaths. 9. The spirometer may include an indicator to show your best effort. Use the indicator as a goal to work toward during each repetition.  10. After each set of 10 deep breaths, practice coughing to be sure your lungs are clear. If you have an incision (the cut made at the time of surgery), support your incision when coughing by placing a pillow or rolled up towels firmly against it. Once you are able to get out of bed, walk around indoors and cough well. You may stop using the incentive spirometer when instructed by your caregiver.  RISKS AND COMPLICATIONS  Take your time so you do not get dizzy or light-headed.  If you are in pain, you may need to take or ask for pain medication before doing incentive spirometry. It is harder to take a  deep breath if you are having pain. AFTER USE  Rest and breathe slowly and easily.  It can be helpful to keep track of a log of your progress. Your caregiver can provide you with a simple table to help with this. If you are using the spirometer at home, follow these instructions: [place] IF:   You are having difficultly using the spirometer.  You have trouble using the spirometer as often as instructed.  Your pain medication is not giving enough relief while using the spirometer.  You develop fever of 100.5 F (38.1 C) or higher. SEEK IMMEDIATE MEDICAL CARE IF:   You cough up bloody sputum that had not been present before.  You develop fever of 102 F (38.9 C) or greater.  You develop worsening pain at or near the incision site. MAKE SURE YOU:   Understand these instructions.  Will watch your condition.  Will get help right away if you are not doing well or get worse. Document Released: 03/11/2007 Document Revised: 01/21/2012 Document Reviewed: 05/12/2007 [facility] Patient Information 2014 [place], [place].   ________________________________________________________________________

## 2017-09-20 ENCOUNTER — Ambulatory Visit (HOSPITAL_COMMUNITY)
Admission: RE | Admit: 2017-09-20 | Discharge: 2017-09-20 | Disposition: A | Discharge status: Home / Self Care | Payer: 59 | Source: Ambulatory Visit | Attending: Surgical | Admitting: Surgical

## 2017-09-20 ENCOUNTER — Encounter (HOSPITAL_COMMUNITY)
Admission: RE | Admit: 2017-09-20 | Discharge: 2017-09-20 | Disposition: A | Discharge status: Home / Self Care | Payer: 59 | Source: Ambulatory Visit | Attending: Orthopedic Surgery | Admitting: Orthopedic Surgery

## 2017-09-20 ENCOUNTER — Other Ambulatory Visit: Payer: Self-pay

## 2017-09-20 ENCOUNTER — Encounter (HOSPITAL_COMMUNITY): Payer: Self-pay

## 2017-09-20 DIAGNOSIS — Z01810 Encounter for preprocedural cardiovascular examination: Secondary | ICD-10-CM | POA: Insufficient documentation

## 2017-09-20 DIAGNOSIS — R001 Bradycardia, unspecified: Secondary | ICD-10-CM | POA: Diagnosis not present

## 2017-09-20 DIAGNOSIS — R51 Headache: Secondary | ICD-10-CM | POA: Diagnosis not present

## 2017-09-20 DIAGNOSIS — M1712 Unilateral primary osteoarthritis, left knee: Secondary | ICD-10-CM | POA: Diagnosis not present

## 2017-09-20 DIAGNOSIS — I10 Essential (primary) hypertension: Secondary | ICD-10-CM | POA: Insufficient documentation

## 2017-09-20 DIAGNOSIS — E039 Hypothyroidism, unspecified: Secondary | ICD-10-CM | POA: Insufficient documentation

## 2017-09-20 DIAGNOSIS — Z01818 Encounter for other preprocedural examination: Secondary | ICD-10-CM

## 2017-09-20 DIAGNOSIS — Z01812 Encounter for preprocedural laboratory examination: Secondary | ICD-10-CM | POA: Diagnosis present

## 2017-09-20 LAB — PROTIME-INR
INR: 0.93
Prothrombin Time: 12.4 seconds (ref 11.4–15.2)

## 2017-09-20 LAB — CBC WITH DIFFERENTIAL/PLATELET
Basophils Absolute: 0 10*3/uL (ref 0.0–0.1)
Basophils Relative: 0 %
Eosinophils Absolute: 0.1 10*3/uL (ref 0.0–0.7)
Eosinophils Relative: 2 %
HCT: 39.5 % (ref 36.0–46.0)
Hemoglobin: 13.4 g/dL (ref 12.0–15.0)
Lymphocytes Relative: 32 %
Lymphs Abs: 2.4 10*3/uL (ref 0.7–4.0)
MCH: 28.8 pg (ref 26.0–34.0)
MCHC: 33.9 g/dL (ref 30.0–36.0)
MCV: 84.9 fL (ref 78.0–100.0)
Monocytes Absolute: 0.6 10*3/uL (ref 0.1–1.0)
Monocytes Relative: 8 %
Neutro Abs: 4.3 10*3/uL (ref 1.7–7.7)
Neutrophils Relative %: 58 %
Platelets: 279 10*3/uL (ref 150–400)
RBC: 4.65 MIL/uL (ref 3.87–5.11)
RDW: 13.2 % (ref 11.5–15.5)
WBC: 7.4 10*3/uL (ref 4.0–10.5)

## 2017-09-20 LAB — URINALYSIS, ROUTINE W REFLEX MICROSCOPIC
Bacteria, UA: NONE SEEN
Bilirubin Urine: NEGATIVE
Glucose, UA: NEGATIVE mg/dL
Ketones, ur: NEGATIVE mg/dL
Leukocytes, UA: NEGATIVE
Nitrite: NEGATIVE
Protein, ur: NEGATIVE mg/dL
Specific Gravity, Urine: 1.024 (ref 1.005–1.030)
pH: 5 (ref 5.0–8.0)

## 2017-09-20 LAB — COMPREHENSIVE METABOLIC PANEL
ALT: 24 U/L (ref 14–54)
AST: 23 U/L (ref 15–41)
Albumin: 3.9 g/dL (ref 3.5–5.0)
Alkaline Phosphatase: 75 U/L (ref 38–126)
Anion gap: 5 (ref 5–15)
BUN: 13 mg/dL (ref 6–20)
CO2: 27 mmol/L (ref 22–32)
Calcium: 9 mg/dL (ref 8.9–10.3)
Chloride: 109 mmol/L (ref 101–111)
Creatinine, Ser: 0.81 mg/dL (ref 0.44–1.00)
GFR calc Af Amer: >60 mL/min (ref >60)
GFR calc non Af Amer: >60 mL/min (ref >60)
Glucose, Bld: 93 mg/dL (ref 65–99)
Potassium: 3.9 mmol/L (ref 3.5–5.1)
Sodium: 141 mmol/L (ref 135–145)
Total Bilirubin: 0.8 mg/dL (ref 0.3–1.2)
Total Protein: 7 g/dL (ref 6.5–8.1)

## 2017-09-20 LAB — SURGICAL PCR SCREEN
MRSA, PCR: NEGATIVE
STAPHYLOCOCCUS AUREUS: NEGATIVE

## 2017-09-20 LAB — ABO/RH: ABO/RH(D): O POS

## 2017-09-20 LAB — APTT: aPTT: 29 seconds (ref 24–36)

## 2017-09-20 NOTE — Progress Notes (Signed)
U/A faxed via epic to Dr Gladstone Lighter.

## 2017-09-23 NOTE — Progress Notes (Signed)
Final EKG done 09/20/17-epic

## 2017-09-24 NOTE — H&P (Signed)
TOTAL KNEE ADMISSION H&P  Patient is being admitted for left total knee arthroplasty.  Subjective:  Chief Complaint:left knee pain.  HPI: Katelyn [name], 61 y.o. female, has a history of pain and functional disability in the left knee due to arthritis and has failed non-surgical conservative treatments for greater than 12 weeks to includeNSAID's and/or analgesics, corticosteriod injections, flexibility and strengthening excercises, weight reduction as appropriate and activity modification.  Onset of symptoms was gradual, starting >10 years ago with gradually worsening course since that time. The patient noted prior procedures on the knee to include  arthroscopy and menisectomy on the left knee(s).  Patient currently rates pain in the left knee(s) at 8 out of 10 with activity. Patient has night pain, worsening of pain with activity and weight bearing, pain that interferes with activities of daily living, pain with passive range of motion, crepitus and joint swelling.  Patient has evidence of periarticular osteophytes and joint space narrowing by imaging studies. There is no active infection.  Patient Active Problem List   Diagnosis Date Noted  . Viral gastroenteritis   . Dehydration   . Intractable vomiting with nausea   . Colitis 01/08/2017  . Hypokalemia 01/08/2017  . Fatty liver 01/08/2017  . Anterior chest wall pain 05/26/2015  . Hypertension 10/18/2011  . Nephrolithiasis 10/18/2011  . Ovarian mass 10/18/2011  . Pulmonary nodule, right 10/18/2011  . WEIGHT GAIN 07/15/2009  . Hypothyroidism 05/27/2007   Past Medical History:  Diagnosis Date  . Anemia   . Headache(784.0)    cluster headaches, migraines- states is taking Atenolol at present as directed per Dr Lacinda Axon  to prevent migraine from increased stress-  states if on medication for several weeks that heart rate will drop to 40's  . History of kidney stones   . HYPOTHYROIDISM 05/27/2007  . Pneumonia    post op x 2 last  yrs ago  . PONV (postoperative nausea and vomiting)    likes phenergan with anesthesia, has woken up with 2 surgeries per patient   . WEIGHT GAIN 07/15/2009    Past Surgical History:  Procedure Laterality Date  . ABDOMINAL HYSTERECTOMY    . APPENDECTOMY    . DIAGNOSTIC LAPAROSCOPY    . FOOT SURGERY  2007   tumor removed from right foot/   . KNEE ARTHROSCOPY Left    x 3  . mortons neuroma removed from foot    . TONSILLECTOMY    . TUBAL LIGATION    . TYMPANOSTOMY TUBE PLACEMENT       Current Outpatient Medications  Medication Sig Dispense Refill Last Dose  . albuterol (PROVENTIL HFA;VENTOLIN HFA) 108 (90 Base) MCG/ACT inhaler Inhale 2 puffs into the lungs every 6 (six) hours as needed for wheezing or shortness of breath. 1 Inhaler 0 Taking  . atenolol (TENORMIN) 25 MG tablet TAKE 1 TABLET (25 MG TOTAL) BY MOUTH EVERY MORNING. FOR MIGRAINES (Patient taking differently: Take 25 mg by mouth daily as needed. For migraines) 90 tablet 1 Taking  . Biotin 1 MG CAPS Take 1 mg by mouth daily.   Taking  . cholecalciferol (VITAMIN D) 1000 UNITS tablet Take 1,000 Units by mouth daily.    Taking  . Cranberry 1000 MG CAPS Take 1,000 mg by mouth daily.   Taking  . Cyanocobalamin (VITAMIN B-12) 2500 MCG SUBL Place 2,500 mcg under the tongue daily.    Taking  . cyclobenzaprine (FLEXERIL) 10 MG tablet Take 10 mg by mouth every 8 (eight) hours as needed for  muscle spasms.    Taking  . HYDROmorphone (DILAUDID) 2 MG tablet Take 1 tablet (2 mg total) by mouth every 4 (four) hours as needed for severe pain. 30 tablet 0 Taking  . levothyroxine (SYNTHROID, LEVOTHROID) 150 MCG tablet Take 1 tablet (150 mcg total) by mouth daily. 90 tablet 3   . mirabegron ER (MYRBETRIQ) 25 MG TB24 tablet Take 25 mg by mouth daily.     . ondansetron (ZOFRAN ODT) 4 MG disintegrating tablet Take 1 tablet (4 mg total) by mouth every 8 (eight) hours as needed for nausea or vomiting. 30 tablet 0 Taking  . tamsulosin (FLOMAX) 0.4 MG CAPS  capsule Take 0.4 mg by mouth daily.   Taking  . phentermine 30 MG capsule TAKE ONE CAPSULE BY MOUTH DAILY 90 capsule 0 Taking   Allergies  Allergen Reactions  . Adhesive [Tape] Swelling and Other (See Comments)    Reaction:  Eats in skin  . Ciprofloxacin Hives and Shortness Of Breath  . Dimethicone Hives and Shortness Of Breath  . Erythromycin Hives and Shortness Of Breath  . Latex Hives and Shortness Of Breath  . Other Hives, Shortness Of Breath and Other (See Comments)    Pt is allergic to nitrile gloves.  Pt is allergic to antibacterial soaps- rash - states she only uses Dove soap  SCDs- patient states - had itching with this   . Penicillins Hives, Shortness Of Breath and Other (See Comments)    Has patient had a PCN reaction causing immediate rash, facial/tongue/throat swelling, SOB or lightheadedness with hypotension: Yes Has patient had a PCN reaction causing severe rash involving mucus membranes or skin necrosis: No Has patient had a PCN reaction that required hospitalization No Has patient had a PCN reaction occurring within the last 10 years: No If all of the above answers are "NO", then may proceed with Cephalosporin use.  . Red Dye Hives and Shortness Of Breath  . Shellfish Allergy Hives and Shortness Of Breath  . Sulfonamide Derivatives Hives and Shortness Of Breath  . Alcohol-Sulfur [Sulfur] Swelling and Other (See Comments)    Reaction:  Localized swelling  With alcohol   . Aspirin Nausea And Vomiting  . Doxycycline Nausea And Vomiting  . Hibiclens [Chlorhexidine]     Rash   . Ceftin [Cefuroxime] Rash    Social History   Tobacco Use  . Smoking status: Never Smoker  . Smokeless tobacco: Never Used  Substance Use Topics  . Alcohol use: No    Family History  Problem Relation Age of Onset  . Alzheimer's disease Mother   . Cervical cancer Mother   . Diabetes Sister   . Heart attack Brother   . Hypertension Neg Hx        family hx  . Heart disease Neg Hx         family hx     Review of Systems  Constitutional: Negative.   HENT: Negative.   Eyes: Negative.   Respiratory: Negative.   Cardiovascular: Negative.   Gastrointestinal: Positive for diarrhea. Negative for abdominal pain, blood in stool, constipation, heartburn, melena, nausea and vomiting.  Genitourinary: Positive for frequency. Negative for dysuria, flank pain, hematuria and urgency.  Musculoskeletal: Positive for joint pain and myalgias. Negative for back pain, falls and neck pain.  Skin: Negative.   Neurological: Positive for headaches. Negative for dizziness, tingling, tremors, sensory change, speech change, focal weakness, seizures and loss of consciousness.  Endo/Heme/Allergies: Negative.   Psychiatric/Behavioral: Negative.  Objective:  Physical Exam  Constitutional: She is oriented to person, place, and time. She appears well-developed. No distress.  Overweight  HENT:  Head: Normocephalic and atraumatic.  Right Ear: External ear normal.  Left Ear: External ear normal.  Nose: Nose normal.  Mouth/Throat: Oropharynx is clear and moist.  Eyes: Conjunctivae and EOM are normal.  Neck: Normal range of motion. Neck supple.  Cardiovascular: Normal rate, regular rhythm, normal heart sounds and intact distal pulses.  No murmur heard. Respiratory: Effort normal and breath sounds normal. No respiratory distress. She has no wheezes.  GI: Soft. Bowel sounds are normal. She exhibits no distension. There is no tenderness.  Musculoskeletal:       Right hip: Normal.       Left hip: Normal.       Right knee: Normal.       Left knee: She exhibits decreased range of motion and swelling. She exhibits no effusion and no erythema. Tenderness found. Medial joint line and lateral joint line tenderness noted.  Neurological: She is alert and oriented to person, place, and time. She has normal strength and normal reflexes. No sensory deficit.  Skin: No rash noted. She is not diaphoretic. No  erythema.  Psychiatric: She has a normal mood and affect. Her behavior is normal.    Vitals  BP: 130/90 (Sitting, Left Arm, Standard) HR: 68 bpm Weight: 178 Height: 67 in   Imaging Review Plain radiographs demonstrate severe degenerative joint disease of the left knee(s). The overall alignment ismild varus. The bone quality appears to be good for age and reported activity level.  Assessment/Plan:  End stage primary osteoarthritis, left knee   The patient history, physical examination, clinical judgment of the provider and imaging studies are consistent with end stage degenerative joint disease of the left knee(s) and total knee arthroplasty is deemed medically necessary. The treatment options including medical management, injection therapy arthroscopy and arthroplasty were discussed at length. The risks and benefits of total knee arthroplasty were presented and reviewed. The risks due to aseptic loosening, infection, stiffness, patella tracking problems, thromboembolic complications and other imponderables were discussed. The patient acknowledged the explanation, agreed to proceed with the plan and consent was signed. Patient is being admitted for inpatient treatment for surgery, pain control, PT, OT, prophylactic antibiotics, VTE prophylaxis, progressive ambulation and ADL's and discharge planning. The patient is planning to be discharged home with outpatient therapy.    PCP: Dr. Burnice Logan Urologist: Dr. Alyson Ingles Therapy Plans: outpatient therapy at [facility] with husband DME: has equipment Other: multiple allergies; can tolerate EC ASA     Ardeen Jourdain, PA-C

## 2017-09-24 NOTE — Anesthesia Preprocedure Evaluation (Signed)
Anesthesia Evaluation  Patient identified by MRN, date of birth, ID band Patient awake    Reviewed: Allergy & Precautions, H&P , NPO status , Patient's Chart, lab work & pertinent test results, reviewed documented beta blocker date and time   History of Anesthesia Complications (+) PONV and AWARENESS UNDER ANESTHESIA  Airway Mallampati: II  TM Distance: >3 FB Neck ROM: full    Dental no notable dental hx. (+) Dental Advisory Given   Pulmonary neg pulmonary ROS,    Pulmonary exam normal breath sounds clear to auscultation       Cardiovascular hypertension, Pt. on medications  Rhythm:regular Rate:Normal     Neuro/Psych  Headaches, negative psych ROS   GI/Hepatic negative GI ROS, Neg liver ROS,   Endo/Other  Hypothyroidism   Renal/GU      Musculoskeletal  (+) Arthritis ,   Abdominal   Peds  Hematology negative hematology ROS (+)   Anesthesia Other Findings     Reproductive/Obstetrics                             Anesthesia Physical  Anesthesia Plan  ASA: II  Anesthesia Plan: Spinal   Post-op Pain Management:  [facility] for Post-op pain   Induction:   PONV Risk Score and Plan: 4 or greater and Ondansetron, Midazolam, Propofol infusion and Treatment may vary due to age or medical condition  Airway Management Planned: Simple Face Mask and Natural Airway  Additional Equipment: None  Intra-op Plan:   Post-operative Plan:   Informed Consent: I have reviewed the patients History and Physical, chart, labs and discussed the procedure including the risks, benefits and alternatives for the proposed anesthesia with the patient or authorized representative who has indicated his/her understanding and acceptance.   Dental Advisory Given  Plan Discussed with: CRNA  Anesthesia Plan Comments: (  )        Anesthesia Quick Evaluation

## 2017-09-25 ENCOUNTER — Inpatient Hospital Stay (HOSPITAL_COMMUNITY): Payer: 59 | Admitting: Anesthesiology

## 2017-09-25 ENCOUNTER — Encounter (HOSPITAL_COMMUNITY): Payer: Self-pay | Admitting: Emergency Medicine

## 2017-09-25 ENCOUNTER — Other Ambulatory Visit: Payer: Self-pay

## 2017-09-25 ENCOUNTER — Inpatient Hospital Stay (HOSPITAL_COMMUNITY)
Admission: RE | Admit: 2017-09-25 | Discharge: 2017-09-27 | DRG: 470 | Disposition: A | Discharge status: Home Health | Payer: 59 | Source: Ambulatory Visit | Attending: Orthopedic Surgery | Admitting: Orthopedic Surgery

## 2017-09-25 ENCOUNTER — Encounter (HOSPITAL_COMMUNITY)
Admission: RE | Disposition: A | Discharge status: Home Health | Payer: Self-pay | Source: Ambulatory Visit | Attending: Orthopedic Surgery

## 2017-09-25 DIAGNOSIS — Z90710 Acquired absence of both cervix and uterus: Secondary | ICD-10-CM

## 2017-09-25 DIAGNOSIS — Z96652 Presence of left artificial knee joint: Secondary | ICD-10-CM | POA: Diagnosis not present

## 2017-09-25 DIAGNOSIS — Z91040 Latex allergy status: Secondary | ICD-10-CM

## 2017-09-25 DIAGNOSIS — Z881 Allergy status to other antibiotic agents status: Secondary | ICD-10-CM

## 2017-09-25 DIAGNOSIS — M1712 Unilateral primary osteoarthritis, left knee: Principal | ICD-10-CM | POA: Diagnosis present

## 2017-09-25 DIAGNOSIS — Z886 Allergy status to analgesic agent status: Secondary | ICD-10-CM | POA: Diagnosis not present

## 2017-09-25 DIAGNOSIS — E039 Hypothyroidism, unspecified: Secondary | ICD-10-CM | POA: Diagnosis not present

## 2017-09-25 DIAGNOSIS — K760 Fatty (change of) liver, not elsewhere classified: Secondary | ICD-10-CM | POA: Diagnosis present

## 2017-09-25 DIAGNOSIS — Z880 Allergy status to penicillin: Secondary | ICD-10-CM

## 2017-09-25 DIAGNOSIS — I10 Essential (primary) hypertension: Secondary | ICD-10-CM | POA: Diagnosis present

## 2017-09-25 DIAGNOSIS — Z91013 Allergy to seafood: Secondary | ICD-10-CM

## 2017-09-25 DIAGNOSIS — G8918 Other acute postprocedural pain: Secondary | ICD-10-CM | POA: Diagnosis not present

## 2017-09-25 DIAGNOSIS — Z882 Allergy status to sulfonamides status: Secondary | ICD-10-CM

## 2017-09-25 DIAGNOSIS — Z79890 Hormone replacement therapy (postmenopausal): Secondary | ICD-10-CM | POA: Diagnosis not present

## 2017-09-25 DIAGNOSIS — M25562 Pain in left knee: Secondary | ICD-10-CM | POA: Diagnosis not present

## 2017-09-25 HISTORY — PX: TOTAL KNEE ARTHROPLASTY: SHX125

## 2017-09-25 LAB — TYPE AND SCREEN
ABO/RH(D): O POS
Antibody Screen: NEGATIVE

## 2017-09-25 SURGERY — ARTHROPLASTY, KNEE, TOTAL
Anesthesia: Spinal | Site: Knee | Laterality: Left

## 2017-09-25 MED ORDER — EPHEDRINE SULFATE-NACL 50-0.9 MG/10ML-% IV SOSY
PREFILLED_SYRINGE | INTRAVENOUS | Status: DC | PRN
  Administered 2017-09-25 (×2): 5 mg via INTRAVENOUS

## 2017-09-25 MED ORDER — PROPOFOL 10 MG/ML IV BOLUS
INTRAVENOUS | Status: AC
Start: 2017-09-25 — End: ?
  Filled 2017-09-25: qty 20

## 2017-09-25 MED ORDER — CYCLOBENZAPRINE HCL 10 MG PO TABS
10.0000 mg | ORAL_TABLET | Freq: Three times a day (TID) | ORAL | Status: DC | PRN
  Administered 2017-09-25 – 2017-09-26 (×3): 10 mg via ORAL
  Filled 2017-09-25 (×3): qty 1

## 2017-09-25 MED ORDER — PROMETHAZINE HCL 25 MG/ML IJ SOLN: 6.2500 mg | INTRAMUSCULAR | Status: DC | PRN

## 2017-09-25 MED ORDER — BUPIVACAINE HCL (PF) 0.25 % IJ SOLN
INTRAMUSCULAR | Status: AC
Start: 2017-09-25 — End: ?
  Filled 2017-09-25: qty 30

## 2017-09-25 MED ORDER — PROPOFOL 10 MG/ML IV BOLUS
INTRAVENOUS | Status: DC | PRN
  Administered 2017-09-25: 20 mg via INTRAVENOUS

## 2017-09-25 MED ORDER — ONDANSETRON HCL 4 MG/2ML IJ SOLN
INTRAMUSCULAR | Status: DC | PRN
  Administered 2017-09-25: 4 mg via INTRAVENOUS

## 2017-09-25 MED ORDER — STERILE WATER FOR IRRIGATION IR SOLN
Status: DC | PRN
  Administered 2017-09-25: 2000 mL

## 2017-09-25 MED ORDER — BUPIVACAINE LIPOSOME 1.3 % IJ SUSP
INTRAMUSCULAR | Status: DC | PRN
  Administered 2017-09-25: 20 mL

## 2017-09-25 MED ORDER — OXYCODONE HCL 5 MG PO TABS
5.0000 mg | ORAL_TABLET | ORAL | Status: DC | PRN
  Administered 2017-09-25 – 2017-09-26 (×2): 15 mg via ORAL
  Filled 2017-09-25 (×2): qty 3

## 2017-09-25 MED ORDER — OXYCODONE HCL 5 MG PO TABS: 5.0000 mg | ORAL_TABLET | ORAL | 0 refills | Status: DC | PRN

## 2017-09-25 MED ORDER — MENTHOL 3 MG MT LOZG: 1.0000 | LOZENGE | OROMUCOSAL | Status: DC | PRN

## 2017-09-25 MED ORDER — SODIUM CHLORIDE 0.9 % IJ SOLN
INTRAMUSCULAR | Status: AC
Start: 2017-09-25 — End: ?
  Filled 2017-09-25: qty 20

## 2017-09-25 MED ORDER — PHENYLEPHRINE 40 MCG/ML (10ML) SYRINGE FOR IV PUSH (FOR BLOOD PRESSURE SUPPORT)
PREFILLED_SYRINGE | INTRAVENOUS | Status: AC
Start: 2017-09-25 — End: ?
  Filled 2017-09-25: qty 10

## 2017-09-25 MED ORDER — POLYMYXIN B SULFATE 500000 UNITS IJ SOLR
INTRAMUSCULAR | Status: AC
  Filled 2017-09-25: qty 500000

## 2017-09-25 MED ORDER — FLEET ENEMA 7-19 GM/118ML RE ENEM: 1.0000 | ENEMA | Freq: Once | RECTAL | Status: DC | PRN

## 2017-09-25 MED ORDER — CHLORHEXIDINE GLUCONATE 4 % EX LIQD: 60.0000 mL | Freq: Once | CUTANEOUS | Status: DC

## 2017-09-25 MED ORDER — RIVAROXABAN 10 MG PO TABS
10.0000 mg | ORAL_TABLET | Freq: Every day | ORAL | Status: DC
  Administered 2017-09-26 – 2017-09-27 (×2): 10 mg via ORAL
  Filled 2017-09-25 (×2): qty 1

## 2017-09-25 MED ORDER — PHENYLEPHRINE 40 MCG/ML (10ML) SYRINGE FOR IV PUSH (FOR BLOOD PRESSURE SUPPORT)
PREFILLED_SYRINGE | INTRAVENOUS | Status: DC | PRN
  Administered 2017-09-25 (×2): 80 ug via INTRAVENOUS

## 2017-09-25 MED ORDER — MIDAZOLAM HCL 2 MG/2ML IJ SOLN
1.0000 mg | Freq: Once | INTRAMUSCULAR | Status: AC
  Administered 2017-09-25: 1 mg via INTRAVENOUS

## 2017-09-25 MED ORDER — FENTANYL CITRATE (PF) 100 MCG/2ML IJ SOLN
INTRAMUSCULAR | Status: AC
  Filled 2017-09-25: qty 2

## 2017-09-25 MED ORDER — PROPOFOL 500 MG/50ML IV EMUL
INTRAVENOUS | Status: DC | PRN
  Administered 2017-09-25: 40 ug/kg/min via INTRAVENOUS

## 2017-09-25 MED ORDER — ONDANSETRON HCL 4 MG/2ML IJ SOLN
4.0000 mg | Freq: Four times a day (QID) | INTRAMUSCULAR | Status: DC | PRN
  Administered 2017-09-25: 4 mg via INTRAVENOUS
  Filled 2017-09-25: qty 2

## 2017-09-25 MED ORDER — SODIUM CHLORIDE 0.9 % IJ SOLN
INTRAMUSCULAR | Status: DC | PRN
  Administered 2017-09-25: 20 mL

## 2017-09-25 MED ORDER — BUPIVACAINE HCL (PF) 0.75 % IJ SOLN
INTRAMUSCULAR | Status: DC | PRN
  Administered 2017-09-25: 1.8 mL

## 2017-09-25 MED ORDER — FERROUS SULFATE 325 (65 FE) MG PO TABS
325.0000 mg | ORAL_TABLET | Freq: Three times a day (TID) | ORAL | Status: DC
  Administered 2017-09-26 – 2017-09-27 (×3): 325 mg via ORAL
  Filled 2017-09-25 (×3): qty 1

## 2017-09-25 MED ORDER — PROPOFOL 10 MG/ML IV BOLUS
INTRAVENOUS | Status: AC
  Filled 2017-09-25: qty 20

## 2017-09-25 MED ORDER — LEVOTHYROXINE SODIUM 75 MCG PO TABS
150.0000 ug | ORAL_TABLET | Freq: Every day | ORAL | Status: DC
  Administered 2017-09-26 – 2017-09-27 (×2): 150 ug via ORAL
  Filled 2017-09-25 (×2): qty 2

## 2017-09-25 MED ORDER — VANCOMYCIN HCL IN DEXTROSE 1-5 GM/200ML-% IV SOLN
INTRAVENOUS | Status: AC
  Filled 2017-09-25: qty 200

## 2017-09-25 MED ORDER — PROPOFOL 10 MG/ML IV BOLUS
INTRAVENOUS | Status: AC
  Filled 2017-09-25: qty 40

## 2017-09-25 MED ORDER — PHENOL 1.4 % MT LIQD: 1.0000 | OROMUCOSAL | Status: DC | PRN

## 2017-09-25 MED ORDER — CYCLOBENZAPRINE HCL 10 MG PO TABS: 10.0000 mg | ORAL_TABLET | Freq: Three times a day (TID) | ORAL | 0 refills | Status: DC | PRN

## 2017-09-25 MED ORDER — LIDOCAINE 2% (20 MG/ML) 5 ML SYRINGE
INTRAMUSCULAR | Status: AC
  Filled 2017-09-25: qty 5

## 2017-09-25 MED ORDER — ONDANSETRON HCL 4 MG PO TABS: 4.0000 mg | ORAL_TABLET | Freq: Four times a day (QID) | ORAL | Status: DC | PRN

## 2017-09-25 MED ORDER — MIRABEGRON ER 25 MG PO TB24
25.0000 mg | ORAL_TABLET | Freq: Every day | ORAL | Status: DC
Start: 2017-09-26 — End: 2017-09-27
  Administered 2017-09-26 – 2017-09-27 (×2): 25 mg via ORAL
  Filled 2017-09-25 (×2): qty 1

## 2017-09-25 MED ORDER — OXYCODONE HCL 5 MG PO TABS
15.0000 mg | ORAL_TABLET | ORAL | Status: DC | PRN
  Administered 2017-09-25 (×2): 15 mg via ORAL
  Filled 2017-09-25 (×2): qty 3

## 2017-09-25 MED ORDER — VANCOMYCIN HCL IN DEXTROSE 1-5 GM/200ML-% IV SOLN
1000.0000 mg | Freq: Two times a day (BID) | INTRAVENOUS | Status: AC
  Administered 2017-09-25: 1000 mg via INTRAVENOUS
  Filled 2017-09-25: qty 200

## 2017-09-25 MED ORDER — DEXAMETHASONE SODIUM PHOSPHATE 10 MG/ML IJ SOLN
INTRAMUSCULAR | Status: AC
  Filled 2017-09-25: qty 1

## 2017-09-25 MED ORDER — ALUM & MAG HYDROXIDE-SIMETH 200-200-20 MG/5ML PO SUSP: 30.0000 mL | ORAL | Status: DC | PRN

## 2017-09-25 MED ORDER — BISACODYL 5 MG PO TBEC: 5.0000 mg | DELAYED_RELEASE_TABLET | Freq: Every day | ORAL | Status: DC | PRN

## 2017-09-25 MED ORDER — MIDAZOLAM HCL 2 MG/2ML IJ SOLN
INTRAMUSCULAR | Status: AC
  Filled 2017-09-25: qty 2

## 2017-09-25 MED ORDER — FENTANYL CITRATE (PF) 100 MCG/2ML IJ SOLN
INTRAMUSCULAR | Status: DC | PRN
  Administered 2017-09-25 (×2): 25 ug via INTRAVENOUS
  Administered 2017-09-25: 50 ug via INTRAVENOUS

## 2017-09-25 MED ORDER — ONDANSETRON 4 MG PO TBDP: 4.0000 mg | ORAL_TABLET | Freq: Three times a day (TID) | ORAL | Status: DC | PRN

## 2017-09-25 MED ORDER — ACETAMINOPHEN 650 MG RE SUPP: 650.0000 mg | RECTAL | Status: DC | PRN

## 2017-09-25 MED ORDER — POLYMYXIN B SULFATE 500000 UNITS IJ SOLR
INTRAMUSCULAR | Status: DC | PRN
  Administered 2017-09-25: 500 mL

## 2017-09-25 MED ORDER — PHENYLEPHRINE HCL 10 MG/ML IJ SOLN
INTRAMUSCULAR | Status: AC
Start: 2017-09-25 — End: ?
  Filled 2017-09-25: qty 2

## 2017-09-25 MED ORDER — ALBUTEROL SULFATE (2.5 MG/3ML) 0.083% IN NEBU: 2.5000 mg | INHALATION_SOLUTION | Freq: Four times a day (QID) | RESPIRATORY_TRACT | Status: DC | PRN

## 2017-09-25 MED ORDER — LACTATED RINGERS IV SOLN
INTRAVENOUS | Status: DC
  Administered 2017-09-25 – 2017-09-26 (×3): via INTRAVENOUS

## 2017-09-25 MED ORDER — POLYETHYLENE GLYCOL 3350 17 G PO PACK: 17.0000 g | PACK | Freq: Every day | ORAL | Status: DC | PRN

## 2017-09-25 MED ORDER — ATENOLOL 25 MG PO TABS: 25.0000 mg | ORAL_TABLET | Freq: Every day | ORAL | Status: DC | PRN

## 2017-09-25 MED ORDER — ONDANSETRON HCL 4 MG/2ML IJ SOLN
INTRAMUSCULAR | Status: AC
  Filled 2017-09-25: qty 2

## 2017-09-25 MED ORDER — 0.9 % SODIUM CHLORIDE (POUR BTL) OPTIME
TOPICAL | Status: DC | PRN
  Administered 2017-09-25: 1000 mL

## 2017-09-25 MED ORDER — HYDROMORPHONE HCL 1 MG/ML IJ SOLN
INTRAMUSCULAR | Status: AC
  Filled 2017-09-25: qty 1

## 2017-09-25 MED ORDER — DIPHENHYDRAMINE HCL 50 MG/ML IJ SOLN
12.5000 mg | Freq: Once | INTRAMUSCULAR | Status: AC
  Administered 2017-09-25: 12.5 mg via INTRAVENOUS

## 2017-09-25 MED ORDER — DEXAMETHASONE SODIUM PHOSPHATE 10 MG/ML IJ SOLN
INTRAMUSCULAR | Status: DC | PRN
  Administered 2017-09-25: 10 mg via INTRAVENOUS

## 2017-09-25 MED ORDER — VANCOMYCIN HCL IN DEXTROSE 1-5 GM/200ML-% IV SOLN
1000.0000 mg | INTRAVENOUS | Status: AC
  Administered 2017-09-25: 1000 mg via INTRAVENOUS

## 2017-09-25 MED ORDER — HYDROMORPHONE HCL 1 MG/ML IJ SOLN
0.2500 mg | INTRAMUSCULAR | Status: DC | PRN
  Administered 2017-09-25 (×3): 0.5 mg via INTRAVENOUS

## 2017-09-25 MED ORDER — HYDROMORPHONE HCL 2 MG PO TABS: 2.0000 mg | ORAL_TABLET | ORAL | Status: DC | PRN

## 2017-09-25 MED ORDER — PROMETHAZINE HCL 25 MG PO TABS
25.0000 mg | ORAL_TABLET | Freq: Three times a day (TID) | ORAL | Status: DC | PRN
  Administered 2017-09-25: 25 mg via ORAL
  Filled 2017-09-25: qty 1

## 2017-09-25 MED ORDER — BUPIVACAINE LIPOSOME 1.3 % IJ SUSP
20.0000 mL | Freq: Once | INTRAMUSCULAR | Status: AC
  Filled 2017-09-25: qty 20

## 2017-09-25 MED ORDER — TRANEXAMIC ACID 1000 MG/10ML IV SOLN
1000.0000 mg | INTRAVENOUS | Status: AC
  Administered 2017-09-25: 1000 mg via INTRAVENOUS
  Filled 2017-09-25: qty 10

## 2017-09-25 MED ORDER — MIDAZOLAM HCL 5 MG/5ML IJ SOLN
INTRAMUSCULAR | Status: DC | PRN
  Administered 2017-09-25: 2 mg via INTRAVENOUS

## 2017-09-25 MED ORDER — HYDROMORPHONE HCL 1 MG/ML IJ SOLN
0.5000 mg | INTRAMUSCULAR | Status: DC | PRN
  Administered 2017-09-25 – 2017-09-26 (×6): 0.5 mg via INTRAVENOUS
  Filled 2017-09-25 (×6): qty 0.5

## 2017-09-25 MED ORDER — LACTATED RINGERS IV SOLN
INTRAVENOUS | Status: DC
  Administered 2017-09-25 (×2): via INTRAVENOUS

## 2017-09-25 MED ORDER — FENTANYL CITRATE (PF) 100 MCG/2ML IJ SOLN
25.0000 ug | INTRAMUSCULAR | Status: DC | PRN
  Administered 2017-09-25 (×3): 50 ug via INTRAVENOUS

## 2017-09-25 MED ORDER — ACETAMINOPHEN 325 MG PO TABS
650.0000 mg | ORAL_TABLET | ORAL | Status: DC | PRN
  Administered 2017-09-26 – 2017-09-27 (×3): 650 mg via ORAL
  Filled 2017-09-25 (×3): qty 2

## 2017-09-25 MED ORDER — TAMSULOSIN HCL 0.4 MG PO CAPS
0.4000 mg | ORAL_CAPSULE | Freq: Every day | ORAL | Status: DC
  Administered 2017-09-26 – 2017-09-27 (×2): 0.4 mg via ORAL
  Filled 2017-09-25 (×2): qty 1

## 2017-09-25 MED ORDER — DIPHENHYDRAMINE HCL 50 MG/ML IJ SOLN
INTRAMUSCULAR | Status: AC
  Filled 2017-09-25: qty 1

## 2017-09-25 MED ORDER — PHENYLEPHRINE HCL 10 MG/ML IJ SOLN
INTRAVENOUS | Status: DC | PRN
  Administered 2017-09-25: 25 ug/min via INTRAVENOUS

## 2017-09-25 MED ORDER — HYDROCODONE-ACETAMINOPHEN 5-325 MG PO TABS
1.0000 | ORAL_TABLET | ORAL | Status: DC | PRN
  Administered 2017-09-25 – 2017-09-26 (×3): 1 via ORAL
  Filled 2017-09-25 (×3): qty 1

## 2017-09-25 SURGICAL SUPPLY — 67 items
AGENT HMST SPONGE THK3/8 (HEMOSTASIS) ×1
BAG DECANTER FOR FLEXI CONT (MISCELLANEOUS) IMPLANT
BAG SPEC THK2 15X12 ZIP CLS (MISCELLANEOUS)
BAG ZIPLOCK 12X15 (MISCELLANEOUS) IMPLANT
BANDAGE ACE 4X5 VEL STRL LF (GAUZE/BANDAGES/DRESSINGS) ×2 IMPLANT
BANDAGE ACE 6X5 VEL STRL LF (GAUZE/BANDAGES/DRESSINGS) ×2 IMPLANT
BLADE SAG 18X100X1.27 (BLADE) ×2 IMPLANT
BLADE SAW SGTL 11.0X1.19X90.0M (BLADE) ×2 IMPLANT
BNDG GAUZE ELAST 4 BULKY (GAUZE/BANDAGES/DRESSINGS) ×4 IMPLANT
BONE CEMENT GENTAMICIN (Cement) ×4 IMPLANT
CAP KNEE TOTAL 3 SIGMA ×1 IMPLANT
CEMENT BONE GENTAMICIN 40 (Cement) ×2 IMPLANT
COVER SURGICAL LIGHT HANDLE (MISCELLANEOUS) ×2 IMPLANT
CUFF TOURN SGL QUICK 34 (TOURNIQUET CUFF) ×2
CUFF TRNQT CYL 34X4X40X1 (TOURNIQUET CUFF) ×1 IMPLANT
DECANTER SPIKE VIAL GLASS SM (MISCELLANEOUS) ×2 IMPLANT
DRAPE INCISE IOBAN 66X45 STRL (DRAPES) IMPLANT
DRAPE U-SHAPE 47X51 STRL (DRAPES) ×1 IMPLANT
DRSG ADAPTIC 3X8 NADH LF (GAUZE/BANDAGES/DRESSINGS) ×2 IMPLANT
DRSG PAD ABDOMINAL 8X10 ST (GAUZE/BANDAGES/DRESSINGS) ×4 IMPLANT
DURAPREP 26ML APPLICATOR (WOUND CARE) IMPLANT
ELECT REM PT RETURN 15FT ADLT (MISCELLANEOUS) ×2 IMPLANT
EVACUATOR 1/8 PVC DRAIN (DRAIN) ×2 IMPLANT
FACESHIELD WRAPAROUND (MASK) ×8 IMPLANT
FACESHIELD WRAPAROUND OR TEAM (MASK) ×1 IMPLANT
GAUZE SPONGE 4X4 12PLY STRL (GAUZE/BANDAGES/DRESSINGS) ×2 IMPLANT
GLOVE BIOGEL PI IND STRL 6.5 (GLOVE) IMPLANT
GLOVE BIOGEL PI IND STRL 8.5 (GLOVE) ×1 IMPLANT
GLOVE BIOGEL PI INDICATOR 6.5 (GLOVE)
GLOVE BIOGEL PI INDICATOR 8.5 (GLOVE)
GLOVE ECLIPSE 8.0 STRL XLNG CF (GLOVE) ×2 IMPLANT
GLOVE SURG SS PI 6.5 STRL IVOR (GLOVE) ×1 IMPLANT
GOWN STRL REUS W/TWL LRG LVL3 (GOWN DISPOSABLE) ×2 IMPLANT
GOWN STRL REUS W/TWL XL LVL3 (GOWN DISPOSABLE) ×2 IMPLANT
HANDPIECE INTERPULSE COAX TIP (DISPOSABLE) ×2
HEMOSTAT SPONGE AVITENE ULTRA (HEMOSTASIS) ×2 IMPLANT
IMMOBILIZER KNEE 20 (SOFTGOODS) ×2
IMMOBILIZER KNEE 20 THIGH 36 (SOFTGOODS) ×1 IMPLANT
IMMOBILIZER KNEE 22 UNIV (SOFTGOODS) ×1 IMPLANT
MANIFOLD NEPTUNE II (INSTRUMENTS) ×2 IMPLANT
NDL HYPO 21X1.5 SAFETY (NEEDLE) IMPLANT
NEEDLE HYPO 21X1.5 SAFETY (NEEDLE) IMPLANT
NEEDLE HYPO 22GX1.5 SAFETY (NEEDLE) IMPLANT
NS IRRIG 1000ML POUR BTL (IV SOLUTION) ×1 IMPLANT
PACK TOTAL KNEE CUSTOM (KITS) ×2 IMPLANT
PAD ABD 8X10 STRL (GAUZE/BANDAGES/DRESSINGS) ×1 IMPLANT
PADDING CAST COTTON 6X4 STRL (CAST SUPPLIES) ×2 IMPLANT
PENCIL SMOKE EVAC W/HOLSTER (ELECTROSURGICAL) ×2 IMPLANT
POSITIONER SURGICAL ARM (MISCELLANEOUS) ×2 IMPLANT
SET HNDPC FAN SPRY TIP SCT (DISPOSABLE) ×1 IMPLANT
SET PAD KNEE POSITIONER (MISCELLANEOUS) ×2 IMPLANT
SPONGE LAP 18X18 X RAY DECT (DISPOSABLE) IMPLANT
STRIP CLOSURE SKIN 1/2X4 (GAUZE/BANDAGES/DRESSINGS) ×4 IMPLANT
SUT BONE WAX W31G (SUTURE) IMPLANT
SUT MNCRL AB 4-0 PS2 18 (SUTURE) ×2 IMPLANT
SUT VIC AB 1 CT1 27 (SUTURE) ×4
SUT VIC AB 1 CT1 27XBRD ANTBC (SUTURE) ×2 IMPLANT
SUT VIC AB 2-0 CT1 27 (SUTURE) ×6
SUT VIC AB 2-0 CT1 TAPERPNT 27 (SUTURE) ×3 IMPLANT
SUT VLOC 180 0 24IN GS25 (SUTURE) ×2 IMPLANT
SYR 20CC LL (SYRINGE) ×4 IMPLANT
TOWER CARTRIDGE SMART MIX (DISPOSABLE) ×2 IMPLANT
TRAY FOLEY BAG SILVER LF 16FR (SET/KITS/TRAYS/PACK) ×2 IMPLANT
TRAY FOLEY W/METER SILVER 16FR (SET/KITS/TRAYS/PACK) ×2 IMPLANT
WATER STERILE IRR 1000ML POUR (IV SOLUTION) ×2 IMPLANT
WRAP KNEE MAXI GEL POST OP (GAUZE/BANDAGES/DRESSINGS) ×2 IMPLANT
YANKAUER SUCT BULB TIP 10FT TU (MISCELLANEOUS) ×2 IMPLANT

## 2017-09-25 NOTE — Op Note (Signed)
[name], Katelyn               ACCOUNT NO.:  [id number]  MEDICAL RECORD NO.:  [id number]  LOCATION:                                 FACILITY:  PHYSICIAN:  Kipp Brood. Delyla Sandeen, M.D.DATE OF BIRTH:  [birth year]  DATE OF PROCEDURE:  09/25/2017 DATE OF DISCHARGE:                              OPERATIVE REPORT   SURGEON:  Kipp Brood. Gladstone Lighter, M.D.  OPERATIVE ASSISTANT:  Ardeen Jourdain, PA.  PREOPERATIVE DIAGNOSIS:  Primary osteoarthritis with flexion contracture, left knee.  POSTOPERATIVE DIAGNOSIS:  Primary osteoarthritis with flexion contracture, left knee.  OPERATION:  Left total knee arthroplasty utilizing the DePuy system, all 3 components were cemented.  Gentamicin was used in the cement.  The sizes used was a size 2.5 left femoral component posterior cruciate sacrificing type.  The tibial tray was a size 2.  The insert was a polyethylene rotating platform insert size 2.5, 12.5 mm thickness.  The patella button was a size 35 with 3 pegs.  DESCRIPTION OF PROCEDURE:  Under spinal anesthesia, we first did a prep with the Betadine scrub, then the Betadine solution since she was allergic to alcohol.  After the sterile prep and draping was carried out, we did the appropriate time-out.  Also marked the appropriate left leg in the holding area.  The preop and postop diagnosis were the same.  Under spinal anesthesia, we did a routine orthopedic prep and draping and time-out as I mentioned.  Note, I did take 1 minute out and note, we observed all of the possible allergies that she had.  She is allergic to alcohol, so we utilized the Betadine scrub and prep.  She is allergic to tape.  So, we took all precautions to make sure none of the drapes were on the bare skin.  We did not use Vi-Drape over the knee as well.  So at this time, the leg was exsanguinated with the Esmarch.  The towel was placed over the skin, so the Esmarch did not touch her skin.  We elevated the tourniquet to 325  mmHg.  We then placed the knee in the [facility] knee holder.  An anterior incision was carried out.  Bleeders were identified and cauterized.  She had 1 g of vancomycin during the procedure.  She also had 1 g of tranexamic acid.  After the incision was made, 2 flaps were created.  Self-retaining retractors were inserted.  I then carried out a median parapatellar incision.  I reflected the patella laterally.  I did lateral and medial meniscectomies.  Also sized the anterior and posterior cruciate ligaments.  Initial drill hole was made in the intercondylar notch.  The canal finder was inserted down the canal.  We had excellent position.  We removed that, thoroughly water picked out the canal.  We then removed 11 mm thickness with a 4-degree valgus angle off the distal femur.  After that, we measured the femur to be a size 2.5.  We did the appropriate anterior, posterior, chamfering cuts for a size 2.5 component.  Next, attention was directed to the tibia.  Our cut of the tibia was made with the external guide.  At this time, we utilized a 6  mm measurement from the tibial plateau side medially.  After that, we then inserted our lamina spreaders, removed the posterior spurs and completed our meniscectomies posteriorly.  At this particular time, we then inserted our spacer blocks.  We had excellent fit with the 12.5 mm thickness.  We then went on to prepare the tibial plateau.  We made our keel cut out of the tibial plateau.  We then went on and did our distal notch cut out of the distal femur.  All trial components were inserted.  We then reduced the knee and had excellent function with a 12.5 mm thickness insert.  We then did a resurfacing procedure on the patella for a size 35 patella.  Three drill holes were made in the articular surface of the patella.  The trial button was inserted, we had a good fit.  We then removed all trial components, thoroughly water picked out the knee, dried the  knee out, cemented all 3 components in simultaneously with gentamicin and cement. Following that, we then removed all loose pieces of cement.  After the cement was hardened, we water picked the knee out again.  I then inserted my permanent rotating platform polyethylene size 2.5, 12.5 mm thickness.  We reduced the knee and had an excellent function.  We then inserted a Hemovac drain and closed the knee in layers over a Hemovac drain.  Sterile dressings were applied.  No tape was applied to the skin.  The patient will be admitted to the hospital.  She will be maintained on the Xarelto as a blood thinner.          ______________________________ Kipp Brood. Gladstone Lighter, M.D.     RAG/MEDQ  D:  09/25/2017  T:  09/25/2017  Job:  [id number]

## 2017-09-25 NOTE — Transfer of Care (Signed)
Immediate Anesthesia Transfer of Care Note  Patient: Katelyn [name]  Procedure(s) Performed: LEFT TOTAL KNEE ARTHROPLASTY (Left Knee)  Patient Location: PACU  Anesthesia Type:Spinal and MAC combined with regional for post-op pain  Level of Consciousness: awake, alert  and oriented  Airway & Oxygen Therapy: Patient Spontanous Breathing and Patient connected to face mask oxygen  Post-op Assessment: Report given to RN and Post -op Vital signs reviewed and stable  Post vital signs: Reviewed and stable  Last Vitals:  Vitals:   09/25/17 0635  BP: (!) 157/78  Pulse: 66  Resp: 16  Temp: 36.8 C  SpO2: 98%    Last Pain:  Vitals:   09/25/17 0635  TempSrc: Oral      Patients Stated Pain Goal: 4 (03/49/61 1643)  Complications: No apparent anesthesia complications

## 2017-09-25 NOTE — Brief Op Note (Signed)
09/25/2017  10:52 AM  PATIENT:  Katelyn [name]  61 y.o. female  PRE-OPERATIVE DIAGNOSIS:  Left knee Primary  osteoarthritis with Flexion Contractures.  POST-OPERATIVE DIAGNOSIS:  left knee Primary  Osteoarthritis with Flexion Contractures.  PROCEDURE:  Procedure(s): LEFT TOTAL KNEE ARTHROPLASTY (Left)  SURGEON:  Surgeon(s) and Role:    Latanya Maudlin, MD - Primary  PHYSICIAN ASSISTANT:Amber [place] PA   ASSISTANTS: Ardeen Jourdain PA   ANESTHESIA:   spinal and Adductor Block  EBL:  50 mL   BLOOD ADMINISTERED:none  DRAINS: (One) Hemovact drain(s) in the Left Knee. with  Suction Open   LOCAL MEDICATIONS USED:  OTHER 20cc of Exparel mixed with 20cc of Normal Saline  SPECIMEN:  No Specimen  DISPOSITION OF SPECIMEN:  N/A  COUNTS:  YES  TOURNIQUET:  * Missing tourniquet times found for documented tourniquets in log: [id number] *  DICTATION: .Other Dictation: Dictation Number [id number]  PLAN OF CARE: Admit to inpatient   PATIENT DISPOSITION:  Stable in Or   Delay start of Pharmacological VTE agent (>24hrs) due to surgical blood loss or risk of bleeding: yes

## 2017-09-25 NOTE — Progress Notes (Signed)
Benadryl 12.5mg  IVP given for itching of nose- no redness -rash-noted- right upper arm itching- dark pink area- no whelps-hives noted- Dr. Fransisco Beau aware

## 2017-09-25 NOTE — Anesthesia Procedure Notes (Signed)
Spinal  Patient location during procedure: OR Start time: 09/25/2017 9:13 AM End time: 09/25/2017 9:17 AM Staffing Anesthesiologist: Audry Pili, MD Performed: anesthesiologist  Preanesthetic Checklist Completed: patient identified, surgical consent, pre-op evaluation, timeout performed, IV checked, risks and benefits discussed and monitors and equipment checked Spinal Block Patient position: sitting Prep: Betadine Patient monitoring: heart rate, cardiac monitor, continuous pulse ox and blood pressure Approach: midline Location: L3-4 Injection technique: single-shot Needle Needle type: Pencan  Needle gauge: 24 G Additional Notes Functioning IV was confirmed and monitors were applied. Sterile prep and drape, including hand hygiene, mask, and sterile gloves were used. The patient was positioned and the spine was prepped. The skin was anesthetized with lidocaine. Free flow of clear CSF was obtained prior to injecting local anesthetic into the CSF. The spinal needle aspirated freely following injection. The needle was carefully withdrawn. The patient tolerated the procedure well. Consent was obtained prior to the procedure with all questions answered and concerns addressed. Risks including, but not limited to, bleeding, infection, nerve damage, paralysis, failed block, inadequate analgesia, allergic reaction, high spinal, itching, and headache were discussed and the patient wished to proceed.  Renold Don, MD

## 2017-09-25 NOTE — Anesthesia Postprocedure Evaluation (Signed)
Anesthesia Post Note  Patient: Katelyn [name]  Procedure(s) Performed: LEFT TOTAL KNEE ARTHROPLASTY (Left Knee)     Patient location during evaluation: PACU Anesthesia Type: Spinal Level of consciousness: awake and alert Pain management: pain level controlled Vital Signs Assessment: post-procedure vital signs reviewed and stable Respiratory status: spontaneous breathing, nonlabored ventilation and respiratory function stable Cardiovascular status: blood pressure returned to baseline and stable Postop Assessment: no apparent nausea or vomiting and spinal receding Anesthetic complications: no    Last Vitals:  Vitals:   09/25/17 1330 09/25/17 1355  BP: (!) 150/80 (!) 159/82  Pulse: 74 82  Resp: 16 16  Temp: 36.9 C 36.9 C  SpO2: 95% 97%                Audry Pili

## 2017-09-25 NOTE — Progress Notes (Signed)
Neoprene gloves used due to pt allergy to latex and nitrile

## 2017-09-25 NOTE — Discharge Instructions (Signed)

## 2017-09-25 NOTE — Progress Notes (Signed)
Pinkness on right upper arm gone.

## 2017-09-25 NOTE — Interval H&P Note (Signed)
History and Physical Interval Note:  09/25/2017 8:11 AM  Katelyn [name]  has presented today for surgery, with the diagnosis of Left knee osteoarthritis   The various methods of treatment have been discussed with the patient and family. After consideration of risks, benefits and other options for treatment, the patient has consented to  Procedure(s): LEFT TOTAL KNEE ARTHROPLASTY (Left) as a surgical intervention .  The patient's history has been reviewed, patient examined, no change in status, stable for surgery.  I have reviewed the patient's chart and labs.  Questions were answered to the patient's satisfaction.     [name] A

## 2017-09-25 NOTE — Anesthesia Procedure Notes (Addendum)
Anesthesia Regional Block: Adductor canal block   Pre-Anesthetic Checklist: ,, timeout performed, Correct Patient, Correct Site, Correct Laterality, Correct Procedure, Correct Position, site marked, Risks and benefits discussed,  Surgical consent,  Pre-op evaluation,  At surgeon's request and post-op pain management  Laterality: Left  Prep: Betadine       Needles:  Injection technique: Single-shot  Needle Type: Echogenic Needle     Needle Length: 10cm  Needle Gauge: 21     Additional Needles:   Narrative:  Start time: 09/25/2017 8:32 AM End time: 09/25/2017 8:35 AM Injection made incrementally with aspirations every 5 mL.  Performed by: Personally  Anesthesiologist: Audry Pili, MD  Additional Notes: No pain on injection. No increased resistance to injection. Injection made in 5cc increments. Good needle visualization. Patient tolerated the procedure well.

## 2017-09-26 LAB — BASIC METABOLIC PANEL
ANION GAP: 7 (ref 5–15)
BUN: 8 mg/dL (ref 6–20)
CHLORIDE: 101 mmol/L (ref 101–111)
CO2: 30 mmol/L (ref 22–32)
Calcium: 8.6 mg/dL — ABNORMAL LOW (ref 8.9–10.3)
Creatinine, Ser: 0.76 mg/dL (ref 0.44–1.00)
GFR calc Af Amer: >60 mL/min (ref >60)
GFR calc non Af Amer: >60 mL/min (ref >60)
GLUCOSE: 118 mg/dL — AB (ref 65–99)
POTASSIUM: 3.6 mmol/L (ref 3.5–5.1)
Sodium: 138 mmol/L (ref 135–145)

## 2017-09-26 LAB — CBC
HEMATOCRIT: 35.3 % — AB (ref 36.0–46.0)
HEMOGLOBIN: 12.1 g/dL (ref 12.0–15.0)
MCH: 29.4 pg (ref 26.0–34.0)
MCHC: 34.3 g/dL (ref 30.0–36.0)
MCV: 85.7 fL (ref 78.0–100.0)
PLATELETS: 235 10*3/uL (ref 150–400)
RBC: 4.12 MIL/uL (ref 3.87–5.11)
RDW: 13.3 % (ref 11.5–15.5)
WBC: 8.8 10*3/uL (ref 4.0–10.5)

## 2017-09-26 MED ORDER — DIPHENHYDRAMINE HCL 50 MG/ML IJ SOLN
25.0000 mg | Freq: Three times a day (TID) | INTRAMUSCULAR | Status: DC | PRN
  Administered 2017-09-26 (×2): 25 mg via INTRAVENOUS
  Filled 2017-09-26 (×2): qty 1

## 2017-09-26 MED ORDER — OXYCODONE HCL 5 MG PO TABS
5.0000 mg | ORAL_TABLET | ORAL | Status: DC | PRN
  Administered 2017-09-26 – 2017-09-27 (×5): 15 mg via ORAL
  Administered 2017-09-27: 10 mg via ORAL
  Administered 2017-09-27: 15 mg via ORAL
  Filled 2017-09-26 (×8): qty 3

## 2017-09-26 MED ORDER — PROMETHAZINE HCL 25 MG PO TABS
25.0000 mg | ORAL_TABLET | Freq: Three times a day (TID) | ORAL | Status: DC | PRN
  Filled 2017-09-26: qty 1

## 2017-09-26 MED ORDER — DIPHENHYDRAMINE-ZINC ACETATE 2-0.1 % EX CREA
TOPICAL_CREAM | Freq: Three times a day (TID) | CUTANEOUS | Status: DC | PRN
  Filled 2017-09-26: qty 28

## 2017-09-26 NOTE — Progress Notes (Signed)
OT Cancellation Note  Patient Details Name: Katelyn [name] MRN: [id number] DOB: October 25, 1956   Cancelled Treatment:    Reason Eval/Treat Not Completed: Other (comment). Pt nauseous with PT. Will check tomorrow.  Shatisha Falter 09/26/2017, 1:38 PM  Lesle Chris, OTR/L [phone] 09/26/2017

## 2017-09-26 NOTE — Progress Notes (Signed)
Physical Therapy Treatment Patient Details Name: Katelyn [name] MRN: [id number] DOB: 03/22/1956 Today's Date: 09/26/2017    History of Present Illness Pt s/p L TKR and with hx of multiple allergies    PT Comments    Pt continues cooperative but ltd by pain/spasms - RN aware.   Follow Up Recommendations  DC plan and follow up therapy as arranged by surgeon     Equipment Recommendations  None recommended by PT    Recommendations for Other Services OT consult     Precautions / Restrictions Precautions Precautions: Knee;Fall Required Braces or Orthoses: Knee Immobilizer - Left Knee Immobilizer - Left: Discontinue once straight leg raise with < 10 degree lag Restrictions Weight Bearing Restrictions: No Other Position/Activity Restrictions: WBAT    Mobility  Bed Mobility Overal bed mobility: Needs Assistance Bed Mobility: Sit to Supine       Sit to supine: Min assist   General bed mobility comments: cues for sequence and use of R LE to self assist  Transfers Overall transfer level: Needs assistance Equipment used: Rolling walker (2 wheeled) Transfers: Sit to/from Stand Sit to Stand: Min assist         General transfer comment: cues for LE management and use of UEs to self assist  Ambulation/Gait Ambulation/Gait assistance: Min assist Ambulation Distance (Feet): 40 Feet(20' twice to/from bathroom) Assistive device: Rolling walker (2 wheeled) Gait Pattern/deviations: Step-to pattern;Decreased step length - right;Decreased step length - left;Shuffle;Trunk flexed Gait velocity: decr Gait velocity interpretation: Below normal speed for age/gender General Gait Details: cues for sequence, posture and position from RW.  INcreased time with multiple standing rest breaks required to complete task   Stairs            Wheelchair Mobility    Modified Rankin (Stroke Patients Only)       Balance Overall balance assessment: Needs assistance Sitting-balance  support: Feet supported;No upper extremity supported Sitting balance-Leahy Scale: Good     Standing balance support: Bilateral upper extremity supported Standing balance-Leahy Scale: Poor                              Cognition Arousal/Alertness: Awake/alert Behavior During Therapy: WFL for tasks assessed/performed Overall Cognitive Status: Within Functional Limits for tasks assessed                                        Exercises      General Comments        Pertinent Vitals/Pain Pain Assessment: 0-10 Pain Score: 10-Worst pain ever Pain Location: L knee Pain Descriptors / Indicators: Aching;Spasm;Throbbing Pain Intervention(s): Limited activity within patient's tolerance;Monitored during session;Premedicated before session;Ice applied    Home Living                      Prior Function            PT Goals (current goals can now be found in the care plan section) Acute Rehab PT Goals Patient Stated Goal: Regain IND and walk without pain PT Goal Formulation: With patient Time For Goal Achievement: 09/28/17 Potential to Achieve Goals: Good Progress towards PT goals: Progressing toward goals    Frequency    7X/week      PT Plan Current plan remains appropriate    Co-evaluation  AM-PAC PT "6 Clicks" Daily Activity  Outcome Measure  Difficulty turning over in bed (including adjusting bedclothes, sheets and blankets)?: Unable Difficulty moving from lying on back to sitting on the side of the bed? : Unable Difficulty sitting down on and standing up from a chair with arms (e.g., wheelchair, bedside commode, etc,.)?: Unable Help needed moving to and from a bed to chair (including a wheelchair)?: A Little Help needed walking in hospital room?: A Little Help needed climbing 3-5 steps with a railing? : A Lot 6 Click Score: 11    End of Session Equipment Utilized During Treatment: Gait belt;Left knee  immobilizer Activity Tolerance: Patient limited by pain;Patient limited by fatigue;Other (comment) Patient left: in bed;with call bell/phone within reach;with family/visitor present Nurse Communication: Mobility status PT Visit Diagnosis: Unsteadiness on feet (R26.81);Difficulty in walking, not elsewhere classified (R26.2);Pain Pain - Right/Left: Left Pain - part of body: Knee     Time: 1344-1406 PT Time Calculation (min) (ACUTE ONLY): 22 min  Charges:  $Gait Training: 8-22 mins                    G Codes:       Pg [phone]    Toma Arts 09/26/2017, 4:22 PM

## 2017-09-26 NOTE — Progress Notes (Signed)
Subjective: 1 Day Post-Op Procedure(s) (LRB): LEFT TOTAL KNEE ARTHROPLASTY (Left) Patient reports pain as 3 on 0-10 scale.Doing well except for itching. She has multipl allergies. Hemovac DCd and wound looks excellent.    Objective: Vital signs in last 24 hours: Temp:  [97.6 F (36.4 C)-98.7 F (37.1 C)] 98.4 F (36.9 C) (11/15 0512) Pulse Rate:  [65-92] 87 (11/15 0512) Resp:  [10-25] 18 (11/15 0512) BP: (110-159)/(62-84) 125/65 (11/15 0512) SpO2:  [94 %-100 %] 96 % (11/15 0512)  Intake/Output from previous day: 11/14 0701 - 11/15 0700 In: 5191.3 [P.O.:960; I.V.:3921.3; IV Piggyback:310] Out: 3240 [Urine:3050; Drains:140; Blood:50] Intake/Output this shift: No intake/output data recorded.  Recent Labs    09/26/17 0519  HGB 12.1   Recent Labs    09/26/17 0519  WBC 8.8  RBC 4.12  HCT 35.3*  PLT 235   Recent Labs    09/26/17 0519  NA 138  K 3.6  CL 101  CO2 30  BUN 8  CREATININE 0.76  GLUCOSE 118*  CALCIUM 8.6*   No results for input(s): LABPT, INR in the last 72 hours.  Dorsiflexion/Plantar flexion intact No cellulitis present Compartment soft  Assessment/Plan: 1 Day Post-Op Procedure(s) (LRB): LEFT TOTAL KNEE ARTHROPLASTY (Left) Up with therapy  Ralpheal Zappone A 09/26/2017, 7:30 AM

## 2017-09-26 NOTE — Evaluation (Signed)
Physical Therapy Evaluation Patient Details Name: Katelyn [name] MRN: [id number] DOB: 29-Mar-1956 Today's Date: 09/26/2017   History of Present Illness  Pt s/p L TKR and with hx of multiple allergies  Clinical Impression  Pt s/p L TKR and presents with decreased L LE strength/ROM and post op pain limiting functional mobility.  Pt should progress to dc home with family assist.    Follow Up Recommendations DC plan and follow up therapy as arranged by surgeon(Pt states wants OP PT but has no appt scheduled)    Equipment Recommendations  None recommended by PT    Recommendations for Other Services OT consult     Precautions / Restrictions Precautions Precautions: Knee;Fall Required Braces or Orthoses: Knee Immobilizer - Left Knee Immobilizer - Left: Discontinue once straight leg raise with < 10 degree lag Restrictions Weight Bearing Restrictions: No Other Position/Activity Restrictions: WBAT      Mobility  Bed Mobility Overal bed mobility: Needs Assistance Bed Mobility: Supine to Sit     Supine to sit: Min assist     General bed mobility comments: cues for sequence and use of R LE to self assist  Transfers Overall transfer level: Needs assistance Equipment used: Rolling walker (2 wheeled) Transfers: Sit to/from Stand Sit to Stand: Min assist         General transfer comment: cues for LE management and use of UEs to self assist  Ambulation/Gait Ambulation/Gait assistance: Min assist Ambulation Distance (Feet): 65 Feet Assistive device: Rolling walker (2 wheeled) Gait Pattern/deviations: Step-to pattern;Decreased step length - right;Decreased step length - left;Shuffle;Trunk flexed Gait velocity: decr Gait velocity interpretation: Below normal speed for age/gender General Gait Details: cues for sequence, posture and position from RW.  INcreased time with multiple standing rest breaks required to complete task  Stairs            Wheelchair Mobility     Modified Rankin (Stroke Patients Only)       Balance Overall balance assessment: Needs assistance Sitting-balance support: Feet supported;No upper extremity supported Sitting balance-Leahy Scale: Fair     Standing balance support: Bilateral upper extremity supported Standing balance-Leahy Scale: Poor                               Pertinent Vitals/Pain Pain Assessment: 0-10 Pain Score: 8  Pain Location: L knee Pain Descriptors / Indicators: Aching;Spasm;Sore Pain Intervention(s): Limited activity within patient's tolerance;Monitored during session;Premedicated before session;Ice applied;Patient requesting pain meds-RN notified    Home Living Family/patient expects to be discharged to:: Private residence Living Arrangements: Spouse/significant other Available Help at Discharge: Family Type of Home: House Home Access: Stairs to enter Entrance Stairs-Rails: Psychiatric nurse of Steps: 4 Home Layout: One level Home Equipment: Environmental consultant - 2 wheels;Walker - 4 wheels;Cane - single point;Crutches      Prior Function Level of Independence: Independent with assistive device(s);Independent         Comments: using crutches since scope earlier this year     Hand Dominance        Extremity/Trunk Assessment   Upper Extremity Assessment Upper Extremity Assessment: Overall WFL for tasks assessed    Lower Extremity Assessment Lower Extremity Assessment: LLE deficits/detail    Cervical / Trunk Assessment Cervical / Trunk Assessment: Normal  Communication   Communication: No difficulties  Cognition Arousal/Alertness: Awake/alert Behavior During Therapy: WFL for tasks assessed/performed Overall Cognitive Status: Within Functional Limits for tasks assessed  General Comments      Exercises Total Joint Exercises Ankle Circles/Pumps: AROM;Both;15 reps;Supine   Assessment/Plan    PT  Assessment Patient needs continued PT services  PT Problem List Decreased strength;Decreased range of motion;Decreased activity tolerance;Decreased balance;Decreased mobility;Decreased knowledge of use of DME;Pain       PT Treatment Interventions DME instruction;Gait training;Stair training;Functional mobility training;Therapeutic activities;Therapeutic exercise;Patient/family education    PT Goals (Current goals can be found in the Care Plan section)  Acute Rehab PT Goals Patient Stated Goal: Regain IND and walk without pain PT Goal Formulation: With patient Time For Goal Achievement: 09/28/17 Potential to Achieve Goals: Good    Frequency 7X/week   Barriers to discharge        Co-evaluation               AM-PAC PT "6 Clicks" Daily Activity  Outcome Measure Difficulty turning over in bed (including adjusting bedclothes, sheets and blankets)?: Unable Difficulty moving from lying on back to sitting on the side of the bed? : Unable Difficulty sitting down on and standing up from a chair with arms (e.g., wheelchair, bedside commode, etc,.)?: Unable Help needed moving to and from a bed to chair (including a wheelchair)?: A Lot Help needed walking in hospital room?: A Lot Help needed climbing 3-5 steps with a railing? : A Lot 6 Click Score: 9    End of Session Equipment Utilized During Treatment: Gait belt;Left knee immobilizer Activity Tolerance: Patient limited by pain;Patient limited by fatigue;Other (comment)(nausea) Patient left: in chair;with call bell/phone within reach;with family/visitor present Nurse Communication: Mobility status PT Visit Diagnosis: Unsteadiness on feet (R26.81);Difficulty in walking, not elsewhere classified (R26.2);Pain Pain - Right/Left: Left Pain - part of body: Knee    Time: 1610-9604 PT Time Calculation (min) (ACUTE ONLY): 27 min   Charges:   PT Evaluation $PT Eval Low Complexity: 1 Low PT Treatments $Gait Training: 8-22 mins   PT G  Codes:        Pg [phone]   Julee Stoll 09/26/2017, 12:37 PM

## 2017-09-26 NOTE — Progress Notes (Signed)
Discharge planning, no HH needs identified. Plan for OP PT, spouse is still looking for but feels have DME at home. Will f/u if he cannot find. [phone]

## 2017-09-27 LAB — BASIC METABOLIC PANEL
ANION GAP: 6 (ref 5–15)
BUN: 10 mg/dL (ref 6–20)
CHLORIDE: 97 mmol/L — AB (ref 101–111)
CO2: 31 mmol/L (ref 22–32)
Calcium: 8.3 mg/dL — ABNORMAL LOW (ref 8.9–10.3)
Creatinine, Ser: 0.85 mg/dL (ref 0.44–1.00)
GFR calc Af Amer: >60 mL/min (ref >60)
GFR calc non Af Amer: >60 mL/min (ref >60)
GLUCOSE: 106 mg/dL — AB (ref 65–99)
POTASSIUM: 3.1 mmol/L — AB (ref 3.5–5.1)
Sodium: 134 mmol/L — ABNORMAL LOW (ref 135–145)

## 2017-09-27 LAB — CBC
HCT: 36.1 % (ref 36.0–46.0)
Hemoglobin: 11.9 g/dL — ABNORMAL LOW (ref 12.0–15.0)
MCH: 28.9 pg (ref 26.0–34.0)
MCHC: 33 g/dL (ref 30.0–36.0)
MCV: 87.6 fL (ref 78.0–100.0)
Platelets: 194 10*3/uL (ref 150–400)
RBC: 4.12 MIL/uL (ref 3.87–5.11)
RDW: 13.7 % (ref 11.5–15.5)
WBC: 7.3 10*3/uL (ref 4.0–10.5)

## 2017-09-27 MED ORDER — PROMETHAZINE HCL 25 MG PO TABS: 25.0000 mg | ORAL_TABLET | Freq: Three times a day (TID) | ORAL | 0 refills | Status: DC | PRN

## 2017-09-27 MED ORDER — ASPIRIN EC 325 MG PO TBEC: 325.0000 mg | DELAYED_RELEASE_TABLET | Freq: Two times a day (BID) | ORAL | 0 refills | Status: DC

## 2017-09-27 NOTE — Evaluation (Signed)
Occupational Therapy Evaluation Patient Details Name: Katelyn [name] MRN: [id number] DOB: 1956-03-30 Today's Date: 09/27/2017    History of Present Illness Pt s/p L TKR and with hx of multiple allergies   Clinical Impression   Pt was admitted for the above sx.  All education was completed. Pt is very independent natured and cued for safety so that she will not fall.  Pt verbalizes all education    Follow Up Recommendations  Supervision/Assistance - 24 hour    Equipment Recommendations  3 in 1 bedside commode    Recommendations for Other Services       Precautions / Restrictions Precautions Precautions: Knee;Fall Precaution Comments: reviewed importance of no pillow under knee Required Braces or Orthoses: Knee Immobilizer - Left Knee Immobilizer - Left: Discontinue once straight leg raise with < 10 degree lag Restrictions Weight Bearing Restrictions: No Other Position/Activity Restrictions: WBAT      Mobility Bed Mobility Overal bed mobility: Needs Assistance Bed Mobility: Supine to Sit     Supine to sit: Supervision;Min guard     General bed mobility comments: oob by PT  Transfers Overall transfer level: Needs assistance Equipment used: Rolling walker (2 wheeled) Transfers: Sit to/from Stand Sit to Stand: Min guard         General transfer comment: cues for LE management and use of UEs to self assist    Balance                                           ADL either performed or assessed with clinical judgement   ADL Overall ADL's : Needs assistance/impaired Eating/Feeding: Independent   Grooming: Set up;Sitting   Upper Body Bathing: Set up;Sitting   Lower Body Bathing: Minimal assistance;Sit to/from stand   Upper Body Dressing : Set up;Sitting   Lower Body Dressing: Moderate assistance;Sit to/from stand   Toilet Transfer: Min guard;Stand-pivot;RW(chair to bed to chair)   Toileting- Water quality scientist and Hygiene: Min  guard;Sit to/from stand         General ADL Comments: Perfomred dressing. reviewed knee precautions, use of KI, sidestepping through tight spaces and sequence for tub bench.  Pt has a Secondary school teacher; she wants to be as independent as possible. educated on it's use.  Did not use mine as I'm not sure it is latex free and she has an allergy to latex.  Pt attempted to reach for own clothes on bed. Cued to either get closer or have assistance so that she doesn't over-reach, get off balance and fall     Vision         Perception     Praxis      Pertinent Vitals/Pain Pain Assessment: 0-10 Pain Score: 5  Pain Location: L knee Pain Descriptors / Indicators: Aching;Sore Pain Intervention(s): Limited activity within patient's tolerance;Monitored during session;Repositioned;Premedicated before session(with c/o nausea)     Hand Dominance     Extremity/Trunk Assessment Upper Extremity Assessment Upper Extremity Assessment: Overall WFL for tasks assessed           Communication Communication Communication: No difficulties   Cognition Arousal/Alertness: Awake/alert Behavior During Therapy: WFL for tasks assessed/performed Overall Cognitive Status: Within Functional Limits for tasks assessed                                 General Comments: decreased  safety at times; pt is very independent natured   General Comments       Exercises Total Joint Exercises Ankle Circles/Pumps: AROM;Both;10 reps Quad Sets: AROM;Both;10 reps Straight Leg Raises: AAROM;Left;10 reps   Shoulder Instructions      Home Living Family/patient expects to be discharged to:: Private residence Living Arrangements: Spouse/significant other Available Help at Discharge: Family               Bathroom Shower/Tub: Teacher, early years/pre: Standard     Home Equipment: Tub bench   Additional Comments: 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      Prior  Functioning/Environment Level of Independence: Independent with assistive device(s);Independent                 OT Problem List:        OT Treatment/Interventions:      OT Goals(Current goals can be found in the care plan section) Acute Rehab OT Goals Patient Stated Goal: Regain IND and walk without pain OT Goal Formulation: All assessment and education complete, DC therapy  OT Frequency:     Barriers to D/C:            Co-evaluation              AM-PAC PT "6 Clicks" Daily Activity     Outcome Measure Help from another person eating meals?: None Help from another person taking care of personal grooming?: A Little Help from another person toileting, which includes using toliet, bedpan, or urinal?: A Little Help from another person bathing (including washing, rinsing, drying)?: A Little Help from another person to put on and taking off regular upper body clothing?: A Little Help from another person to put on and taking off regular lower body clothing?: A Lot 6 Click Score: 18   End of Session    Activity Tolerance: Patient tolerated treatment well Patient left: in chair;with call bell/phone within reach;with family/visitor present  OT Visit Diagnosis: Pain Pain - Right/Left: Left Pain - part of body: Knee                Time: 1540-0867 OT Time Calculation (min): 21 min Charges:  OT General Charges $OT Visit: 1 Visit OT Evaluation $OT Eval Low Complexity: 1 Low G-Codes:     [place], OTR/L [phone] 09/27/2017  Estelene Carmack 09/27/2017, 12:56 PM

## 2017-09-27 NOTE — Progress Notes (Signed)
Subjective: 2 Days Post-Op Procedure(s) (LRB): LEFT TOTAL KNEE ARTHROPLASTY (Left) Patient reports pain as 2 on 0-10 scale.Dressing changed and wound is fine. Will DC and see her in 2-weeks.    Objective: Vital signs in last 24 hours: Temp:  [99.3 F (37.4 C)-101.2 F (38.4 C)] 99.5 F (37.5 C) (11/16 0653) Pulse Rate:  [91-118] 109 (11/16 0542) Resp:  [13-17] 16 (11/16 0542) BP: (119-139)/(68-77) 139/77 (11/16 0542) SpO2:  [94 %-98 %] 94 % (11/16 0542) FiO2 (%):  [92 %] 92 % (11/15 1138)  Intake/Output from previous day: 11/15 0701 - 11/16 0700 In: 540 [P.O.:240; I.V.:300] Out: 150 [Urine:150] Intake/Output this shift: No intake/output data recorded.  Recent Labs    09/26/17 0519 09/27/17 0524  HGB 12.1 11.9*   Recent Labs    09/26/17 0519 09/27/17 0524  WBC 8.8 7.3  RBC 4.12 4.12  HCT 35.3* 36.1  PLT 235 194   Recent Labs    09/26/17 0519 09/27/17 0524  NA 138 134*  K 3.6 3.1*  CL 101 97*  CO2 30 31  BUN 8 10  CREATININE 0.76 0.85  GLUCOSE 118* 106*  CALCIUM 8.6* 8.3*   No results for input(s): LABPT, INR in the last 72 hours.  Dorsiflexion/Plantar flexion intact No cellulitis present Compartment soft  Assessment/Plan: 2 Days Post-Op Procedure(s) (LRB): LEFT TOTAL KNEE ARTHROPLASTY (Left) Discharge home with home health  Latanya Maudlin 09/27/2017, 7:27 AM

## 2017-09-27 NOTE — Progress Notes (Signed)
Physical Therapy Treatment Patient Details Name: Katelyn [name] MRN: [id number] DOB: 01-Jan-1956 Today's Date: 09/27/2017    History of Present Illness Pt s/p L TKR and with hx of multiple allergies    PT Comments    Pt is moving very well but sleepy and with intermittent nausea and dizziness with mobility (dizziness does not change or worsen); BP 128/71; pt husband present for PT session and is supportive, assists and cues pt correctly   Follow Up Recommendations  DC plan and follow up therapy as arranged by surgeon     Equipment Recommendations  None recommended by PT    Recommendations for Other Services       Precautions / Restrictions Precautions Precautions: Knee;Fall Precaution Comments: reviewed importance of no pillow under knee Required Braces or Orthoses: Knee Immobilizer - Left Knee Immobilizer - Left: Discontinue once straight leg raise with < 10 degree lag Restrictions Weight Bearing Restrictions: No Other Position/Activity Restrictions: WBAT    Mobility  Bed Mobility Overal bed mobility: Needs Assistance Bed Mobility: Supine to Sit     Supine to sit: Supervision;Min guard     General bed mobility comments: cues for sequence and use of R LE to self assist  Transfers Overall transfer level: Needs assistance Equipment used: Rolling walker (2 wheeled) Transfers: Sit to/from Stand Sit to Stand: Min guard         General transfer comment: cues for LE management and use of UEs to self assist  Ambulation/Gait Ambulation/Gait assistance: Min guard Ambulation Distance (Feet): 60 Feet Assistive device: Rolling walker (2 wheeled) Gait Pattern/deviations: Step-to pattern;Decreased step length - right;Decreased step length - left;Decreased weight shift to left;Trunk flexed Gait velocity: decr Gait velocity interpretation: Below normal speed for age/gender General Gait Details: cues for sequence, posture and position from RW.  INcreased time with multiple  standing rest breaks required to complete task   Stairs Stairs: Yes   Stair Management: One rail Right;Step to pattern;Sideways Number of Stairs: 3 General stair comments: cues for sequence  and safe technique; husband present for stair training  Wheelchair Mobility    Modified Rankin (Stroke Patients Only)       Balance                                            Cognition Arousal/Alertness: Awake/alert Behavior During Therapy: WFL for tasks assessed/performed Overall Cognitive Status: Within Functional Limits for tasks assessed                                        Exercises Total Joint Exercises Ankle Circles/Pumps: AROM;Both;10 reps Quad Sets: AROM;Both;10 reps Straight Leg Raises: AAROM;Left;10 reps    General Comments        Pertinent Vitals/Pain Pain Assessment: 0-10 Pain Score: 5  Pain Location: L knee Pain Descriptors / Indicators: Aching;Sore Pain Intervention(s): Limited activity within patient's tolerance;Monitored during session;Premedicated before session(declines ice)    Home Living                      Prior Function            PT Goals (current goals can now be found in the care plan section) Acute Rehab PT Goals Patient Stated Goal: Regain IND and walk without pain PT Goal  Formulation: With patient Time For Goal Achievement: 09/28/17 Potential to Achieve Goals: Good Progress towards PT goals: Progressing toward goals    Frequency    7X/week      PT Plan Current plan remains appropriate    Co-evaluation              AM-PAC PT "6 Clicks" Daily Activity  Outcome Measure  Difficulty turning over in bed (including adjusting bedclothes, sheets and blankets)?: Unable Difficulty moving from lying on back to sitting on the side of the bed? : A Little Difficulty sitting down on and standing up from a chair with arms (e.g., wheelchair, bedside commode, etc,.)?: A Little Help needed  moving to and from a bed to chair (including a wheelchair)?: A Little Help needed walking in hospital room?: A Little Help needed climbing 3-5 steps with a railing? : A Little 6 Click Score: 16    End of Session Equipment Utilized During Treatment: Gait belt;Left knee immobilizer Activity Tolerance: Patient tolerated treatment well Patient left: in chair;with call bell/phone within reach;with family/visitor present Nurse Communication: Mobility status PT Visit Diagnosis: Unsteadiness on feet (R26.81);Difficulty in walking, not elsewhere classified (R26.2);Pain Pain - Right/Left: Left Pain - part of body: Knee     Time: 0921-1000 PT Time Calculation (min) (ACUTE ONLY): 39 min  Charges:  $Gait Training: 23-37 mins $Therapeutic Activity: 8-22 mins                    G Codes:          Jaylene Schrom 10/04/2017, 11:14 AM

## 2017-09-28 ENCOUNTER — Observation Stay (HOSPITAL_COMMUNITY)
Admission: EM | Admit: 2017-09-28 | Discharge: 2017-09-30 | Disposition: A | Discharge status: Home / Self Care | Payer: 59 | Attending: Nephrology | Admitting: Nephrology

## 2017-09-28 ENCOUNTER — Other Ambulatory Visit: Payer: Self-pay

## 2017-09-28 ENCOUNTER — Emergency Department (HOSPITAL_COMMUNITY): Payer: 59

## 2017-09-28 ENCOUNTER — Encounter (HOSPITAL_COMMUNITY): Payer: Self-pay | Admitting: Emergency Medicine

## 2017-09-28 DIAGNOSIS — Z79899 Other long term (current) drug therapy: Secondary | ICD-10-CM | POA: Diagnosis not present

## 2017-09-28 DIAGNOSIS — R5082 Postprocedural fever: Secondary | ICD-10-CM | POA: Diagnosis not present

## 2017-09-28 DIAGNOSIS — Z881 Allergy status to other antibiotic agents status: Secondary | ICD-10-CM | POA: Insufficient documentation

## 2017-09-28 DIAGNOSIS — Z8719 Personal history of other diseases of the digestive system: Secondary | ICD-10-CM | POA: Insufficient documentation

## 2017-09-28 DIAGNOSIS — R404 Transient alteration of awareness: Secondary | ICD-10-CM

## 2017-09-28 DIAGNOSIS — Z882 Allergy status to sulfonamides status: Secondary | ICD-10-CM | POA: Diagnosis not present

## 2017-09-28 DIAGNOSIS — J029 Acute pharyngitis, unspecified: Secondary | ICD-10-CM | POA: Diagnosis not present

## 2017-09-28 DIAGNOSIS — R0902 Hypoxemia: Secondary | ICD-10-CM | POA: Diagnosis not present

## 2017-09-28 DIAGNOSIS — Z7982 Long term (current) use of aspirin: Secondary | ICD-10-CM | POA: Diagnosis not present

## 2017-09-28 DIAGNOSIS — E039 Hypothyroidism, unspecified: Secondary | ICD-10-CM | POA: Diagnosis present

## 2017-09-28 DIAGNOSIS — Z886 Allergy status to analgesic agent status: Secondary | ICD-10-CM | POA: Diagnosis not present

## 2017-09-28 DIAGNOSIS — J9811 Atelectasis: Secondary | ICD-10-CM | POA: Diagnosis not present

## 2017-09-28 DIAGNOSIS — Z880 Allergy status to penicillin: Secondary | ICD-10-CM | POA: Insufficient documentation

## 2017-09-28 DIAGNOSIS — I10 Essential (primary) hypertension: Secondary | ICD-10-CM | POA: Insufficient documentation

## 2017-09-28 DIAGNOSIS — E871 Hypo-osmolality and hyponatremia: Secondary | ICD-10-CM | POA: Insufficient documentation

## 2017-09-28 DIAGNOSIS — R509 Fever, unspecified: Secondary | ICD-10-CM | POA: Diagnosis not present

## 2017-09-28 DIAGNOSIS — Z96652 Presence of left artificial knee joint: Secondary | ICD-10-CM | POA: Diagnosis not present

## 2017-09-28 DIAGNOSIS — G43909 Migraine, unspecified, not intractable, without status migrainosus: Secondary | ICD-10-CM | POA: Insufficient documentation

## 2017-09-28 DIAGNOSIS — R000 Tachycardia, unspecified: Secondary | ICD-10-CM | POA: Diagnosis not present

## 2017-09-28 DIAGNOSIS — Z87442 Personal history of urinary calculi: Secondary | ICD-10-CM | POA: Insufficient documentation

## 2017-09-28 DIAGNOSIS — E876 Hypokalemia: Secondary | ICD-10-CM | POA: Diagnosis present

## 2017-09-28 DIAGNOSIS — D649 Anemia, unspecified: Secondary | ICD-10-CM | POA: Diagnosis not present

## 2017-09-28 DIAGNOSIS — Z91040 Latex allergy status: Secondary | ICD-10-CM | POA: Diagnosis not present

## 2017-09-28 DIAGNOSIS — Z888 Allergy status to other drugs, medicaments and biological substances status: Secondary | ICD-10-CM | POA: Insufficient documentation

## 2017-09-28 DIAGNOSIS — R001 Bradycardia, unspecified: Secondary | ICD-10-CM | POA: Diagnosis not present

## 2017-09-28 LAB — COMPREHENSIVE METABOLIC PANEL
ALBUMIN: 3.1 g/dL — AB (ref 3.5–5.0)
ALT: 22 U/L (ref 14–54)
AST: 19 U/L (ref 15–41)
Alkaline Phosphatase: 64 U/L (ref 38–126)
Anion gap: 9 (ref 5–15)
BUN: 9 mg/dL (ref 6–20)
CHLORIDE: 96 mmol/L — AB (ref 101–111)
CO2: 27 mmol/L (ref 22–32)
Calcium: 8.1 mg/dL — ABNORMAL LOW (ref 8.9–10.3)
Creatinine, Ser: 0.81 mg/dL (ref 0.44–1.00)
GFR calc Af Amer: >60 mL/min (ref >60)
GFR calc non Af Amer: >60 mL/min (ref >60)
GLUCOSE: 124 mg/dL — AB (ref 65–99)
POTASSIUM: 2.9 mmol/L — AB (ref 3.5–5.1)
SODIUM: 132 mmol/L — AB (ref 135–145)
Total Bilirubin: 1 mg/dL (ref 0.3–1.2)
Total Protein: 6.2 g/dL — ABNORMAL LOW (ref 6.5–8.1)

## 2017-09-28 LAB — AMMONIA: AMMONIA: 14 umol/L (ref 9–35)

## 2017-09-28 LAB — BLOOD GAS, VENOUS
ACID-BASE DEFICIT: 6.1 mmol/L — AB (ref 0.0–2.0)
BICARBONATE: 17.5 mmol/L — AB (ref 20.0–28.0)
O2 Saturation: 80 %
PH VEN: 7.394 (ref 7.250–7.430)
Patient temperature: 98.6
pCO2, Ven: 29.3 mmHg — ABNORMAL LOW (ref 44.0–60.0)
pO2, Ven: 47.9 mmHg — ABNORMAL HIGH (ref 32.0–45.0)

## 2017-09-28 LAB — I-STAT CG4 LACTIC ACID, ED
LACTIC ACID, VENOUS: 1.05 mmol/L (ref 0.5–1.9)
Lactic Acid, Venous: 0.36 mmol/L — ABNORMAL LOW (ref 0.5–1.9)

## 2017-09-28 LAB — CBC WITH DIFFERENTIAL/PLATELET
Basophils Absolute: 0 10*3/uL (ref 0.0–0.1)
Basophils Relative: 0 %
Eosinophils Absolute: 0.1 10*3/uL (ref 0.0–0.7)
Eosinophils Relative: 1 %
HEMATOCRIT: 33.6 % — AB (ref 36.0–46.0)
HEMOGLOBIN: 11.3 g/dL — AB (ref 12.0–15.0)
LYMPHS ABS: 0.9 10*3/uL (ref 0.7–4.0)
LYMPHS PCT: 10 %
MCH: 29.1 pg (ref 26.0–34.0)
MCHC: 33.6 g/dL (ref 30.0–36.0)
MCV: 86.6 fL (ref 78.0–100.0)
Monocytes Absolute: 1 10*3/uL (ref 0.1–1.0)
Monocytes Relative: 10 %
Neutro Abs: 7.3 10*3/uL (ref 1.7–7.7)
Neutrophils Relative %: 79 %
Platelets: 207 10*3/uL (ref 150–400)
RBC: 3.88 MIL/uL (ref 3.87–5.11)
RDW: 13.5 % (ref 11.5–15.5)
WBC: 9.2 10*3/uL (ref 4.0–10.5)

## 2017-09-28 LAB — URINALYSIS, ROUTINE W REFLEX MICROSCOPIC
BILIRUBIN URINE: NEGATIVE
Bacteria, UA: NONE SEEN
Glucose, UA: NEGATIVE mg/dL
KETONES UR: 20 mg/dL — AB
Leukocytes, UA: NEGATIVE
Nitrite: NEGATIVE
PH: 6 (ref 5.0–8.0)
Protein, ur: NEGATIVE mg/dL
Specific Gravity, Urine: 1.01 (ref 1.005–1.030)

## 2017-09-28 LAB — INFLUENZA PANEL BY PCR (TYPE A & B)
INFLBPCR: NEGATIVE
Influenza A By PCR: NEGATIVE

## 2017-09-28 LAB — RAPID STREP SCREEN (MED CTR MEBANE ONLY): Streptococcus, Group A Screen (Direct): NEGATIVE

## 2017-09-28 MED ORDER — POTASSIUM CHLORIDE IN NACL 20-0.9 MEQ/L-% IV SOLN
INTRAVENOUS | Status: DC
  Administered 2017-09-28 – 2017-09-29 (×2): via INTRAVENOUS
  Filled 2017-09-28 (×2): qty 1000

## 2017-09-28 MED ORDER — LEVOTHYROXINE SODIUM 150 MCG PO TABS
150.0000 ug | ORAL_TABLET | Freq: Every day | ORAL | Status: DC
  Administered 2017-09-28 – 2017-09-30 (×3): 150 ug via ORAL
  Filled 2017-09-28: qty 1
  Filled 2017-09-28: qty 2
  Filled 2017-09-28: qty 1
  Filled 2017-09-28: qty 2
  Filled 2017-09-28: qty 1

## 2017-09-28 MED ORDER — SODIUM CHLORIDE 0.9 % IV BOLUS (SEPSIS)
1000.0000 mL | Freq: Once | INTRAVENOUS | Status: AC
  Administered 2017-09-28: 1000 mL via INTRAVENOUS

## 2017-09-28 MED ORDER — ONDANSETRON HCL 4 MG PO TABS: 4.0000 mg | ORAL_TABLET | Freq: Four times a day (QID) | ORAL | Status: DC | PRN

## 2017-09-28 MED ORDER — IOPAMIDOL (ISOVUE-370) INJECTION 76%
100.0000 mL | Freq: Once | INTRAVENOUS | Status: AC | PRN
  Administered 2017-09-28: 100 mL via INTRAVENOUS

## 2017-09-28 MED ORDER — MIRABEGRON ER 25 MG PO TB24
25.0000 mg | ORAL_TABLET | Freq: Every day | ORAL | Status: DC
  Administered 2017-09-29 – 2017-09-30 (×2): 25 mg via ORAL
  Filled 2017-09-28 (×2): qty 1

## 2017-09-28 MED ORDER — ENOXAPARIN SODIUM 40 MG/0.4ML ~~LOC~~ SOLN
40.0000 mg | SUBCUTANEOUS | Status: DC
  Filled 2017-09-28 (×3): qty 0.4

## 2017-09-28 MED ORDER — POTASSIUM CHLORIDE 10 MEQ/100ML IV SOLN
10.0000 meq | INTRAVENOUS | Status: AC
  Administered 2017-09-28 (×4): 10 meq via INTRAVENOUS
  Filled 2017-09-28 (×4): qty 100

## 2017-09-28 MED ORDER — POTASSIUM CHLORIDE CRYS ER 20 MEQ PO TBCR
40.0000 meq | EXTENDED_RELEASE_TABLET | Freq: Once | ORAL | Status: DC
  Filled 2017-09-28: qty 2

## 2017-09-28 MED ORDER — ACETAMINOPHEN 650 MG RE SUPP: 650.0000 mg | Freq: Four times a day (QID) | RECTAL | Status: DC | PRN

## 2017-09-28 MED ORDER — POTASSIUM CHLORIDE 10 MEQ/100ML IV SOLN
10.0000 meq | INTRAVENOUS | Status: AC
  Administered 2017-09-28 (×3): 10 meq via INTRAVENOUS
  Filled 2017-09-28: qty 100

## 2017-09-28 MED ORDER — OXYCODONE HCL 5 MG PO TABS
5.0000 mg | ORAL_TABLET | Freq: Four times a day (QID) | ORAL | Status: DC | PRN
  Administered 2017-09-29: 5 mg via ORAL
  Filled 2017-09-28: qty 1

## 2017-09-28 MED ORDER — ONDANSETRON HCL 4 MG/2ML IJ SOLN: 4.0000 mg | Freq: Four times a day (QID) | INTRAMUSCULAR | Status: DC | PRN

## 2017-09-28 MED ORDER — ACETAMINOPHEN 325 MG PO TABS
650.0000 mg | ORAL_TABLET | Freq: Four times a day (QID) | ORAL | Status: DC | PRN
  Administered 2017-09-29: 650 mg via ORAL
  Filled 2017-09-28: qty 2

## 2017-09-28 MED ORDER — IOPAMIDOL (ISOVUE-370) INJECTION 76%
INTRAVENOUS | Status: AC
  Filled 2017-09-28: qty 100

## 2017-09-28 MED ORDER — MAGNESIUM OXIDE 400 (241.3 MG) MG PO TABS
800.0000 mg | ORAL_TABLET | Freq: Once | ORAL | Status: DC
  Filled 2017-09-28: qty 2

## 2017-09-28 NOTE — ED Notes (Signed)
Pt unable to have cardiac monitoring because she is allergic to the adhesive.

## 2017-09-28 NOTE — ED Notes (Signed)
Patient put on 2 liter O2 due to O2 Sats 86% on room air. MD notified.

## 2017-09-28 NOTE — ED Notes (Signed)
Patient transported to X-ray 

## 2017-09-28 NOTE — ED Notes (Signed)
Pt to xray

## 2017-09-28 NOTE — Progress Notes (Signed)
Subjective:    Patient reports pain as 2 on 0-10 scale.    Objective: Vital signs in last 24 hours: Temp:  [98.1 F (36.7 C)-98.7 F (37.1 C)] 98.1 F (36.7 C) (11/17 0912) Pulse Rate:  [99-107] 99 (11/17 0912) Resp:  [16-18] 18 (11/17 0912) BP: (110-132)/(55-73) 132/55 (11/17 0912) SpO2:  [86 %-100 %] 100 % (11/17 0912) Weight:  [78.5 kg (173 lb)] 78.5 kg (173 lb) (11/17 0341)  Intake/Output from previous day: 11/16 0701 - 11/17 0700 In: 1000 [IV Piggyback:1000] Out: -  Intake/Output this shift: Total I/O In: 100 [IV Piggyback:100] Out: -   Recent Labs    09/26/17 0519 09/27/17 0524 09/28/17 0126  HGB 12.1 11.9* 11.3*   Recent Labs    09/27/17 0524 09/28/17 0126  WBC 7.3 9.2  RBC 4.12 3.88  HCT 36.1 33.6*  PLT 194 207   Recent Labs    09/27/17 0524 09/28/17 0126  NA 134* 132*  K 3.1* 2.9*  CL 97* 96*  CO2 31 27  BUN 10 9  CREATININE 0.85 0.81  GLUCOSE 106* 124*  CALCIUM 8.3* 8.1*   No results for input(s): LABPT, INR in the last 72 hours.  Dorsiflexion/Plantar flexion intact No cellulitis present Compartment soft  Assessment/Plan:    Dressing changed and wound looks fine.Low K.  Latanya Maudlin 09/28/2017, 12:41 PM

## 2017-09-28 NOTE — ED Notes (Signed)
Bed: DU20 Expected date:  Expected time:  Means of arrival:  Comments: EMS 61 yo female-discharged yesterday after knee replacement-disoriented-fever 103

## 2017-09-28 NOTE — Consult Note (Signed)
ORTHOPAEDIC CONSULTATION  REQUESTING PHYSICIAN: Bonnielee Haff, MD  PCP:  Marletta Lor, MD  Chief Complaint: Postoperative fever and mental status changes.  HPI: Katelyn [name] is a 61 y.o. female who is now about 2 days status post left total knee arthroplasty by my partner Dr. Gladstone Lighter.  She was discharged from the hospital yesterday afternoon.  She states she had a low-grade fever at that point of 100.3.  She also had some somnolence per her husband.  When she returned to her house yesterday afternoon she continued to have hypersomnolence.  She was arousable but not very easily awakened.  She had a fever of 103 F.  Her husband called EMS due to concerns over infection.  She presented to the emergency department early this morning.  On her emergency department workup there was not a high index of suspicion for postop surgical site infection however she had some fever of unknown origin with electrolyte abnormalities that would benefit from a hospitalist admission.  We were consulted for close follow-up of her surgical site.  Past Medical History:  Diagnosis Date  . Anemia   . Headache(784.0)    cluster headaches, migraines- states is taking Atenolol at present as directed per Dr Lacinda Axon  to prevent migraine from increased stress-  states if on medication for several weeks that heart rate will drop to 40's  . History of kidney stones   . HYPOTHYROIDISM 05/27/2007  . Pneumonia    post op x 2 last yrs ago  . PONV (postoperative nausea and vomiting)    likes phenergan with anesthesia, has woken up with 2 surgeries per patient   . WEIGHT GAIN 07/15/2009   Past Surgical History:  Procedure Laterality Date  . ABDOMINAL HYSTERECTOMY    . APPENDECTOMY    . CYSTOSCOPY WITH RETROGRADE PYELOGRAM, URETEROSCOPY Right 08/19/2017   Performed by Cleon Gustin, MD at [facility]  . CYSTOSCOPY WITH RETROGRADE PYELOGRAM, URETEROSCOPY AND STENT PLACEMENT Right 04/26/2017     Performed by Cleon Gustin, MD at [facility] ORS  . DIAGNOSTIC LAPAROSCOPY    . EXPLORATORY LAPAROTOMY N/A 10/30/2011   Performed by Janie Morning, MD PhD at [facility] ORS  . FOOT SURGERY  2007   tumor removed from right foot/   . HOLMIUM LASER APPLICATION Right 7/61/6073   Performed by Cleon Gustin, MD at [facility] ORS  . KNEE ARTHROSCOPY Left    x 3  . LEFT TOTAL KNEE ARTHROPLASTY Left 09/25/2017   Performed by Latanya Maudlin, MD at [facility] ORS  . mortons neuroma removed from foot    . SALPINGO OOPHORECTOMY Right 10/30/2011   Performed by Janie Morning, MD PhD at [facility] ORS  . TONSILLECTOMY    . TUBAL LIGATION    . TYMPANOSTOMY TUBE PLACEMENT     Social History   Socioeconomic History  . Marital status: Married    Spouse name: None  . Number of children: None  . Years of education: None  . Highest education level: None  Social Needs  . Financial resource strain: None  . Food insecurity - worry: None  . Food insecurity - inability: None  . Transportation needs - medical: None  . Transportation needs - non-medical: None  Occupational History  . None  Tobacco Use  . Smoking status: Never Smoker  . Smokeless tobacco: Never Used  Substance and Sexual Activity  . Alcohol use: No  . Drug use: No  . Sexual activity: Yes  Other Topics Concern  .  None  Social History Narrative  . None   Family History  Problem Relation Age of Onset  . Alzheimer's disease Mother   . Cervical cancer Mother   . Diabetes Sister   . Heart attack Brother   . Hypertension Neg Hx        family hx  . Heart disease Neg Hx        family hx   Allergies  Allergen Reactions  . Adhesive [Tape] Swelling and Other (See Comments)    Reaction:  Eats in skin  . Ciprofloxacin Hives and Shortness Of Breath  . Dimethicone Hives and Shortness Of Breath  . Erythromycin Hives and Shortness Of Breath  . Latex Hives and Shortness Of Breath  . Other Hives, Shortness Of Breath and Other (See Comments)    Pt is  allergic to nitrile gloves.  Pt is allergic to antibacterial soaps- rash - states she only uses Dove soap  SCDs- patient states - had itching with this   . Penicillins Hives, Shortness Of Breath and Other (See Comments)    Has patient had a PCN reaction causing immediate rash, facial/tongue/throat swelling, SOB or lightheadedness with hypotension: Yes Has patient had a PCN reaction causing severe rash involving mucus membranes or skin necrosis: No Has patient had a PCN reaction that required hospitalization No Has patient had a PCN reaction occurring within the last 10 years: No If all of the above answers are "NO", then may proceed with Cephalosporin use.  . Red Dye Hives and Shortness Of Breath  . Shellfish Allergy Hives and Shortness Of Breath  . Sulfonamide Derivatives Hives and Shortness Of Breath  . Alcohol-Sulfur [Sulfur] Swelling and Other (See Comments)    Reaction:  Localized swelling  With alcohol   . Aspirin Nausea And Vomiting  . Doxycycline Nausea And Vomiting  . Hibiclens [Chlorhexidine]     Rash   . Ceftin [Cefuroxime] Rash   Prior to Admission medications   Medication Sig Start Date End Date Taking? Authorizing Provider  albuterol (PROVENTIL HFA;VENTOLIN HFA) 108 (90 Base) MCG/ACT inhaler Inhale 2 puffs into the lungs every 6 (six) hours as needed for wheezing or shortness of breath. 09/27/16  Yes Marletta Lor, MD  aspirin EC 325 MG tablet Take 1 tablet (325 mg total) 2 (two) times daily by mouth. 09/27/17  Yes Constable, Amber, PA-C  atenolol (TENORMIN) 25 MG tablet TAKE 1 TABLET (25 MG TOTAL) BY MOUTH EVERY MORNING. FOR MIGRAINES Patient taking differently: Take 25 mg by mouth daily as needed. For migraines 12/31/16  Yes Marletta Lor, MD  Biotin 1 MG CAPS Take 1 mg by mouth daily.   Yes [provider]  cholecalciferol (VITAMIN D) 1000 UNITS tablet Take 1,000 Units by mouth daily.    Yes [provider]  Cranberry 1000 MG CAPS Take  1,000 mg by mouth daily.   Yes [provider]  Cyanocobalamin (VITAMIN B-12) 2500 MCG SUBL Place 2,500 mcg under the tongue daily.    Yes [provider]  cyclobenzaprine (FLEXERIL) 10 MG tablet Take 1 tablet (10 mg total) every 8 (eight) hours as needed by mouth for muscle spasms. 09/25/17  Yes Constable, Museum/gallery conservator, PA-C  levothyroxine (SYNTHROID, LEVOTHROID) 150 MCG tablet Take 1 tablet (150 mcg total) by mouth daily. 08/29/17  Yes Marletta Lor, MD  mirabegron ER (MYRBETRIQ) 25 MG TB24 tablet Take 25 mg by mouth daily.   Yes [provider]  oxyCODONE (OXY IR/ROXICODONE) 5 MG immediate release tablet  Take 1-3 tablets (5-15 mg total) every 3 (three) hours as needed by mouth for breakthrough pain. 09/25/17  Yes Constable, Museum/gallery conservator, PA-C  phentermine 30 MG capsule TAKE ONE CAPSULE BY MOUTH DAILY 07/09/17  Yes Marletta Lor, MD  promethazine (PHENERGAN) 25 MG tablet Take 1 tablet (25 mg total) every 8 (eight) hours as needed by mouth for refractory nausea / vomiting (if response to Zofran not sufficient). 09/27/17  Yes Constable, Amber, PA-C  tamsulosin (FLOMAX) 0.4 MG CAPS capsule Take 0.4 mg by mouth daily. 06/13/17  Yes [provider]  ondansetron (ZOFRAN ODT) 4 MG disintegrating tablet Take 1 tablet (4 mg total) by mouth every 8 (eight) hours as needed for nausea or vomiting. Patient not taking: Reported on 09/28/2017 08/19/17   Cleon Gustin, MD   Dg Chest 2 View  Result Date: 09/28/2017 CLINICAL DATA:  Acute onset of fever. EXAM: CHEST  2 VIEW COMPARISON:  Chest radiograph performed 09/20/2017 FINDINGS: The lungs are well-aerated. Mild bibasilar linear atelectasis is noted. There is no evidence of pleural effusion or pneumothorax. The heart is normal in size; the mediastinal contour is within normal limits. No acute osseous abnormalities are seen. IMPRESSION: Mild bibasilar linear atelectasis noted. No definite evidence for focal airspace  consolidation. Electronically Signed   By: Garald Balding M.D.   On: 09/28/2017 02:04   Ct Head Wo Contrast  Result Date: 09/28/2017 CLINICAL DATA:  Initial evaluation for acute altered mental status. EXAM: CT HEAD WITHOUT CONTRAST TECHNIQUE: Contiguous axial images were obtained from the base of the skull through the vertex without intravenous contrast. COMPARISON:  None. FINDINGS: Brain: Cerebral volume within normal limits for patient age. No evidence for acute intracranial hemorrhage. No findings to suggest acute large vessel territory infarct. No mass lesion, midline shift, or mass effect. Ventricles are normal in size without evidence for hydrocephalus. No extra-axial fluid collection identified. Vascular: No hyperdense vessel identified. Skull: Scalp soft tissues demonstrate no acute abnormality.Calvarium intact. Sinuses/Orbits: Globes and orbital soft tissues are within normal limits. Visualized paranasal sinuses are clear. Chronic left mastoid effusion. Trace right mastoid effusion noted as well. IMPRESSION: 1. No acute intracranial process. 2. Chronic appearing left mastoid effusion. Electronically Signed   By: Jeannine Boga M.D.   On: 09/28/2017 05:22   Ct Angio Chest Pe W And/or Wo Contrast  Result Date: 09/28/2017 CLINICAL DATA:  Acute onset of fever and tachycardia. Recent left knee replacement. Confusion. EXAM: CT ANGIOGRAPHY CHEST WITH CONTRAST TECHNIQUE: Multidetector CT imaging of the chest was performed using the standard protocol during bolus administration of intravenous contrast. Multiplanar CT image reconstructions and MIPs were obtained to evaluate the vascular anatomy. CONTRAST:  100 mL ISOVUE-370 IOPAMIDOL (ISOVUE-370) INJECTION 76% COMPARISON:  Chest radiograph performed earlier today at 1:44 a.m., and CT of the chest performed 05/28/2012 FINDINGS: Cardiovascular:  There is no evidence of pulmonary embolus. The heart is normal in size. Mild mural thrombus is suggested  along the proximal great vessels, without significant luminal narrowing. Mediastinum/Nodes: The mediastinum is otherwise unremarkable. No mediastinal lymphadenopathy is seen. No pericardial effusion is identified. The thyroid gland is unremarkable. No axillary lymphadenopathy is seen. Lungs/Pleura: Mild bibasilar atelectasis or scarring is noted. No pleural effusion or pneumothorax is seen. No masses are identified. Upper Abdomen: The visualized portions of the liver and spleen are unremarkable. The gallbladder is grossly unremarkable in appearance. The visualized portions of the pancreas and right adrenal gland are within normal limits. Musculoskeletal: No acute osseous abnormalities are identified. There is  mild chronic deformity of the body of the sternum. The visualized musculature is unremarkable in appearance. Review of the MIP images confirms the above findings. IMPRESSION: 1. No evidence of pulmonary embolus. 2. Mild bibasilar atelectasis or scarring noted. Lungs otherwise clear. Electronically Signed   By: Garald Balding M.D.   On: 09/28/2017 05:40    Positive ROS: All other systems have been reviewed and were otherwise negative with the exception of those mentioned in the HPI and as above.  Physical Exam: General: Alert, no acute distress Cardiovascular: No pedal edema Respiratory: No cyanosis, no use of accessory musculature GI: No organomegaly, abdomen is soft and non-tender Skin: No lesions in the area of chief complaint Neurologic: Sensation intact distally Psychiatric: Patient is competent for consent with normal mood and affect Lymphatic: No axillary or cervical lymphadenopathy  MUSCULOSKELETAL:  Physical examination left knee:  Midline anterior incision is clean dry and intact.  There is some warmth as expected being just 3 days postop.  However no erythema, no fluctuance, and no induration.  There is no drainage.  With gentle passive range of motion at the knee joint she has no  increasing pain.  Distally her calf is soft and nontender and nonerythematous with no signs of DVT.  She is neurovascularly intact distally.  No pain with passive stretch.  No signs of compartment syndrome.  Assessment: 1.  Postoperative fever.    Plan: - At this point in time I have no suspicion for acute Postoperative surgical site infection, with no leukocytosis at this time. -Agree with hospitalist admission for electrolyte repletion and continuation of her workup.  We will follow along closely. -I would reengage physical therapy and occupational therapy while inpatient. -Okay for full weightbearing as tolerated on the left leg at this time. -Would recommend Lovenox for DVT prophylaxis while in-house.  SCDs bilaterally.    Nicholes Stairs, MD Cell [phone]    09/28/2017 8:47 AM

## 2017-09-28 NOTE — ED Triage Notes (Signed)
Pt from home via EMS with c/o of fever and tachycardia. Pt had a left knee replacement on Wed and was discharged yesterday. Pt states she was febrile and confused since discharge. Pt has many allergies including alcohol, adhesive, CHG, and latex.   Per EMS pt has an oral temperature of 103. Pt reports foul smelling urine. HR of 120, BP of 127/60, CBG 141. Pt took 25 mg of phenergan PTA. EMS administered 50 mcg of fentanyl, 200 ml NS, and 1000 mg of tylenol

## 2017-09-28 NOTE — ED Provider Notes (Signed)
[facility] DEPT Provider Note   CSN: [id number] Arrival date & time: 09/28/17  0117     History   Chief Complaint Chief Complaint  Patient presents with  . Fever  . Post-op Problem    HPI Katelyn [name] is a 61 y.o. female.  61 yo F with a chief complaint of a fever.  This been going on since yesterday.  The patient has recently been in the hospital for a left knee replacement.  She has had some pain and swelling to the left knee.  Was discharged from the hospital yesterday.  She had had some low-grade fevers in the hospital but they resolved prior to discharge.  She denies cough or congestion denies dysuria increased frequency or hesitancy.  Denies abdominal pain nausea or vomiting.  She has had a bit of a sore throat.  She thinks she had allergic reaction to pineapple while she was in the hospital and has been scraping her tongue with a tongue scraper to try and remove the allergens.  She denies any new medications.   The history is provided by the patient.  Fever   This is a new problem. The current episode started yesterday. The problem occurs constantly. The problem has been gradually worsening. The maximum temperature noted was 103 to 104 F. The temperature was taken using an oral thermometer. Associated symptoms include sore throat. Pertinent negatives include no chest pain, no vomiting, no congestion and no headaches. She has tried nothing for the symptoms. The treatment provided no relief.    Past Medical History:  Diagnosis Date  . Anemia   . Headache(784.0)    cluster headaches, migraines- states is taking Atenolol at present as directed per Dr Lacinda Axon  to prevent migraine from increased stress-  states if on medication for several weeks that heart rate will drop to 40's  . History of kidney stones   . HYPOTHYROIDISM 05/27/2007  . Pneumonia    post op x 2 last yrs ago  . PONV (postoperative nausea and vomiting)    likes phenergan  with anesthesia, has woken up with 2 surgeries per patient   . WEIGHT GAIN 07/15/2009    Patient Active Problem List   Diagnosis Date Noted  . Fever 09/28/2017  . History of total knee arthroplasty, left 09/25/2017  . Viral gastroenteritis   . Dehydration   . Intractable vomiting with nausea   . Colitis 01/08/2017  . Hypokalemia 01/08/2017  . Fatty liver 01/08/2017  . Anterior chest wall pain 05/26/2015  . Hypertension 10/18/2011  . Nephrolithiasis 10/18/2011  . Ovarian mass 10/18/2011  . Pulmonary nodule, right 10/18/2011  . WEIGHT GAIN 07/15/2009  . Hypothyroidism 05/27/2007    Past Surgical History:  Procedure Laterality Date  . ABDOMINAL HYSTERECTOMY    . APPENDECTOMY    . CYSTOSCOPY WITH RETROGRADE PYELOGRAM, URETEROSCOPY Right 08/19/2017   Performed by Cleon Gustin, MD at [facility]  . CYSTOSCOPY WITH RETROGRADE PYELOGRAM, URETEROSCOPY AND STENT PLACEMENT Right 04/26/2017   Performed by Cleon Gustin, MD at [facility]  . DIAGNOSTIC LAPAROSCOPY    . EXPLORATORY LAPAROTOMY N/A 10/30/2011   Performed by Janie Morning, MD PhD at [facility]  . FOOT SURGERY  2007   tumor removed from right foot/   . HOLMIUM LASER APPLICATION Right 7/67/2094   Performed by Cleon Gustin, MD at [facility]  . KNEE ARTHROSCOPY Left    x 3  . LEFT TOTAL KNEE ARTHROPLASTY Left  09/25/2017   Performed by Latanya Maudlin, MD at [facility]  . mortons neuroma removed from foot    . SALPINGO OOPHORECTOMY Right 10/30/2011   Performed by Janie Morning, MD PhD at [facility] ORS  . TONSILLECTOMY    . TUBAL LIGATION    . TYMPANOSTOMY TUBE PLACEMENT      OB History    No data available       Home Medications    Prior to Admission medications   Medication Sig Start Date End Date Taking? Authorizing Provider  albuterol (PROVENTIL HFA;VENTOLIN HFA) 108 (90 Base) MCG/ACT inhaler Inhale 2 puffs into the lungs every 6 (six) hours as needed for wheezing or shortness of breath. 09/27/16   Yes Marletta Lor, MD  aspirin EC 325 MG tablet Take 1 tablet (325 mg total) 2 (two) times daily by mouth. 09/27/17  Yes Constable, Amber, PA-C  atenolol (TENORMIN) 25 MG tablet TAKE 1 TABLET (25 MG TOTAL) BY MOUTH EVERY MORNING. FOR MIGRAINES Patient taking differently: Take 25 mg by mouth daily as needed. For migraines 12/31/16  Yes Marletta Lor, MD  Biotin 1 MG CAPS Take 1 mg by mouth daily.   Yes [provider]  cholecalciferol (VITAMIN D) 1000 UNITS tablet Take 1,000 Units by mouth daily.    Yes [provider]  Cranberry 1000 MG CAPS Take 1,000 mg by mouth daily.   Yes [provider]  Cyanocobalamin (VITAMIN B-12) 2500 MCG SUBL Place 2,500 mcg under the tongue daily.    Yes [provider]  cyclobenzaprine (FLEXERIL) 10 MG tablet Take 1 tablet (10 mg total) every 8 (eight) hours as needed by mouth for muscle spasms. 09/25/17  Yes Constable, Museum/gallery conservator, PA-C  levothyroxine (SYNTHROID, LEVOTHROID) 150 MCG tablet Take 1 tablet (150 mcg total) by mouth daily. 08/29/17  Yes Marletta Lor, MD  mirabegron ER (MYRBETRIQ) 25 MG TB24 tablet Take 25 mg by mouth daily.   Yes [provider]  oxyCODONE (OXY IR/ROXICODONE) 5 MG immediate release tablet Take 1-3 tablets (5-15 mg total) every 3 (three) hours as needed by mouth for breakthrough pain. 09/25/17  Yes Constable, Museum/gallery conservator, PA-C  phentermine 30 MG capsule TAKE ONE CAPSULE BY MOUTH DAILY 07/09/17  Yes Marletta Lor, MD  promethazine (PHENERGAN) 25 MG tablet Take 1 tablet (25 mg total) every 8 (eight) hours as needed by mouth for refractory nausea / vomiting (if response to Zofran not sufficient). 09/27/17  Yes Constable, Amber, PA-C  tamsulosin (FLOMAX) 0.4 MG CAPS capsule Take 0.4 mg by mouth daily. 06/13/17  Yes [provider]  ondansetron (ZOFRAN ODT) 4 MG disintegrating tablet Take 1 tablet (4 mg total) by mouth every 8 (eight) hours as needed for nausea or vomiting. Patient  not taking: Reported on 09/28/2017 08/19/17   Cleon Gustin, MD    Family History Family History  Problem Relation Age of Onset  . Alzheimer's disease Mother   . Cervical cancer Mother   . Diabetes Sister   . Heart attack Brother   . Hypertension Neg Hx        family hx  . Heart disease Neg Hx        family hx    Social History Social History   Tobacco Use  . Smoking status: Never Smoker  . Smokeless tobacco: Never Used  Substance Use Topics  . Alcohol use: No  . Drug use: No     Allergies   Adhesive [tape]; Ciprofloxacin; Dimethicone; Erythromycin; Latex; Other; Penicillins;  Red dye; Shellfish allergy; Sulfonamide derivatives; Alcohol-sulfur [sulfur]; Aspirin; Doxycycline; Hibiclens [chlorhexidine]; and Ceftin [cefuroxime]   Review of Systems Review of Systems  Constitutional: Positive for fatigue and fever. Negative for chills.  HENT: Positive for sore throat. Negative for congestion and rhinorrhea.   Eyes: Negative for redness and visual disturbance.  Respiratory: Negative for shortness of breath and wheezing.   Cardiovascular: Negative for chest pain and palpitations.  Gastrointestinal: Negative for nausea and vomiting.  Genitourinary: Negative for dysuria and urgency.  Musculoskeletal: Negative for arthralgias and myalgias.  Skin: Negative for pallor and wound.  Neurological: Negative for dizziness and headaches.     Physical Exam Updated Vital Signs BP 110/73 (BP Location: Right Arm)   Pulse (!) 106   Temp 98.7 F (37.1 C) (Oral)   Resp 16   Ht 5\' 7"  (1.702 m)   Wt 78.5 kg (173 lb)   SpO2 95%   BMI 27.10 kg/m   Physical Exam  Constitutional: She appears well-developed and well-nourished. No distress.  HENT:  Head: Normocephalic and atraumatic.  Swollen turbinates, posterior nasal drip, no noted sinus ttp, tm normal bilaterally.   The patient has what appears to be thrush of the tongue and has some erythema and erosions to the soft palate  bilaterally.   Eyes: EOM are normal. Pupils are equal, round, and reactive to light.  Neck: Normal range of motion. Neck supple.  Cardiovascular: Normal rate and regular rhythm. Exam reveals no gallop and no friction rub.  No murmur heard. Pulmonary/Chest: Effort normal. She has no wheezes. She has no rales.  Abdominal: Soft. She exhibits no distension. There is no tenderness.  Musculoskeletal: She exhibits no edema or tenderness.  Left knee linear laceration is clean dry and intact.  It is mildly swollen.  It is not hot.  There is no erythema.  No drainage.  Neurological: She is alert.  The patient is able to answer all my questions appropriately but she quickly falls asleep during the exam.  Skin: Skin is warm and dry. She is not diaphoretic.  Psychiatric: She has a normal mood and affect. Her behavior is normal.  Nursing note and vitals reviewed.    ED Treatments / Results  Labs (all labs ordered are listed, but only abnormal results are displayed) Labs Reviewed  COMPREHENSIVE METABOLIC PANEL - Abnormal; Notable for the following components:      Result Value   Sodium 132 (*)    Potassium 2.9 (*)    Chloride 96 (*)    Glucose, Bld 124 (*)    Calcium 8.1 (*)    Total Protein 6.2 (*)    Albumin 3.1 (*)    All other components within normal limits  CBC WITH DIFFERENTIAL/PLATELET - Abnormal; Notable for the following components:   Hemoglobin 11.3 (*)    HCT 33.6 (*)    All other components within normal limits  URINALYSIS, ROUTINE W REFLEX MICROSCOPIC - Abnormal; Notable for the following components:   Hgb urine dipstick MODERATE (*)    Ketones, ur 20 (*)    Squamous Epithelial / LPF 0-5 (*)    All other components within normal limits  BLOOD GAS, VENOUS - Abnormal; Notable for the following components:   pCO2, Ven 29.3 (*)    pO2, Ven 47.9 (*)    Bicarbonate 17.5 (*)    Acid-base deficit 6.1 (*)    All other components within normal limits  I-STAT CG4 LACTIC ACID, ED -  Abnormal; Notable for the following components:  Lactic Acid, Venous 0.36 (*)    All other components within normal limits  CULTURE, BLOOD (ROUTINE X 2)  CULTURE, BLOOD (ROUTINE X 2)  URINE CULTURE  I-STAT CG4 LACTIC ACID, ED    EKG  EKG Interpretation None       Radiology Dg Chest 2 View  Result Date: 09/28/2017 CLINICAL DATA:  Acute onset of fever. EXAM: CHEST  2 VIEW COMPARISON:  Chest radiograph performed 09/20/2017 FINDINGS: The lungs are well-aerated. Mild bibasilar linear atelectasis is noted. There is no evidence of pleural effusion or pneumothorax. The heart is normal in size; the mediastinal contour is within normal limits. No acute osseous abnormalities are seen. IMPRESSION: Mild bibasilar linear atelectasis noted. No definite evidence for focal airspace consolidation. Electronically Signed   By: Garald Balding M.D.   On: 09/28/2017 02:04   Ct Head Wo Contrast  Result Date: 09/28/2017 CLINICAL DATA:  Initial evaluation for acute altered mental status. EXAM: CT HEAD WITHOUT CONTRAST TECHNIQUE: Contiguous axial images were obtained from the base of the skull through the vertex without intravenous contrast. COMPARISON:  None. FINDINGS: Brain: Cerebral volume within normal limits for patient age. No evidence for acute intracranial hemorrhage. No findings to suggest acute large vessel territory infarct. No mass lesion, midline shift, or mass effect. Ventricles are normal in size without evidence for hydrocephalus. No extra-axial fluid collection identified. Vascular: No hyperdense vessel identified. Skull: Scalp soft tissues demonstrate no acute abnormality.Calvarium intact. Sinuses/Orbits: Globes and orbital soft tissues are within normal limits. Visualized paranasal sinuses are clear. Chronic left mastoid effusion. Trace right mastoid effusion noted as well. IMPRESSION: 1. No acute intracranial process. 2. Chronic appearing left mastoid effusion. Electronically Signed   By: Jeannine Boga M.D.   On: 09/28/2017 05:22   Ct Angio Chest Pe W And/or Wo Contrast  Result Date: 09/28/2017 CLINICAL DATA:  Acute onset of fever and tachycardia. Recent left knee replacement. Confusion. EXAM: CT ANGIOGRAPHY CHEST WITH CONTRAST TECHNIQUE: Multidetector CT imaging of the chest was performed using the standard protocol during bolus administration of intravenous contrast. Multiplanar CT image reconstructions and MIPs were obtained to evaluate the vascular anatomy. CONTRAST:  100 mL ISOVUE-370 IOPAMIDOL (ISOVUE-370) INJECTION 76% COMPARISON:  Chest radiograph performed earlier today at 1:44 a.m., and CT of the chest performed 05/28/2012 FINDINGS: Cardiovascular:  There is no evidence of pulmonary embolus. The heart is normal in size. Mild mural thrombus is suggested along the proximal great vessels, without significant luminal narrowing. Mediastinum/Nodes: The mediastinum is otherwise unremarkable. No mediastinal lymphadenopathy is seen. No pericardial effusion is identified. The thyroid gland is unremarkable. No axillary lymphadenopathy is seen. Lungs/Pleura: Mild bibasilar atelectasis or scarring is noted. No pleural effusion or pneumothorax is seen. No masses are identified. Upper Abdomen: The visualized portions of the liver and spleen are unremarkable. The gallbladder is grossly unremarkable in appearance. The visualized portions of the pancreas and right adrenal gland are within normal limits. Musculoskeletal: No acute osseous abnormalities are identified. There is mild chronic deformity of the body of the sternum. The visualized musculature is unremarkable in appearance. Review of the MIP images confirms the above findings. IMPRESSION: 1. No evidence of pulmonary embolus. 2. Mild bibasilar atelectasis or scarring noted. Lungs otherwise clear. Electronically Signed   By: Garald Balding M.D.   On: 09/28/2017 05:40    Procedures Procedures (including critical care time)  Medications Ordered  in ED Medications  potassium chloride SA (K-DUR,KLOR-CON) CR tablet 40 mEq (40 mEq Oral Refused 09/28/17 0425)  magnesium  oxide (MAG-OX) tablet 800 mg (800 mg Oral Refused 09/28/17 0425)  potassium chloride 10 mEq in 100 mL IVPB (not administered)  sodium chloride 0.9 % bolus 1,000 mL (0 mLs Intravenous Stopped 09/28/17 0341)  iopamidol (ISOVUE-370) 76 % injection 100 mL (100 mLs Intravenous Contrast Given 09/28/17 0434)     Initial Impression / Assessment and Plan / ED Course  I have reviewed the triage vital signs and the nursing notes.  Pertinent labs & imaging results that were available during my care of the patient were reviewed by me and considered in my medical decision making (see chart for details).     61 yo F with a chief complaint of fever.  Patient was recently in the hospital.  Her left knee does not appear grossly infected.  She has a normal white blood cell count.  Her lactate is normal.  She however is very sleepy on exam and drops her O2 sat into the low 80s.  Chest x-ray is negative for pneumonia.  She has some erythema and erosions inside of her oropharynx.  She is not currently on antibiotics.  With the patient's hypoxia will obtain a CT of the chest that she recently had surgery.  CT of the head for altered mental status.  VBG.  Imaging studies are unremarkable.  The patient continues to become transiently hypoxic and has persistent tachycardia.  I discussed the case with the hospitalist who will place the patient in observation.  The patients results and plan were reviewed and discussed.   Any x-rays performed were independently reviewed by myself.   Differential diagnosis were considered with the presenting HPI.  Medications  potassium chloride SA (K-DUR,KLOR-CON) CR tablet 40 mEq (40 mEq Oral Refused 09/28/17 0425)  magnesium oxide (MAG-OX) tablet 800 mg (800 mg Oral Refused 09/28/17 0425)  potassium chloride 10 mEq in 100 mL IVPB (not administered)  sodium  chloride 0.9 % bolus 1,000 mL (0 mLs Intravenous Stopped 09/28/17 0341)  iopamidol (ISOVUE-370) 76 % injection 100 mL (100 mLs Intravenous Contrast Given 09/28/17 0434)    Vitals:   09/28/17 0300 09/28/17 0341 09/28/17 0530 09/28/17 0555  BP: 125/68   110/73  Pulse: (!) 103  (!) 107 (!) 106  Resp: 16   16  Temp: 98.7 F (37.1 C)   98.7 F (37.1 C)  TempSrc: Oral   Oral  SpO2: 91%  (!) 86% 95%  Weight:  78.5 kg (173 lb)    Height:  5\' 7"  (1.702 m)      Final diagnoses:  Fever of unknown origin  Transient alteration of awareness  Hypoxia  Hypokalemia    Admission/ observation were discussed with the admitting physician, patient and/or family and they are comfortable with the plan.    Final Clinical Impressions(s) / ED Diagnoses   Final diagnoses:  Fever of unknown origin  Transient alteration of awareness  Hypoxia  Hypokalemia    ED Discharge Orders    None       Deno Etienne, DO 09/28/17 6712

## 2017-09-28 NOTE — H&P (Signed)
[facility] History and Physical  Katelyn [name] YWV:[id number] DOB: December 06, 1955 DOA: 09/28/2017   PCP: Marletta Lor, MD  Specialists: [facility] orthopedic  Chief Complaint: Fever  HPI: Katelyn [name] is a 61 y.o. female with a past medical history of hypothyroidism, multiple allergies, osteoarthritis who recently underwent left total knee replacement on 11/14.  Patient was discharged home on 11/16.  According to the patient and her husband patient was a bit lethargic even at discharge.  She was having low-grade fevers prior to discharge.  She did not do a whole lot after she got home yesterday and slept for most of the day.  She had some nausea.  And then she developed a fever yesterday evening of 103 F.  Patient was brought back to the hospital.  She denies any headache.  States that her knee pain is getting better.  Denies any cough.  Does mention a sore throat.  No sick contacts.  No rashes.  Had some nausea but no vomiting.  No diarrhea.  No urinary complaints.  In the emergency department evaluation has been pretty much unremarkable.  However she was noted to be hypoxic when she would be sleeping.  CT scan of the chest did not show any PE.  Patient denies any history of sleep apnea.  Patient will need observation in the hospital.  Home Medications: Prior to Admission medications   Medication Sig Start Date End Date Taking? Authorizing Provider  albuterol (PROVENTIL HFA;VENTOLIN HFA) 108 (90 Base) MCG/ACT inhaler Inhale 2 puffs into the lungs every 6 (six) hours as needed for wheezing or shortness of breath. 09/27/16  Yes Marletta Lor, MD  aspirin EC 325 MG tablet Take 1 tablet (325 mg total) 2 (two) times daily by mouth. 09/27/17  Yes Constable, Amber, PA-C  atenolol (TENORMIN) 25 MG tablet TAKE 1 TABLET (25 MG TOTAL) BY MOUTH EVERY MORNING. FOR MIGRAINES Patient taking differently: Take 25 mg by mouth daily as needed. For migraines 12/31/16  Yes Marletta Lor, MD  Biotin 1 MG CAPS Take 1 mg by mouth daily.   Yes [provider]  cholecalciferol (VITAMIN D) 1000 UNITS tablet Take 1,000 Units by mouth daily.    Yes [provider]  Cranberry 1000 MG CAPS Take 1,000 mg by mouth daily.   Yes [provider]  Cyanocobalamin (VITAMIN B-12) 2500 MCG SUBL Place 2,500 mcg under the tongue daily.    Yes [provider]  cyclobenzaprine (FLEXERIL) 10 MG tablet Take 1 tablet (10 mg total) every 8 (eight) hours as needed by mouth for muscle spasms. 09/25/17  Yes Constable, Museum/gallery conservator, PA-C  levothyroxine (SYNTHROID, LEVOTHROID) 150 MCG tablet Take 1 tablet (150 mcg total) by mouth daily. 08/29/17  Yes Marletta Lor, MD  mirabegron ER (MYRBETRIQ) 25 MG TB24 tablet Take 25 mg by mouth daily.   Yes [provider]  oxyCODONE (OXY IR/ROXICODONE) 5 MG immediate release tablet Take 1-3 tablets (5-15 mg total) every 3 (three) hours as needed by mouth for breakthrough pain. 09/25/17  Yes Constable, Museum/gallery conservator, PA-C  phentermine 30 MG capsule TAKE ONE CAPSULE BY MOUTH DAILY 07/09/17  Yes Marletta Lor, MD  promethazine (PHENERGAN) 25 MG tablet Take 1 tablet (25 mg total) every 8 (eight) hours as needed by mouth for refractory nausea / vomiting (if response to Zofran not sufficient). 09/27/17  Yes Constable, Amber, PA-C  tamsulosin (FLOMAX) 0.4 MG CAPS capsule Take 0.4 mg by mouth daily. 06/13/17  Yes [provider]  ondansetron (ZOFRAN ODT) 4 MG disintegrating tablet Take 1 tablet (4 mg total) by mouth every 8 (eight) hours as needed for nausea or vomiting. Patient not taking: Reported on 09/28/2017 08/19/17   Cleon Gustin, MD    Allergies:  Allergies  Allergen Reactions  . Adhesive [Tape] Swelling and Other (See Comments)    Reaction:  Eats in skin  . Ciprofloxacin Hives and Shortness Of Breath  . Dimethicone Hives and Shortness Of Breath  . Erythromycin Hives and Shortness Of Breath  . Latex Hives and  Shortness Of Breath  . Other Hives, Shortness Of Breath and Other (See Comments)    Pt is allergic to nitrile gloves.  Pt is allergic to antibacterial soaps- rash - states she only uses Dove soap  SCDs- patient states - had itching with this   . Penicillins Hives, Shortness Of Breath and Other (See Comments)    Has patient had a PCN reaction causing immediate rash, facial/tongue/throat swelling, SOB or lightheadedness with hypotension: Yes Has patient had a PCN reaction causing severe rash involving mucus membranes or skin necrosis: No Has patient had a PCN reaction that required hospitalization No Has patient had a PCN reaction occurring within the last 10 years: No If all of the above answers are "NO", then may proceed with Cephalosporin use.  . Red Dye Hives and Shortness Of Breath  . Shellfish Allergy Hives and Shortness Of Breath  . Sulfonamide Derivatives Hives and Shortness Of Breath  . Alcohol-Sulfur [Sulfur] Swelling and Other (See Comments)    Reaction:  Localized swelling  With alcohol   . Aspirin Nausea And Vomiting  . Doxycycline Nausea And Vomiting  . Hibiclens [Chlorhexidine]     Rash   . Ceftin [Cefuroxime] Rash    Past Medical History: Past Medical History:  Diagnosis Date  . Anemia   . Headache(784.0)    cluster headaches, migraines- states is taking Atenolol at present as directed per Dr Lacinda Axon  to prevent migraine from increased stress-  states if on medication for several weeks that heart rate will drop to 40's  . History of kidney stones   . HYPOTHYROIDISM 05/27/2007  . Pneumonia    post op x 2 last yrs ago  . PONV (postoperative nausea and vomiting)    likes phenergan with anesthesia, has woken up with 2 surgeries per patient   . WEIGHT GAIN 07/15/2009    Past Surgical History:  Procedure Laterality Date  . ABDOMINAL HYSTERECTOMY    . APPENDECTOMY    . CYSTOSCOPY WITH RETROGRADE PYELOGRAM, URETEROSCOPY Right 08/19/2017   Performed by Cleon Gustin, MD at [facility]  . CYSTOSCOPY WITH RETROGRADE PYELOGRAM, URETEROSCOPY AND STENT PLACEMENT Right 04/26/2017   Performed by Cleon Gustin, MD at [facility] ORS  . DIAGNOSTIC LAPAROSCOPY    . EXPLORATORY LAPAROTOMY N/A 10/30/2011   Performed by Janie Morning, MD PhD at [facility] ORS  . FOOT SURGERY  2007   tumor removed from right foot/   . HOLMIUM LASER APPLICATION Right 8/92/1194   Performed by Cleon Gustin, MD at [facility] ORS  . KNEE ARTHROSCOPY Left    x 3  . LEFT TOTAL KNEE ARTHROPLASTY Left 09/25/2017   Performed by Latanya Maudlin, MD at [facility] ORS  . mortons neuroma removed from foot    . SALPINGO OOPHORECTOMY Right 10/30/2011   Performed by Janie Morning, MD PhD at [facility] ORS  . TONSILLECTOMY    . TUBAL LIGATION    . TYMPANOSTOMY  TUBE PLACEMENT      Social History: Patient denies any history of smoking alcohol use.  No recreational drug use.  Since her surgery she has been using a walker to ambulate.  Lives with family.   Family History:  Family History  Problem Relation Age of Onset  . Alzheimer's disease Mother   . Cervical cancer Mother   . Diabetes Sister   . Heart attack Brother   . Hypertension Neg Hx        family hx  . Heart disease Neg Hx        family hx     Review of Systems - History obtained from the patient General ROS: positive for  - fatigue Psychological ROS: negative Ophthalmic ROS: negative ENT ROS: positive for - sore throat Allergy and Immunology ROS: Positive for multiple drug allergies. Hematological and Lymphatic ROS: negative Endocrine ROS: negative Respiratory ROS: no cough, shortness of breath, or wheezing Cardiovascular ROS: no chest pain or dyspnea on exertion Gastrointestinal ROS: no abdominal pain, change in bowel habits, or black or bloody stools Genito-Urinary ROS: no dysuria, trouble voiding, or hematuria Musculoskeletal ROS: negative Neurological ROS: no TIA or stroke symptoms Dermatological ROS:  negative  Physical Examination  Vitals:   09/28/17 0341 09/28/17 0530 09/28/17 0555 09/28/17 0805  BP:   110/73 128/70  Pulse:  (!) 107 (!) 106 (!) 106  Resp:   16 16  Temp:   98.7 F (37.1 C) 98.3 F (36.8 C)  TempSrc:   Oral Oral  SpO2:  (!) 86% 95% 99%  Weight: 78.5 kg (173 lb)     Height: 5\' 7"  (1.702 m)       BP 128/70 (BP Location: Right Arm)   Pulse (!) 106   Temp 98.3 F (36.8 C) (Oral)   Resp 16   Ht 5\' 7"  (1.702 m)   Wt 78.5 kg (173 lb)   SpO2 99%   BMI 27.10 kg/m   General appearance: alert, cooperative, fatigued and no distress Head: Normocephalic, without obvious abnormality, atraumatic Eyes: conjunctivae/corneas clear. PERRL, EOM's intact.  Throat: lips, mucosa, and tongue normal; teeth and gums normal Neck: no adenopathy, no carotid bruit, no JVD, supple, symmetrical, trachea midline and thyroid not enlarged, symmetric, no tenderness/mass/nodules Resp: clear to auscultation bilaterally Cardio: regular rate and rhythm, S1, S2 normal, no murmur, click, rub or gallop GI: soft, non-tender; bowel sounds normal; no masses,  no organomegaly Extremities: Some swelling of the left lower extremity is noted due to recent surgery.  Able to move her legs. Pulses: 2+ and symmetric Skin: Skin color, texture, turgor normal. No rashes or lesions Lymph nodes: Cervical, supraclavicular, and axillary nodes normal. Neurologic: Slightly lethargic but easily arousable.  Cranial nerves II to XII intact.  Motor strength equal bilateral upper and lower extremities.   Labs on Admission: I have personally reviewed following labs and imaging studies  CBC: Recent Labs  Lab 09/26/17 0519 09/27/17 0524 09/28/17 0126  WBC 8.8 7.3 9.2  NEUTROABS  --   --  7.3  HGB 12.1 11.9* 11.3*  HCT 35.3* 36.1 33.6*  MCV 85.7 87.6 86.6  PLT 235 194 272   Basic Metabolic Panel: Recent Labs  Lab 09/26/17 0519 09/27/17 0524 09/28/17 0126  NA 138 134* 132*  K 3.6 3.1* 2.9*  CL 101 97*  96*  CO2 30 31 27   GLUCOSE 118* 106* 124*  BUN 8 10 9   CREATININE 0.76 0.85 0.81  CALCIUM 8.6* 8.3* 8.1*   GFR:  Estimated Creatinine Clearance: 78.8 mL/min (by C-G formula based on SCr of 0.81 mg/dL). Liver Function Tests: Recent Labs  Lab 09/28/17 0126  AST 19  ALT 22  ALKPHOS 64  BILITOT 1.0  PROT 6.2*  ALBUMIN 3.1*     Radiological Exams on Admission: Dg Chest 2 View  Result Date: 09/28/2017 CLINICAL DATA:  Acute onset of fever. EXAM: CHEST  2 VIEW COMPARISON:  Chest radiograph performed 09/20/2017 FINDINGS: The lungs are well-aerated. Mild bibasilar linear atelectasis is noted. There is no evidence of pleural effusion or pneumothorax. The heart is normal in size; the mediastinal contour is within normal limits. No acute osseous abnormalities are seen. IMPRESSION: Mild bibasilar linear atelectasis noted. No definite evidence for focal airspace consolidation. Electronically Signed   By: Garald Balding M.D.   On: 09/28/2017 02:04   Ct Head Wo Contrast  Result Date: 09/28/2017 CLINICAL DATA:  Initial evaluation for acute altered mental status. EXAM: CT HEAD WITHOUT CONTRAST TECHNIQUE: Contiguous axial images were obtained from the base of the skull through the vertex without intravenous contrast. COMPARISON:  None. FINDINGS: Brain: Cerebral volume within normal limits for patient age. No evidence for acute intracranial hemorrhage. No findings to suggest acute large vessel territory infarct. No mass lesion, midline shift, or mass effect. Ventricles are normal in size without evidence for hydrocephalus. No extra-axial fluid collection identified. Vascular: No hyperdense vessel identified. Skull: Scalp soft tissues demonstrate no acute abnormality.Calvarium intact. Sinuses/Orbits: Globes and orbital soft tissues are within normal limits. Visualized paranasal sinuses are clear. Chronic left mastoid effusion. Trace right mastoid effusion noted as well. IMPRESSION: 1. No acute intracranial  process. 2. Chronic appearing left mastoid effusion. Electronically Signed   By: Jeannine Boga M.D.   On: 09/28/2017 05:22   Ct Angio Chest Pe W And/or Wo Contrast  Result Date: 09/28/2017 CLINICAL DATA:  Acute onset of fever and tachycardia. Recent left knee replacement. Confusion. EXAM: CT ANGIOGRAPHY CHEST WITH CONTRAST TECHNIQUE: Multidetector CT imaging of the chest was performed using the standard protocol during bolus administration of intravenous contrast. Multiplanar CT image reconstructions and MIPs were obtained to evaluate the vascular anatomy. CONTRAST:  100 mL ISOVUE-370 IOPAMIDOL (ISOVUE-370) INJECTION 76% COMPARISON:  Chest radiograph performed earlier today at 1:44 a.m., and CT of the chest performed 05/28/2012 FINDINGS: Cardiovascular:  There is no evidence of pulmonary embolus. The heart is normal in size. Mild mural thrombus is suggested along the proximal great vessels, without significant luminal narrowing. Mediastinum/Nodes: The mediastinum is otherwise unremarkable. No mediastinal lymphadenopathy is seen. No pericardial effusion is identified. The thyroid gland is unremarkable. No axillary lymphadenopathy is seen. Lungs/Pleura: Mild bibasilar atelectasis or scarring is noted. No pleural effusion or pneumothorax is seen. No masses are identified. Upper Abdomen: The visualized portions of the liver and spleen are unremarkable. The gallbladder is grossly unremarkable in appearance. The visualized portions of the pancreas and right adrenal gland are within normal limits. Musculoskeletal: No acute osseous abnormalities are identified. There is mild chronic deformity of the body of the sternum. The visualized musculature is unremarkable in appearance. Review of the MIP images confirms the above findings. IMPRESSION: 1. No evidence of pulmonary embolus. 2. Mild bibasilar atelectasis or scarring noted. Lungs otherwise clear. Electronically Signed   By: Garald Balding M.D.   On: 09/28/2017  05:40    My interpretation of Electrocardiogram: Sinus bradycardia at 58 bpm.  Left axis deviation.  Intervals normal.  No concerning ST segment or T wave changes.   Problem List  Principal Problem:   Fever Active Problems:   Hypothyroidism   Hypokalemia   History of total knee arthroplasty, left   Assessment: This is a 61 year old Caucasian female with past medical history as stated earlier who underwent left total knee replacement on 11/14.  She presents with fever.  No clear source is identified on workup so far.  She does mention sore throat.  Might benefit from getting a strep screen although likelihood of the same is low.  Fever could still be related to recent surgery.  She is noted to be somewhat lethargic though easily arousable.  This is also likely due to fever and medications.  Her neck is soft and supple.  She denies any headaches.  Plan: #1 fever of unknown origin/lethargy: Proceed with strep screen and influenza PCR.  Follow-up on blood cultures.  Lactic acid level is normal.  UA was unremarkable.  CT scan of the chest does not show any pneumonia.  WBC is normal.  We will hold off on antibiotics at this time.  CT scan of the head does not show any acute findings.  Patient does not have any focal neurological deficits.  #2  Hypoxia: Mainly when she is sleeping.  With oxygen sats have come up to the mid 90s.  Continue to monitor for now.  CT scan does not show any abnormalities.  Avoid sedative medications as well as narcotics.  #3  Hyponatremia and hypokalemia: Potassium will be repleted intravenously.  She did not tolerate oral supplementation.  These labs will be repeated tomorrow morning.  Check magnesium level.  #4  Normocytic anemia: Continue to monitor hemoglobin.  #5  Recent left knee arthroplasty: Knee was examined by the ED provider and the dressing was recently changed.  Apparently no evidence for infection in that knee.  Orthopedics has been notified.  Pain  control.  PT and OT evaluation.  Continue knee immobilizer.  #6  History of hypothyroidism: Continue home medications.   DVT Prophylaxis: Lovenox for now.  She was discharged on twice a day aspirin Code Status: Full code Family Communication: Discussed with the patient and her husband Consults called: Orthopedics called by EDP  Severity of Illness: The appropriate patient status for this patient is OBSERVATION. Observation status is judged to be reasonable and necessary in order to provide the required intensity of service to ensure the patient's safety. The patient's presenting symptoms, physical exam findings, and initial radiographic and laboratory data in the context of their medical condition is felt to place them at decreased risk for further clinical deterioration. Furthermore, it is anticipated that the patient will be medically stable for discharge from the hospital within 2 midnights of admission. The following factors support the patient status of observation.   " The patient's presenting symptoms include fever. " The physical exam findings include hypoxia. " The initial radiographic and laboratory data revealed hypokalemia.   Further management decisions will depend on results of further testing and patient's response to treatment.   Bonnielee Haff  [facility] Pager [phone]  If 7PM-7AM, please contact night-coverage [email or web address] Password [email or web address]  09/28/2017, 8:35 AM

## 2017-09-29 DIAGNOSIS — J9811 Atelectasis: Secondary | ICD-10-CM

## 2017-09-29 DIAGNOSIS — R509 Fever, unspecified: Secondary | ICD-10-CM

## 2017-09-29 DIAGNOSIS — E876 Hypokalemia: Secondary | ICD-10-CM | POA: Diagnosis not present

## 2017-09-29 LAB — COMPREHENSIVE METABOLIC PANEL
ALK PHOS: 59 U/L (ref 38–126)
ALT: 18 U/L (ref 14–54)
AST: 17 U/L (ref 15–41)
Albumin: 2.7 g/dL — ABNORMAL LOW (ref 3.5–5.0)
Anion gap: 7 (ref 5–15)
BUN: 5 mg/dL — AB (ref 6–20)
CALCIUM: 8 mg/dL — AB (ref 8.9–10.3)
CO2: 26 mmol/L (ref 22–32)
CREATININE: 0.6 mg/dL (ref 0.44–1.00)
Chloride: 104 mmol/L (ref 101–111)
Glucose, Bld: 107 mg/dL — ABNORMAL HIGH (ref 65–99)
Potassium: 3.2 mmol/L — ABNORMAL LOW (ref 3.5–5.1)
Sodium: 137 mmol/L (ref 135–145)
Total Bilirubin: 0.8 mg/dL (ref 0.3–1.2)
Total Protein: 5.8 g/dL — ABNORMAL LOW (ref 6.5–8.1)

## 2017-09-29 LAB — CBC
HEMATOCRIT: 31.1 % — AB (ref 36.0–46.0)
HEMOGLOBIN: 10.2 g/dL — AB (ref 12.0–15.0)
MCH: 28.8 pg (ref 26.0–34.0)
MCHC: 32.8 g/dL (ref 30.0–36.0)
MCV: 87.9 fL (ref 78.0–100.0)
Platelets: 223 10*3/uL (ref 150–400)
RBC: 3.54 MIL/uL — AB (ref 3.87–5.11)
RDW: 13.6 % (ref 11.5–15.5)
WBC: 6.4 10*3/uL (ref 4.0–10.5)

## 2017-09-29 LAB — URINE CULTURE: Culture: NO GROWTH

## 2017-09-29 LAB — MAGNESIUM: MAGNESIUM: 1.7 mg/dL (ref 1.7–2.4)

## 2017-09-29 MED ORDER — MAGNESIUM SULFATE 2 GM/50ML IV SOLN
2.0000 g | Freq: Once | INTRAVENOUS | Status: AC
  Administered 2017-09-29: 2 g via INTRAVENOUS
  Filled 2017-09-29: qty 50

## 2017-09-29 MED ORDER — ATENOLOL 25 MG PO TABS
25.0000 mg | ORAL_TABLET | Freq: Every day | ORAL | Status: DC
  Administered 2017-09-29 – 2017-09-30 (×2): 25 mg via ORAL
  Filled 2017-09-29 (×2): qty 1

## 2017-09-29 MED ORDER — POTASSIUM CHLORIDE CRYS ER 20 MEQ PO TBCR
40.0000 meq | EXTENDED_RELEASE_TABLET | Freq: Two times a day (BID) | ORAL | Status: DC
  Administered 2017-09-29 (×2): 40 meq via ORAL
  Filled 2017-09-29 (×3): qty 2

## 2017-09-29 MED ORDER — TAMSULOSIN HCL 0.4 MG PO CAPS
0.4000 mg | ORAL_CAPSULE | Freq: Every day | ORAL | Status: DC
  Administered 2017-09-29 – 2017-09-30 (×2): 0.4 mg via ORAL
  Filled 2017-09-29 (×2): qty 1

## 2017-09-29 NOTE — Progress Notes (Signed)
     Subjective: Postoperative fever and mental status changes after a left TKA    Patient reports pain as mild, pain controlled after surgery.  Feeling much better since being back in the hospital.  Objective:   VITALS:   Vitals:   09/28/17 2132 09/29/17 0506  BP: (!) 149/72 (!) 152/80  Pulse: (!) 111 (!) 108  Resp: 16 16  Temp: 99.9 F (37.7 C) 99.8 F (37.7 C)  SpO2: 96% 100%    Dorsiflexion/Plantar flexion intact Incision: dressing C/D/I No cellulitis present Compartment soft  LABS Recent Labs    09/27/17 0524 09/28/17 0126 09/29/17 0446  HGB 11.9* 11.3* 10.2*  HCT 36.1 33.6* 31.1*  WBC 7.3 9.2 6.4  PLT 194 207 223    Recent Labs    09/27/17 0524 09/28/17 0126 09/29/17 0446  NA 134* 132* 137  K 3.1* 2.9* 3.2*  BUN 10 9 5*  CREATININE 0.85 0.81 0.60  GLUCOSE 106* 124* 107*     Assessment/Plan: Postoperative fever and mental status changes after a left TKA    Up with therapy if able Discharge home when medically ready     [place] Pugh. Zerrick Hanssen   PAC  09/29/2017, 8:23 AM

## 2017-09-29 NOTE — Evaluation (Signed)
Physical Therapy Evaluation Patient Details Name: Katelyn [name] MRN: [id number] DOB: 1956/03/17 Today's Date: 09/29/2017   History of Present Illness  Katelyn [name] is a 61 y.o. female with a past medical history of hypothyroidism, multiple allergies, osteoarthritis who recently underwent left total knee replacement on 11/14.  Patient was discharged home on 11/16.  According to the patient and her husband patient was a bit lethargic even at discharge.  She was having low-grade fevers prior to discharge.  She did not do a whole lot after she got home yesterday and slept for most of the day.  She had some nausea.  And then she developed a fever yesterday evening of 103 F.  Patient was brought back to the hospital.  She denies any headache  Clinical Impression  Pt admitted as above and presenting with decreased L LE strength/ROM and post op pain limiting functional mobility.  Pt should progress to dc home with family assist.    Follow Up Recommendations Outpatient PT;DC plan and follow up therapy as arranged by surgeon(Pt states she prefers OP PT)    Equipment Recommendations  None recommended by PT    Recommendations for Other Services       Precautions / Restrictions Precautions Precautions: Knee;Fall Precaution Comments: reviewed importance of no pillow under knee Knee Immobilizer - Left: Discontinue once straight leg raise with < 10 degree lag(Pt states dc by Dr Katelyn [name] yesterday) Restrictions Weight Bearing Restrictions: No Other Position/Activity Restrictions: WBAT      Mobility  Bed Mobility               General bed mobility comments: Pt OOB and requests back to chair  Transfers Overall transfer level: Needs assistance Equipment used: Rolling walker (2 wheeled) Transfers: Sit to/from Stand Sit to Stand: Min guard;Supervision Stand pivot transfers: Supervision       General transfer comment: cues for LE management and use of UEs to self  assist  Ambulation/Gait Ambulation/Gait assistance: Min guard;Supervision Ambulation Distance (Feet): 200 Feet Assistive device: Rolling walker (2 wheeled) Gait Pattern/deviations: Step-to pattern;Decreased step length - right;Decreased step length - left;Decreased weight shift to left;Trunk flexed Gait velocity: decr Gait velocity interpretation: Below normal speed for age/gender General Gait Details: min cues for sequence, posture and position from RW.  INcreased time with multiple standing rest breaks required to complete task  Stairs            Wheelchair Mobility    Modified Rankin (Stroke Patients Only)       Balance Overall balance assessment: Needs assistance Sitting-balance support: Feet supported;No upper extremity supported Sitting balance-Leahy Scale: Good     Standing balance support: No upper extremity supported Standing balance-Leahy Scale: Fair                               Pertinent Vitals/Pain Pain Assessment: 0-10 Pain Score: 5  Pain Location: L knee Pain Descriptors / Indicators: Aching;Sore Pain Intervention(s): Limited activity within patient's tolerance;Monitored during session;Premedicated before session;Ice applied    Home Living Family/patient expects to be discharged to:: Private residence Living Arrangements: Spouse/significant other Available Help at Discharge: Family Type of Home: House Home Access: Stairs to enter Entrance Stairs-Rails: Psychiatric nurse of Steps: 4 Home Layout: One level Home Equipment: Environmental consultant - 2 wheels;Walker - 4 wheels;Cane - single point;Crutches Additional Comments: 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    Prior Function Level of Independence: Independent  with assistive device(s);Independent         Comments: using crutches since scope earlier this year     Hand Dominance        Extremity/Trunk Assessment   Upper Extremity Assessment Upper Extremity  Assessment: Overall WFL for tasks assessed    Lower Extremity Assessment Lower Extremity Assessment: LLE deficits/detail LLE Deficits / Details: 2/5 quads with AAROM at knee -10 - 40    Cervical / Trunk Assessment Cervical / Trunk Assessment: Normal  Communication   Communication: No difficulties  Cognition Arousal/Alertness: Awake/alert Behavior During Therapy: WFL for tasks assessed/performed Overall Cognitive Status: Within Functional Limits for tasks assessed                                 General Comments: decreased safety at times; pt is very independent natured      General Comments      Exercises Total Joint Exercises Ankle Circles/Pumps: AROM;Both;20 reps;Supine Quad Sets: AROM;Both;15 reps;Supine Heel Slides: AAROM;Left;15 reps;Supine Straight Leg Raises: AAROM;Left;20 reps;Supine   Assessment/Plan    PT Assessment Patient needs continued PT services  PT Problem List Decreased strength;Decreased range of motion;Decreased activity tolerance;Decreased balance;Decreased mobility;Decreased knowledge of use of DME;Pain       PT Treatment Interventions DME instruction;Gait training;Stair training;Functional mobility training;Therapeutic activities;Therapeutic exercise;Patient/family education    PT Goals (Current goals can be found in the Care Plan section)  Acute Rehab PT Goals Patient Stated Goal: home and feel good this time PT Goal Formulation: With patient Time For Goal Achievement: 09/28/17 Potential to Achieve Goals: Good    Frequency 7X/week   Barriers to discharge        Co-evaluation               AM-PAC PT "6 Clicks" Daily Activity  Outcome Measure Difficulty turning over in bed (including adjusting bedclothes, sheets and blankets)?: A Lot Difficulty moving from lying on back to sitting on the side of the bed? : A Lot Difficulty sitting down on and standing up from a chair with arms (e.g., wheelchair, bedside commode,  etc,.)?: A Little Help needed moving to and from a bed to chair (including a wheelchair)?: A Little Help needed walking in hospital room?: A Little Help needed climbing 3-5 steps with a railing? : A Little 6 Click Score: 16    End of Session Equipment Utilized During Treatment: Gait belt Activity Tolerance: Patient tolerated treatment well Patient left: in chair;with call bell/phone within reach;with family/visitor present Nurse Communication: Mobility status PT Visit Diagnosis: Unsteadiness on feet (R26.81);Difficulty in walking, not elsewhere classified (R26.2);Pain Pain - Right/Left: Left Pain - part of body: Knee    Time: 1227-1300 PT Time Calculation (min) (ACUTE ONLY): 33 min   Charges:   PT Evaluation $PT Eval Low Complexity: 1 Low PT Treatments $Therapeutic Exercise: 8-22 mins   PT G Codes:   PT G-Codes **NOT FOR INPATIENT CLASS** Functional Assessment Tool Used: AM-PAC 6 Clicks Basic Mobility Functional Limitation: Mobility: Walking and moving around Mobility: Walking and Moving Around Current Status (D6222): At least 40 percent but less than 60 percent impaired, limited or restricted Mobility: Walking and Moving Around Goal Status [phone]): At least 20 percent but less than 40 percent impaired, limited or restricted    Pg [phone]   Gustavo Meditz 09/29/2017, 1:43 PM

## 2017-09-29 NOTE — Evaluation (Signed)
Occupational Therapy Evaluation Patient Details Name: Katelyn [name] MRN: [id number] DOB: 11/21/1955 Today's Date: 09/29/2017    History of Present Illness Katelyn [name] is a 61 y.o. female with a past medical history of hypothyroidism, multiple allergies, osteoarthritis who recently underwent left total knee replacement on 11/14.  Patient was discharged home on 11/16.  According to the patient and her husband patient was a bit lethargic even at discharge.  She was having low-grade fevers prior to discharge.  She did not do a whole lot after she got home yesterday and slept for most of the day.  She had some nausea.  And then she developed a fever yesterday evening of 103 F.  Patient was brought back to the hospital.  She denies any headache   Clinical Impression   OT eval complete. No DME needs.  Pt and husband ask for a refresher in regards to ADL activity.    Follow Up Recommendations  Supervision/Assistance - 24 hour    Equipment Recommendations  3 in 1 bedside commode       Precautions / Restrictions Precautions Precautions: Knee;Fall      Mobility Bed Mobility               General bed mobility comments: oob   Transfers Overall transfer level: Needs assistance Equipment used: Rolling walker (2 wheeled) Transfers: Sit to/from [facility] Sit to Stand: Supervision Stand pivot transfers: Supervision            Balance Overall balance assessment: Needs assistance Sitting-balance support: Feet supported;No upper extremity supported Sitting balance-Leahy Scale: Good     Standing balance support: Bilateral upper extremity supported Standing balance-Leahy Scale: Poor                             ADL either performed or assessed with clinical judgement   ADL                            Pt overall S.  OT educated pt and husband in ADL activity s/p TKR.  Husband will A as needed                                 Pertinent Vitals/Pain Pain Score: 3  Pain Location: L knee Pain Descriptors / Indicators: Aching;Sore     Hand Dominance     Extremity/Trunk Assessment Upper Extremity Assessment Upper Extremity Assessment: Overall WFL for tasks assessed           Communication Communication Communication: No difficulties   Cognition Arousal/Alertness: Awake/alert Behavior During Therapy: WFL for tasks assessed/performed Overall Cognitive Status: Within Functional Limits for tasks assessed                                                Home Living Family/patient expects to be discharged to:: Private residence Living Arrangements: Spouse/significant other Available Help at Discharge: Family Type of Home: House Home Access: Stairs to enter Technical brewer of Steps: 4 Entrance Stairs-Rails: Right;Left Home Layout: One level               Home Equipment: Environmental consultant - 2 wheels;Walker - 4 wheels;Cane - single point;Crutches  Prior Functioning/Environment Level of Independence: Independent with assistive device(s);Independent        Comments: using crutches since scope earlier this year                 OT Goals(Current goals can be found in the care plan section) Acute Rehab OT Goals Patient Stated Goal: home and feel good this time  OT Frequency:                AM-PAC PT "6 Clicks" Daily Activity     Outcome Measure Help from another person eating meals?: None Help from another person taking care of personal grooming?: A Little Help from another person toileting, which includes using toliet, bedpan, or urinal?: A Little Help from another person bathing (including washing, rinsing, drying)?: A Little Help from another person to put on and taking off regular upper body clothing?: A Little Help from another person to put on and taking off regular lower body clothing?: A Lot 6 Click Score: 18   End of Session    Activity Tolerance:  Patient tolerated treatment well Patient left: in chair;with call bell/phone within reach;with family/visitor present  OT Visit Diagnosis: Pain;Unsteadiness on feet (R26.81);Repeated falls (R29.6) Pain - Right/Left: Left Pain - part of body: Knee                Time: 1120-1141 OT Time Calculation (min): 21 min Charges:  OT Evaluation $OT Eval Low Complexity: 1 Low G-Codes: OT G-codes **NOT FOR INPATIENT CLASS** Functional Assessment Tool Used: Clinical judgement Functional Limitation: Self care Self Care Current Status (I5038): At least 1 percent but less than 20 percent impaired, limited or restricted Self Care Goal Status (U8280): At least 1 percent but less than 20 percent impaired, limited or restricted Self Care Discharge Status [phone]): At least 1 percent but less than 20 percent impaired, limited or restricted   Kari Baars, [place] [facility] [place]  Payton Mccallum D 09/29/2017, 12:22 PM

## 2017-09-29 NOTE — Progress Notes (Addendum)
PROGRESS NOTE    Kinzlie Harney Stash  ZYS:[id number] DOB: 06-06-1956 DOA: 09/28/2017 PCP: Marletta Lor, MD   Brief Narrative:  61 y.o. female with a past medical history of hypothyroidism, multiple allergies, osteoarthritis who recently underwent left total knee replacement on 11/14.  Patient was discharged home on 11/16.  According to the patient and her husband patient was a bit lethargic even at discharge.  She was having low-grade fevers prior to discharge.  Patient developed temperature of 103 the night prior to admission and she was brought back to the hospital for further evaluation.  Assessment & Plan:  #Fever likely due to post surgical atelectasis: CT chest with no pneumonia however consistent with atelectasis.  Patient does not have chest pain, shortness of breath or cough.  UA negative.  She does not have any urinary symptoms.  Clinically improving.  The surgical site with no sign of infection.  Follow-up culture result and trend temperature curve.  Continue Tylenol and supportive care. -incentive spirometer  #Acute hypoxia in the ER while sleeping: CT scan with no PE.  Unknown if it was in the setting of sedatives.  Currently on room air and denied any pulmonary symptoms.  #Hypokalemia: Replete potassium chloride and magnesium.  Repeat labs in the morning.  #Recent left knee arthroplasty: Seen by orthopedics, no sign of infection.  PT OT evaluation and supportive care.  #History of migraine: Patient reported mild headache and asking to resume atenolol.  Medication resume.  Continue supportive care.  #History of hypothyroidism continue Synthroid.  #Chronic normocytic anemia: Monitor hemoglobin.  No sign of bleeding.  #Lethargy at home likely acute metabolic encephalopathy in the setting of sedatives: Now her mental status back to baseline.  No focal neurological deficit.  Try to minimize sedatives.  DVT prophylaxis: Lovenox subcutaneous Code Status: Full code Family  Communication: Discussed with the patient's husband and other family members at bedside Disposition Plan: Likely discharge home if patient feels better, cultures negative.    Consultants:   Orthopedics  Procedures: None Antimicrobials: None  Subjective: Seen and examined at bedside.  Patient reported generalized weakness fatigue and migrainous headache.  She reported not feeling well today.  Denied dizziness, nausea, vomiting, chest pain, shortness of breath, abdominal pain, dysuria, urgency, diarrhea.  Family members at bedside.  Objective: Vitals:   09/28/17 0912 09/28/17 1400 09/28/17 2132 09/29/17 0506  BP: (!) 132/55 (!) 121/48 (!) 149/72 (!) 152/80  Pulse: 99 80 (!) 111 (!) 108  Resp: 18 16 16 16   Temp: 98.1 F (36.7 C) 99 F (37.2 C) 99.9 F (37.7 C) 99.8 F (37.7 C)  TempSrc:   Oral Oral  SpO2: 100% 100% 96% 100%  Weight:      Height:        Intake/Output Summary (Last 24 hours) at 09/29/2017 1400 Last data filed at 09/29/2017 0600 Gross per 24 hour  Intake 1870 ml  Output -  Net 1870 ml   Filed Weights   09/28/17 0341  Weight: 78.5 kg (173 lb)    Examination:  General exam: Appears calm and comfortable  Respiratory system: Clear to auscultation. Respiratory effort normal. No wheezing or crackle Cardiovascular system: S1 & S2 heard, RRR.  No pedal edema. Gastrointestinal system: Abdomen is nondistended, soft and nontender. Normal bowel sounds heard. Central nervous system: Alert and oriented. No focal neurological deficits. Extremities: Symmetric 5 x 5 power.  Knee surgical site healing well with dressing. Skin: No rashes, lesions or ulcers Psychiatry: Judgement and insight appear  normal. Mood & affect appropriate.     Data Reviewed: I have personally reviewed following labs and imaging studies  CBC: Recent Labs  Lab 09/26/17 0519 09/27/17 0524 09/28/17 0126 09/29/17 0446  WBC 8.8 7.3 9.2 6.4  NEUTROABS  --   --  7.3  --   HGB 12.1 11.9*  11.3* 10.2*  HCT 35.3* 36.1 33.6* 31.1*  MCV 85.7 87.6 86.6 87.9  PLT 235 194 207 400   Basic Metabolic Panel: Recent Labs  Lab 09/26/17 0519 09/27/17 0524 09/28/17 0126 09/29/17 0446  NA 138 134* 132* 137  K 3.6 3.1* 2.9* 3.2*  CL 101 97* 96* 104  CO2 30 31 27 26   GLUCOSE 118* 106* 124* 107*  BUN 8 10 9  5*  CREATININE 0.76 0.85 0.81 0.60  CALCIUM 8.6* 8.3* 8.1* 8.0*  MG  --   --   --  1.7   GFR: Estimated Creatinine Clearance: 79.7 mL/min (by C-G formula based on SCr of 0.6 mg/dL). Liver Function Tests: Recent Labs  Lab 09/28/17 0126 09/29/17 0446  AST 19 17  ALT 22 18  ALKPHOS 64 59  BILITOT 1.0 0.8  PROT 6.2* 5.8*  ALBUMIN 3.1* 2.7*   No results for input(s): LIPASE, AMYLASE in the last 168 hours. Recent Labs  Lab 09/28/17 0933  AMMONIA 14   Coagulation Profile: No results for input(s): INR, PROTIME in the last 168 hours. Cardiac Enzymes: No results for input(s): CKTOTAL, CKMB, CKMBINDEX, TROPONINI in the last 168 hours. BNP (last 3 results) No results for input(s): PROBNP in the last 8760 hours. HbA1C: No results for input(s): HGBA1C in the last 72 hours. CBG: No results for input(s): GLUCAP in the last 168 hours. Lipid Profile: No results for input(s): CHOL, HDL, LDLCALC, TRIG, CHOLHDL, LDLDIRECT in the last 72 hours. Thyroid Function Tests: No results for input(s): TSH, T4TOTAL, FREET4, T3FREE, THYROIDAB in the last 72 hours. Anemia Panel: No results for input(s): VITAMINB12, FOLATE, FERRITIN, TIBC, IRON, RETICCTPCT in the last 72 hours. Sepsis Labs: Recent Labs  Lab 09/28/17 0143 09/28/17 0443  LATICACIDVEN 1.05 0.36*    Recent Results (from the past 240 hour(s))  Surgical pcr screen     Status: None   Collection Time: 09/20/17  2:20 PM  Result Value Ref Range Status   MRSA, PCR NEGATIVE NEGATIVE Final   Staphylococcus aureus NEGATIVE NEGATIVE Final    Comment: (NOTE) The Xpert SA Assay (FDA approved for NASAL specimens in patients  71 years of age and older), is one component of a comprehensive surveillance program. It is not intended to diagnose infection nor to guide or monitor treatment.   Blood Culture (routine x 2)     Status: None (Preliminary result)   Collection Time: 09/28/17  1:28 AM  Result Value Ref Range Status   Specimen Description BLOOD LEFT ANTECUBITAL  Final   Special Requests   Final    BOTTLES DRAWN AEROBIC AND ANAEROBIC Blood Culture adequate volume   Culture   Final    NO GROWTH 1 DAY Performed at [facility], [place] [address] [place], [place] [address]    Report Status PENDING  Incomplete  Urine culture     Status: None   Collection Time: 09/28/17  1:28 AM  Result Value Ref Range Status   Specimen Description URINE, RANDOM  Final   Special Requests NONE  Final   Culture   Final    NO GROWTH Performed at [facility], [address], [place], [place] [address]    Report Status 09/29/2017 FINAL  Final  Blood Culture (routine x 2)     Status: None (Preliminary result)   Collection Time: 09/28/17  1:33 AM  Result Value Ref Range Status   Specimen Description BLOOD LEFT HAND  Final   Special Requests   Final    BOTTLES DRAWN AEROBIC AND ANAEROBIC Blood Culture adequate volume   Culture   Final    NO GROWTH 1 DAY Performed at [facility], [place] [address] [place], [place] [address]    Report Status PENDING  Incomplete  Rapid Strep Screen (Not at [facility])     Status: None   Collection Time: 09/28/17 11:50 AM  Result Value Ref Range Status   Streptococcus, Group A Screen (Direct) NEGATIVE NEGATIVE Final    Comment: (NOTE) A Rapid Antigen test may result negative if the antigen level in the sample is below the detection level of this test. The FDA has not cleared this test as a stand-alone test therefore the rapid antigen negative result has reflexed to a Group A Strep culture.          Radiology Studies: Dg Chest 2 View  Result Date: 09/28/2017 CLINICAL  DATA:  Acute onset of fever. EXAM: CHEST  2 VIEW COMPARISON:  Chest radiograph performed 09/20/2017 FINDINGS: The lungs are well-aerated. Mild bibasilar linear atelectasis is noted. There is no evidence of pleural effusion or pneumothorax. The heart is normal in size; the mediastinal contour is within normal limits. No acute osseous abnormalities are seen. IMPRESSION: Mild bibasilar linear atelectasis noted. No definite evidence for focal airspace consolidation. Electronically Signed   By: Garald Balding M.D.   On: 09/28/2017 02:04   Ct Head Wo Contrast  Result Date: 09/28/2017 CLINICAL DATA:  Initial evaluation for acute altered mental status. EXAM: CT HEAD WITHOUT CONTRAST TECHNIQUE: Contiguous axial images were obtained from the base of the skull through the vertex without intravenous contrast. COMPARISON:  None. FINDINGS: Brain: Cerebral volume within normal limits for patient age. No evidence for acute intracranial hemorrhage. No findings to suggest acute large vessel territory infarct. No mass lesion, midline shift, or mass effect. Ventricles are normal in size without evidence for hydrocephalus. No extra-axial fluid collection identified. Vascular: No hyperdense vessel identified. Skull: Scalp soft tissues demonstrate no acute abnormality.Calvarium intact. Sinuses/Orbits: Globes and orbital soft tissues are within normal limits. Visualized paranasal sinuses are clear. Chronic left mastoid effusion. Trace right mastoid effusion noted as well. IMPRESSION: 1. No acute intracranial process. 2. Chronic appearing left mastoid effusion. Electronically Signed   By: Jeannine Boga M.D.   On: 09/28/2017 05:22   Ct Angio Chest Pe W And/or Wo Contrast  Result Date: 09/28/2017 CLINICAL DATA:  Acute onset of fever and tachycardia. Recent left knee replacement. Confusion. EXAM: CT ANGIOGRAPHY CHEST WITH CONTRAST TECHNIQUE: Multidetector CT imaging of the chest was performed using the standard protocol during  bolus administration of intravenous contrast. Multiplanar CT image reconstructions and MIPs were obtained to evaluate the vascular anatomy. CONTRAST:  100 mL ISOVUE-370 IOPAMIDOL (ISOVUE-370) INJECTION 76% COMPARISON:  Chest radiograph performed earlier today at 1:44 a.m., and CT of the chest performed 05/28/2012 FINDINGS: Cardiovascular:  There is no evidence of pulmonary embolus. The heart is normal in size. Mild mural thrombus is suggested along the proximal great vessels, without significant luminal narrowing. Mediastinum/Nodes: The mediastinum is otherwise unremarkable. No mediastinal lymphadenopathy is seen. No pericardial effusion is identified. The thyroid gland is unremarkable. No axillary  lymphadenopathy is seen. Lungs/Pleura: Mild bibasilar atelectasis or scarring is noted. No pleural effusion or pneumothorax is seen. No masses are identified. Upper Abdomen: The visualized portions of the liver and spleen are unremarkable. The gallbladder is grossly unremarkable in appearance. The visualized portions of the pancreas and right adrenal gland are within normal limits. Musculoskeletal: No acute osseous abnormalities are identified. There is mild chronic deformity of the body of the sternum. The visualized musculature is unremarkable in appearance. Review of the MIP images confirms the above findings. IMPRESSION: 1. No evidence of pulmonary embolus. 2. Mild bibasilar atelectasis or scarring noted. Lungs otherwise clear. Electronically Signed   By: Garald Balding M.D.   On: 09/28/2017 05:40        Scheduled Meds: . atenolol  25 mg Oral Daily  . enoxaparin (LOVENOX) injection  40 mg Subcutaneous Q24H  . levothyroxine  150 mcg Oral QAC breakfast  . mirabegron ER  25 mg Oral Daily  . potassium chloride  40 mEq Oral BID  . tamsulosin  0.4 mg Oral Daily   Continuous Infusions:   LOS: 0 days    Brytney Somes Tanna Furry, MD [facility] Pager [phone]  If 7PM-7AM, please contact  night-coverage [email or web address] Password [email or web address] 09/29/2017, 2:00 PM

## 2017-09-30 DIAGNOSIS — E876 Hypokalemia: Secondary | ICD-10-CM | POA: Diagnosis not present

## 2017-09-30 DIAGNOSIS — R509 Fever, unspecified: Secondary | ICD-10-CM | POA: Diagnosis not present

## 2017-09-30 DIAGNOSIS — J9811 Atelectasis: Secondary | ICD-10-CM | POA: Diagnosis not present

## 2017-09-30 LAB — BASIC METABOLIC PANEL
ANION GAP: 6 (ref 5–15)
BUN: 6 mg/dL (ref 6–20)
CALCIUM: 8.1 mg/dL — AB (ref 8.9–10.3)
CO2: 24 mmol/L (ref 22–32)
Chloride: 106 mmol/L (ref 101–111)
Creatinine, Ser: 0.58 mg/dL (ref 0.44–1.00)
GFR calc Af Amer: >60 mL/min (ref >60)
GLUCOSE: 119 mg/dL — AB (ref 65–99)
Potassium: 4.1 mmol/L (ref 3.5–5.1)
Sodium: 136 mmol/L (ref 135–145)

## 2017-09-30 LAB — MAGNESIUM: Magnesium: 1.9 mg/dL (ref 1.7–2.4)

## 2017-09-30 NOTE — Progress Notes (Signed)
   Subjective:    Patient reports pain as mild.   Patient seen in rounds for Dr. Gladstone Lighter Patient is well, and has had no acute complaints or problems. Labs improved. No fever. Voiding well. Positive BM.  Plan is to go Home after hospital stay.  Objective: Vital signs in last 24 hours: Temp:  [98.6 F (37 C)-100.2 F (37.9 C)] 98.6 F (37 C) (11/19 0743) Pulse Rate:  [83-109] 83 (11/18 2044) Resp:  [17-18] 18 (11/19 0458) BP: (133-155)/(58-71) 141/71 (11/19 0458) SpO2:  [95 %-100 %] 95 % (11/19 0458)  Intake/Output from previous day:  Intake/Output Summary (Last 24 hours) at 09/30/2017 0848 Last data filed at 09/30/2017 0458 Gross per 24 hour  Intake 1385 ml  Output 300 ml  Net 1085 ml     Labs: Recent Labs    09/28/17 0126 09/29/17 0446  HGB 11.3* 10.2*   Recent Labs    09/28/17 0126 09/29/17 0446  WBC 9.2 6.4  RBC 3.88 3.54*  HCT 33.6* 31.1*  PLT 207 223   Recent Labs    09/29/17 0446 09/30/17 0445  NA 137 136  K 3.2* 4.1  CL 104 106  CO2 26 24  BUN 5* 6  CREATININE 0.60 0.58  GLUCOSE 107* 119*  CALCIUM 8.0* 8.1*    EXAM General - Patient is Alert and Oriented Extremity - Neurologically intact Sensation intact distally Intact pulses distally Dorsiflexion/Plantar flexion intact No cellulitis present Compartment soft Dressing/Incision - clean, dry, no drainage Motor Function - intact, moving foot and toes well on exam.   Past Medical History:  Diagnosis Date  . Anemia   . Headache(784.0)    cluster headaches, migraines- states is taking Atenolol at present as directed per Dr Lacinda Axon  to prevent migraine from increased stress-  states if on medication for several weeks that heart rate will drop to 40's  . History of kidney stones   . HYPOTHYROIDISM 05/27/2007  . Pneumonia    post op x 2 last yrs ago  . PONV (postoperative nausea and vomiting)    likes phenergan with anesthesia, has woken up with 2 surgeries per patient   . WEIGHT GAIN  07/15/2009    Assessment/Plan:    Principal Problem:   Fever of unknown origin Active Problems:   Hypothyroidism   Hypokalemia   History of total knee arthroplasty, left   Atelectasis  Estimated body mass index is 27.1 kg/m as calculated from the following:   Height as of this encounter: 5\' 7"  (1.702 m).   Weight as of this encounter: 78.5 kg (173 lb). Advance diet Up with therapy  DVT Prophylaxis - Aspirin Weight-Bearing as tolerated   DC home when medically stable. She will start outpatient therapy at [facility] day after DC. She will not need HHPT.   Ardeen Jourdain, PA-C Orthopaedic Surgery 09/30/2017, 8:48 AM

## 2017-09-30 NOTE — Progress Notes (Signed)
Physical Therapy Treatment Patient Details Name: KILAH DRAHOS MRN: [id number] DOB: 04-20-1956 Today's Date: 09/30/2017    History of Present Illness Leianne Callins Kussman is a 61 y.o. female with a past medical history of hypothyroidism, multiple allergies, osteoarthritis who recently underwent left total knee replacement on 11/14.  Patient was discharged home on 11/16.  According to the patient and her husband patient was a bit lethargic even at discharge.  She was having low-grade fevers prior to discharge.  She did not do a whole lot after she got home yesterday and slept for most of the day.  She had some nausea.  And then she developed a fever yesterday evening of 103 F.  Patient was brought back to the hospital.  She denies any headache    PT Comments    POD #5 re admit 2nd fever.   Dressed and eager to go home (feeling much better). Instructed on KI use for stairs and proper application.   Performed all TKR TE's following HEP handout.  Instructed on proper tech, freq as well as proper positioning while at rest.  Encouraged knee presses as pt lacks approx 15 degrees of extension.  Pt stated she does not like and won't use it "makes it worse".  Addressed all mobility questions.  Ready to D/C to home with spouse.    Follow Up Recommendations  Outpatient PT;DC plan and follow up therapy as arranged by surgeon     Equipment Recommendations  None recommended by PT    Recommendations for Other Services       Precautions / Restrictions Precautions Precautions: Knee;Fall Precaution Comments: instructed on KI use esp for stairs  Required Braces or Orthoses: Knee Immobilizer - Left Knee Immobilizer - Left: Discontinue once straight leg raise with < 10 degree lag Restrictions Weight Bearing Restrictions: No Other Position/Activity Restrictions: WBAT          Cognition Arousal/Alertness: Awake/alert Behavior During Therapy: WFL for tasks assessed/performed Overall Cognitive Status:  Within Functional Limits for tasks assessed                                 General Comments: decreased safety at times; pt is very independent natured      Exercises   Total Knee Replacement TE's 10 reps B LE ankle pumps 10 reps towel squeezes 10 reps knee presses 10 reps heel slides  05 reps SAQ's 10 reps SLR's 10 reps ABD 10 knee bends 05 assisted knee bends 05 assisted LAQ's     General Comments        Pertinent Vitals/Pain Pain Assessment: 0-10 Pain Score: 2  Pain Location: L knee Pain Descriptors / Indicators: Tightness Pain Intervention(s): Monitored during session;Repositioned    Home Living                      Prior Function            PT Goals (current goals can now be found in the care plan section) Progress towards PT goals: Progressing toward goals    Frequency    7X/week      PT Plan Current plan remains appropriate    Co-evaluation              AM-PAC PT "6 Clicks" Daily Activity  Outcome Measure  Difficulty turning over in bed (including adjusting bedclothes, sheets and blankets)?: A Little Difficulty moving from lying on back to  sitting on the side of the bed? : A Little Difficulty sitting down on and standing up from a chair with arms (e.g., wheelchair, bedside commode, etc,.)?: A Little Help needed moving to and from a bed to chair (including a wheelchair)?: A Lot Help needed walking in hospital room?: A Lot Help needed climbing 3-5 steps with a railing? : A Lot 6 Click Score: 15    End of Session   Activity Tolerance: Patient tolerated treatment well Patient left: in chair;with call bell/phone within reach;with family/visitor present Nurse Communication: (pt ready for D/C to home) PT Visit Diagnosis: Unsteadiness on feet (R26.81);Difficulty in walking, not elsewhere classified (R26.2);Pain Pain - Right/Left: Left Pain - part of body: Knee     Time: 1610-9604 PT Time Calculation (min) (ACUTE  ONLY): 25 min  Charges:  $Therapeutic Exercise: 8-22 mins $Self Care/Home Management: 8-22                    G Codes:       Rica Koyanagi  PTA [facility]  Acute  Rehab Pager      [phone]

## 2017-09-30 NOTE — Discharge Summary (Signed)
Physician Discharge Summary   Patient ID: Katelyn [name] MRN: [id number] DOB/AGE: [id number] 61 y.o.  Admit date: 09/25/2017 Discharge date: 09/27/2017  Primary Diagnosis: Primary osteoarthritis left knee   Admission Diagnoses:  Past Medical History:  Diagnosis Date  . Anemia   . Headache(784.0)    cluster headaches, migraines- states is taking Atenolol at present as directed per Dr Lacinda Axon  to prevent migraine from increased stress-  states if on medication for several weeks that heart rate will drop to 40's  . History of kidney stones   . HYPOTHYROIDISM 05/27/2007  . Pneumonia    post op x 2 last yrs ago  . PONV (postoperative nausea and vomiting)    likes phenergan with anesthesia, has woken up with 2 surgeries per patient   . WEIGHT GAIN 07/15/2009   Discharge Diagnoses:   Active Problems:   History of total knee arthroplasty, left  Estimated body mass index is 27.1 kg/m as calculated from the following:   Height as of this encounter: _0  (1.702 m).   Weight as of this encounter: 78.5 kg (173 lb).  Procedure:  Procedure(s) (LRB): LEFT TOTAL KNEE ARTHROPLASTY (Left)   Consults: None  HPI: Katelyn [name], 61 y.o. female, has a history of pain and functional disability in the left knee due to arthritis and has failed non-surgical conservative treatments for greater than 12 weeks to includeNSAID's and/or analgesics, corticosteriod injections, flexibility and strengthening excercises, weight reduction as appropriate and activity modification.  Onset of symptoms was gradual, starting >10 years ago with gradually worsening course since that time. The patient noted prior procedures on the knee to include  arthroscopy and menisectomy on the left knee(s).  Patient currently rates pain in the left knee(s) at 8 out of 10 with activity. Patient has night pain, worsening of pain with activity and weight bearing, pain that interferes with activities of daily living, pain with  passive range of motion, crepitus and joint swelling.  Patient has evidence of periarticular osteophytes and joint space narrowing by imaging studies. There is no active infection.    Laboratory Data: Admission on 09/25/2017, Discharged on 09/27/2017  Component Date Value Ref Range Status  . WBC 09/26/2017 8.8  4.0 - 10.5 K/uL Final  . RBC 09/26/2017 4.12  3.87 - 5.11 MIL/uL Final  . Hemoglobin 09/26/2017 12.1  12.0 - 15.0 g/dL Final  . HCT 09/26/2017 35.3* 36.0 - 46.0 % Final  . MCV 09/26/2017 85.7  78.0 - 100.0 fL Final  . MCH 09/26/2017 29.4  26.0 - 34.0 pg Final  . MCHC 09/26/2017 34.3  30.0 - 36.0 g/dL Final  . RDW 09/26/2017 13.3  11.5 - 15.5 % Final  . Platelets 09/26/2017 235  150 - 400 K/uL Final  . Sodium 09/26/2017 138  135 - 145 mmol/L Final  . Potassium 09/26/2017 3.6  3.5 - 5.1 mmol/L Final  . Chloride 09/26/2017 101  101 - 111 mmol/L Final  . CO2 09/26/2017 30  22 - 32 mmol/L Final  . Glucose, Bld 09/26/2017 118* 65 - 99 mg/dL Final  . BUN 09/26/2017 8  6 - 20 mg/dL Final  . Creatinine, Ser 09/26/2017 0.76  0.44 - 1.00 mg/dL Final  . Calcium 09/26/2017 8.6* 8.9 - 10.3 mg/dL Final  . GFR calc non Af Amer 09/26/2017 >60  >60 mL/min Final  . GFR calc Af Amer 09/26/2017 >60  >60 mL/min Final   Comment: (NOTE) The eGFR has been calculated using the CKD EPI equation. This  calculation has not been validated in all clinical situations. eGFR's persistently <60 mL/min signify possible Chronic Kidney Disease.   . Anion gap 09/26/2017 7  5 - 15 Final  . WBC 09/27/2017 7.3  4.0 - 10.5 K/uL Final  . RBC 09/27/2017 4.12  3.87 - 5.11 MIL/uL Final  . Hemoglobin 09/27/2017 11.9* 12.0 - 15.0 g/dL Final  . HCT 09/27/2017 36.1  36.0 - 46.0 % Final  . MCV 09/27/2017 87.6  78.0 - 100.0 fL Final  . MCH 09/27/2017 28.9  26.0 - 34.0 pg Final  . MCHC 09/27/2017 33.0  30.0 - 36.0 g/dL Final  . RDW 09/27/2017 13.7  11.5 - 15.5 % Final  . Platelets 09/27/2017 194  150 - 400 K/uL Final  .  Sodium 09/27/2017 134* 135 - 145 mmol/L Final  . Potassium 09/27/2017 3.1* 3.5 - 5.1 mmol/L Final  . Chloride 09/27/2017 97* 101 - 111 mmol/L Final  . CO2 09/27/2017 31  22 - 32 mmol/L Final  . Glucose, Bld 09/27/2017 106* 65 - 99 mg/dL Final  . BUN 09/27/2017 10  6 - 20 mg/dL Final  . Creatinine, Ser 09/27/2017 0.85  0.44 - 1.00 mg/dL Final  . Calcium 09/27/2017 8.3* 8.9 - 10.3 mg/dL Final  . GFR calc non Af Amer 09/27/2017 >60  >60 mL/min Final  . GFR calc Af Amer 09/27/2017 >60  >60 mL/min Final   Comment: (NOTE) The eGFR has been calculated using the CKD EPI equation. This calculation has not been validated in all clinical situations. eGFR's persistently <60 mL/min signify possible Chronic Kidney Disease.   Georgiann Hahn gap 09/27/2017 6  5 - 15 Final  Hospital Outpatient Visit on 09/20/2017  Component Date Value Ref Range Status  . aPTT 09/20/2017 29  24 - 36 seconds Final  . WBC 09/20/2017 7.4  4.0 - 10.5 K/uL Final  . RBC 09/20/2017 4.65  3.87 - 5.11 MIL/uL Final  . Hemoglobin 09/20/2017 13.4  12.0 - 15.0 g/dL Final  . HCT 09/20/2017 39.5  36.0 - 46.0 % Final  . MCV 09/20/2017 84.9  78.0 - 100.0 fL Final  . MCH 09/20/2017 28.8  26.0 - 34.0 pg Final  . MCHC 09/20/2017 33.9  30.0 - 36.0 g/dL Final  . RDW 09/20/2017 13.2  11.5 - 15.5 % Final  . Platelets 09/20/2017 279  150 - 400 K/uL Final  . Neutrophils Relative % 09/20/2017 58  % Final  . Neutro Abs 09/20/2017 4.3  1.7 - 7.7 K/uL Final  . Lymphocytes Relative 09/20/2017 32  % Final  . Lymphs Abs 09/20/2017 2.4  0.7 - 4.0 K/uL Final  . Monocytes Relative 09/20/2017 8  % Final  . Monocytes Absolute 09/20/2017 0.6  0.1 - 1.0 K/uL Final  . Eosinophils Relative 09/20/2017 2  % Final  . Eosinophils Absolute 09/20/2017 0.1  0.0 - 0.7 K/uL Final  . Basophils Relative 09/20/2017 0  % Final  . Basophils Absolute 09/20/2017 0.0  0.0 - 0.1 K/uL Final  . Sodium 09/20/2017 141  135 - 145 mmol/L Final  . Potassium 09/20/2017 3.9  3.5 - 5.1  mmol/L Final  . Chloride 09/20/2017 109  101 - 111 mmol/L Final  . CO2 09/20/2017 27  22 - 32 mmol/L Final  . Glucose, Bld 09/20/2017 93  65 - 99 mg/dL Final  . BUN 09/20/2017 13  6 - 20 mg/dL Final  . Creatinine, Ser 09/20/2017 0.81  0.44 - 1.00 mg/dL Final  . Calcium 09/20/2017 9.0  8.9 -  10.3 mg/dL Final  . Total Protein 09/20/2017 7.0  6.5 - 8.1 g/dL Final  . Albumin 09/20/2017 3.9  3.5 - 5.0 g/dL Final  . AST 09/20/2017 23  15 - 41 U/L Final  . ALT 09/20/2017 24  14 - 54 U/L Final  . Alkaline Phosphatase 09/20/2017 75  38 - 126 U/L Final  . Total Bilirubin 09/20/2017 0.8  0.3 - 1.2 mg/dL Final  . GFR calc non Af Amer 09/20/2017 >60  >60 mL/min Final  . GFR calc Af Amer 09/20/2017 >60  >60 mL/min Final   Comment: (NOTE) The eGFR has been calculated using the CKD EPI equation. This calculation has not been validated in all clinical situations. eGFR's persistently <60 mL/min signify possible Chronic Kidney Disease.   . Anion gap 09/20/2017 5  5 - 15 Final  . Prothrombin Time 09/20/2017 12.4  11.4 - 15.2 seconds Final  . INR 09/20/2017 0.93   Final  . ABO/RH(D) 09/20/2017 O POS   Final  . Antibody Screen 09/20/2017 NEG   Final  . Sample Expiration 09/20/2017 09/28/2017   Final  . Extend sample reason 09/20/2017 NO TRANSFUSIONS OR PREGNANCY IN THE PAST 3 MONTHS   Final  . Color, Urine 09/20/2017 YELLOW  YELLOW Final  . APPearance 09/20/2017 CLEAR  CLEAR Final  . Specific Gravity, Urine 09/20/2017 1.024  1.005 - 1.030 Final  . pH 09/20/2017 5.0  5.0 - 8.0 Final  . Glucose, UA 09/20/2017 NEGATIVE  NEGATIVE mg/dL Final  . Hgb urine dipstick 09/20/2017 MODERATE* NEGATIVE Final  . Bilirubin Urine 09/20/2017 NEGATIVE  NEGATIVE Final  . Ketones, ur 09/20/2017 NEGATIVE  NEGATIVE mg/dL Final  . Protein, ur 09/20/2017 NEGATIVE  NEGATIVE mg/dL Final  . Nitrite 09/20/2017 NEGATIVE  NEGATIVE Final  . Leukocytes, UA 09/20/2017 NEGATIVE  NEGATIVE Final  . RBC / HPF 09/20/2017 0-5  0 - 5  RBC/hpf Final  . WBC, UA 09/20/2017 0-5  0 - 5 WBC/hpf Final  . Bacteria, UA 09/20/2017 NONE SEEN  NONE SEEN Final  . Squamous Epithelial / LPF 09/20/2017 0-5* NONE SEEN Final  . Mucus 09/20/2017 PRESENT   Final  . MRSA, PCR 09/20/2017 NEGATIVE  NEGATIVE Final  . Staphylococcus aureus 09/20/2017 NEGATIVE  NEGATIVE Final   Comment: (NOTE) The Xpert SA Assay (FDA approved for NASAL specimens in patients 41 years of age and older), is one component of a comprehensive surveillance program. It is not intended to diagnose infection nor to guide or monitor treatment.   . ABO/RH(D) 09/20/2017 O POS   Final  Office Visit on 08/27/2017  Component Date Value Ref Range Status  . TSH 08/27/2017 12.19* 0.35 - 4.50 uIU/mL Final  Admission on 08/19/2017, Discharged on 08/19/2017  Component Date Value Ref Range Status  . Hemoglobin 08/19/2017 13.9  12.0 - 15.0 g/dL Final     X-Rays:Dg Chest 2 View  Result Date: 09/28/2017 CLINICAL DATA:  Acute onset of fever. EXAM: CHEST  2 VIEW COMPARISON:  Chest radiograph performed 09/20/2017 FINDINGS: The lungs are well-aerated. Mild bibasilar linear atelectasis is noted. There is no evidence of pleural effusion or pneumothorax. The heart is normal in size; the mediastinal contour is within normal limits. No acute osseous abnormalities are seen. IMPRESSION: Mild bibasilar linear atelectasis noted. No definite evidence for focal airspace consolidation. Electronically Signed   By: Garald Balding M.D.   On: 09/28/2017 02:04   Dg Chest 2 View  Result Date: 09/20/2017 CLINICAL DATA:  Pre-op respiratory exam for left knee surgery.  EXAM: CHEST  2 VIEW COMPARISON:  01/08/2017 FINDINGS: The heart size and mediastinal contours are within normal limits. Aortic atherosclerosis. Stable scarring seen in anterior right middle lobe. No evidence of pulmonary infiltrate or pleural effusion . IMPRESSION: Stable exam.  No active cardiopulmonary disease. Electronically Signed   By: Earle Gell M.D.   On: 09/20/2017 16:57   Ct Head Wo Contrast  Result Date: 09/28/2017 CLINICAL DATA:  Initial evaluation for acute altered mental status. EXAM: CT HEAD WITHOUT CONTRAST TECHNIQUE: Contiguous axial images were obtained from the base of the skull through the vertex without intravenous contrast. COMPARISON:  None. FINDINGS: Brain: Cerebral volume within normal limits for patient age. No evidence for acute intracranial hemorrhage. No findings to suggest acute large vessel territory infarct. No mass lesion, midline shift, or mass effect. Ventricles are normal in size without evidence for hydrocephalus. No extra-axial fluid collection identified. Vascular: No hyperdense vessel identified. Skull: Scalp soft tissues demonstrate no acute abnormality.Calvarium intact. Sinuses/Orbits: Globes and orbital soft tissues are within normal limits. Visualized paranasal sinuses are clear. Chronic left mastoid effusion. Trace right mastoid effusion noted as well. IMPRESSION: 1. No acute intracranial process. 2. Chronic appearing left mastoid effusion. Electronically Signed   By: Jeannine Boga M.D.   On: 09/28/2017 05:22   Ct Angio Chest Pe W And/or Wo Contrast  Result Date: 09/28/2017 CLINICAL DATA:  Acute onset of fever and tachycardia. Recent left knee replacement. Confusion. EXAM: CT ANGIOGRAPHY CHEST WITH CONTRAST TECHNIQUE: Multidetector CT imaging of the chest was performed using the standard protocol during bolus administration of intravenous contrast. Multiplanar CT image reconstructions and MIPs were obtained to evaluate the vascular anatomy. CONTRAST:  100 mL ISOVUE-370 IOPAMIDOL (ISOVUE-370) INJECTION 76% COMPARISON:  Chest radiograph performed earlier today at 1:44 a.m., and CT of the chest performed 05/28/2012 FINDINGS: Cardiovascular:  There is no evidence of pulmonary embolus. The heart is normal in size. Mild mural thrombus is suggested along the proximal great vessels, without significant  luminal narrowing. Mediastinum/Nodes: The mediastinum is otherwise unremarkable. No mediastinal lymphadenopathy is seen. No pericardial effusion is identified. The thyroid gland is unremarkable. No axillary lymphadenopathy is seen. Lungs/Pleura: Mild bibasilar atelectasis or scarring is noted. No pleural effusion or pneumothorax is seen. No masses are identified. Upper Abdomen: The visualized portions of the liver and spleen are unremarkable. The gallbladder is grossly unremarkable in appearance. The visualized portions of the pancreas and right adrenal gland are within normal limits. Musculoskeletal: No acute osseous abnormalities are identified. There is mild chronic deformity of the body of the sternum. The visualized musculature is unremarkable in appearance. Review of the MIP images confirms the above findings. IMPRESSION: 1. No evidence of pulmonary embolus. 2. Mild bibasilar atelectasis or scarring noted. Lungs otherwise clear. Electronically Signed   By: Garald Balding M.D.   On: 09/28/2017 05:40    EKG: Orders placed or performed during the hospital encounter of 09/20/17  . EKG  . EKG     Hospital Course: RAIMA GEATHERS is a 61 y.o. who was admitted to [facility]. They were brought to the operating room on 09/25/2017 and underwent Procedure(s): LEFT TOTAL KNEE ARTHROPLASTY.  Patient tolerated the procedure well and was later transferred to the recovery room and then to the orthopaedic floor for postoperative care.  They were given PO and IV analgesics for pain control following their surgery.  They were given 24 hours of postoperative antibiotics of  Anti-infectives (From admission, onward)   Start  Dose/Rate Route Frequency Ordered Stop   09/25/17 2030  vancomycin (VANCOCIN) IVPB 1000 mg/200 mL premix     1,000 mg 200 mL/hr over 60 Minutes Intravenous Every 12 hours 09/25/17 1407 09/25/17 2101   09/25/17 1009  polymyxin B 500,000 Units, bacitracin 50,000 Units in sodium  chloride 0.9 % 500 mL irrigation  Status:  Discontinued       As needed 09/25/17 1010 09/25/17 1120   09/25/17 0815  vancomycin (VANCOCIN) 1-5 GM/200ML-% IVPB    Comments:  Bridget Hartshorn   : cabinet override      09/25/17 0815 09/25/17 0825   09/25/17 0650  vancomycin (VANCOCIN) IVPB 1000 mg/200 mL premix     1,000 mg 200 mL/hr over 60 Minutes Intravenous On call to O.R. 09/25/17 0175 09/25/17 0925     and started on DVT prophylaxis in the form of Xarelto.   PT and OT were ordered for total joint protocol.  Discharge planning consulted to help with postop disposition and equipment needs.  Patient had a fair night on the evening of surgery.  They started to get up OOB with therapy on day one. Hemovac drain was pulled without difficulty.  Continued to work with therapy into day two.  Dressing was changed on day two and the incision was clean and dry. The patient had progressed with therapy and meeting their goals.  Incision was healing well.  Patient was seen in rounds and was ready to go home. Patient was transitioned to aspirin 32m BID for DVT prophylaxis.   Diet: Cardiac diet Activity:WBAT Follow-up:in 2 weeks Disposition - Home Discharged Condition: stable   Discharge Instructions    Call MD / Call 911   Complete by:  As directed    If you experience chest pain or shortness of breath, CALL 911 and be transported to the hospital emergency room.  If you develope a fever above 101 F, pus (white drainage) or increased drainage or redness at the wound, or calf pain, call your surgeon's office.   Constipation Prevention   Complete by:  As directed    Drink plenty of fluids.  Prune juice may be helpful.  You may use a stool softener, such as Colace (over the counter) 100 mg twice a day.  Use MiraLax (over the counter) for constipation as needed.   Diet - low sodium heart healthy   Complete by:  As directed    Discharge instructions   Complete by:  As directed    INSTRUCTIONS AFTER JOINT  REPLACEMENT   Remove items at home which could result in a fall. This includes throw rugs or furniture in walking pathways ICE to the affected joint every three hours while awake for 30 minutes at a time, for at least the first 3-5 days, and then as needed for pain and swelling.  Continue to use ice for pain and swelling. You may notice swelling that will progress down to the foot and ankle.  This is normal after surgery.  Elevate your leg when you are not up walking on it.   Continue to use the breathing machine you got in the hospital (incentive spirometer) which will help keep your temperature down.  It is common for your temperature to cycle up and down following surgery, especially at night when you are not up moving around and exerting yourself.  The breathing machine keeps your lungs expanded and your temperature down.   DIET:  As you were doing prior to hospitalization, we recommend a well-balanced  diet.  DRESSING / WOUND CARE / SHOWERING  You may change your dressing every day with sterile gauze.  Please use good hand washing techniques before changing the dressing.  Do not use any lotions or creams on the incision until instructed by your surgeon.  ACTIVITY  Increase activity slowly as tolerated, but follow the weight bearing instructions below.   No driving for 6 weeks or until further direction given by your physician.  You cannot drive while taking narcotics.  No lifting or carrying greater than 10 lbs. until further directed by your surgeon. Avoid periods of inactivity such as sitting longer than an hour when not asleep. This helps prevent blood clots.  You may return to work once you are authorized by your doctor.     WEIGHT BEARING   Weight bearing as tolerated with assist device (walker, cane, etc) as directed, use it as long as suggested by your surgeon or therapist, typically at least 4-6 weeks.   EXERCISES  Results after joint replacement surgery are often greatly  improved when you follow the exercise, range of motion and muscle strengthening exercises prescribed by your doctor. Safety measures are also important to protect the joint from further injury. Any time any of these exercises cause you to have increased pain or swelling, decrease what you are doing until you are comfortable again and then slowly increase them. If you have problems or questions, call your caregiver or physical therapist for advice.   Rehabilitation is important following a joint replacement. After just a few days of immobilization, the muscles of the leg can become weakened and shrink (atrophy).  These exercises are designed to build up the tone and strength of the thigh and leg muscles and to improve motion. Often times heat used for twenty to thirty minutes before working out will loosen up your tissues and help with improving the range of motion but do not use heat for the first two weeks following surgery (sometimes heat can increase post-operative swelling).   These exercises can be done on a training (exercise) mat, on the floor, on a table or on a bed. Use whatever works the best and is most comfortable for you.    Use music or television while you are exercising so that the exercises are a pleasant break in your day. This will make your life better with the exercises acting as a break in your routine that you can look forward to.   Perform all exercises about fifteen times, three times per day or as directed.  You should exercise both the operative leg and the other leg as well.  Exercises include:   Quad Sets - Tighten up the muscle on the front of the thigh (Quad) and hold for 5-10 seconds.   Straight Leg Raises - With your knee straight (if you were given a brace, keep it on), lift the leg to 60 degrees, hold for 3 seconds, and slowly lower the leg.  Perform this exercise against resistance later as your leg gets stronger.  Leg Slides: Lying on your back, slowly slide your foot  toward your buttocks, bending your knee up off the floor (only go as far as is comfortable). Then slowly slide your foot back down until your leg is flat on the floor again.  Angel Wings: Lying on your back spread your legs to the side as far apart as you can without causing discomfort.  Hamstring Strength:  Lying on your back, push your heel against the floor  with your leg straight by tightening up the muscles of your buttocks.  Repeat, but this time bend your knee to a comfortable angle, and push your heel against the floor.  You may put a pillow under the heel to make it more comfortable if necessary.   A rehabilitation program following joint replacement surgery can speed recovery and prevent re-injury in the future due to weakened muscles. Contact your doctor or a physical therapist for more information on knee rehabilitation.    CONSTIPATION  Constipation is defined medically as fewer than three stools per week and severe constipation as less than one stool per week.  Even if you have a regular bowel pattern at home, your normal regimen is likely to be disrupted due to multiple reasons following surgery.  Combination of anesthesia, postoperative narcotics, change in appetite and fluid intake all can affect your bowels.   YOU MUST use at least one of the following options; they are listed in order of increasing strength to get the job done.  They are all available over the counter, and you may need to use some, POSSIBLY even all of these options:    Drink plenty of fluids (prune juice may be helpful) and high fiber foods Colace 100 mg by mouth twice a day  Senokot for constipation as directed and as needed Dulcolax (bisacodyl), take with full glass of water  Miralax (polyethylene glycol) once or twice a day as needed.  If you have tried all these things and are unable to have a bowel movement in the first 3-4 days after surgery call either your surgeon or your primary doctor.    If you  experience loose stools or diarrhea, hold the medications until you stool forms back up.  If your symptoms do not get better within 1 week or if they get worse, check with your doctor.  If you experience "the worst abdominal pain ever" or develop nausea or vomiting, please contact the office immediately for further recommendations for treatment.   ITCHING:  If you experience itching with your medications, try taking only a single pain pill, or even half a pain pill at a time.  You can also use Benadryl over the counter for itching or also to help with sleep.   TED HOSE STOCKINGS:  Use stockings on both legs until for at least 2 weeks or as directed by physician office. They may be removed at night for sleeping.  MEDICATIONS:  See your medication summary on the "After Visit Summary" that nursing will review with you.  You may have some home medications which will be placed on hold until you complete the course of blood thinner medication.  It is important for you to complete the blood thinner medication as prescribed.  PRECAUTIONS:  If you experience chest pain or shortness of breath - call 911 immediately for transfer to the hospital emergency department.   If you develop a fever greater that 101 F, purulent drainage from wound, increased redness or drainage from wound, foul odor from the wound/dressing, or calf pain - CONTACT YOUR SURGEON.                                                   FOLLOW-UP APPOINTMENTS:  If you do not already have a post-op appointment, please call the office for an appointment to be seen  by your surgeon.  Guidelines for how soon to be seen are listed in your "After Visit Summary", but are typically between 1-4 weeks after surgery.    MAKE SURE YOU:  Understand these instructions.  Get help right away if you are not doing well or get worse.    Thank you for letting us be a part of your medical care team.  It is a privilege we respect greatly.  We hope these  instructions will help you stay on track for a fast and full recovery!   Increase activity slowly as tolerated   Complete by:  As directed      Allergies as of 09/27/2017      Reactions   Adhesive [tape] Swelling, Other (See Comments)   Reaction:  Eats in skin   Ciprofloxacin Hives, Shortness Of Breath   Dimethicone Hives, Shortness Of Breath   Erythromycin Hives, Shortness Of Breath   Latex Hives, Shortness Of Breath   Other Hives, Shortness Of Breath, Other (See Comments)   Pt is allergic to nitrile gloves.  Pt is allergic to antibacterial soaps- rash - states she only uses Dove soap  SCDs- patient states - had itching with this    Penicillins Hives, Shortness Of Breath, Other (See Comments)   Has patient had a PCN reaction causing immediate rash, facial/tongue/throat swelling, SOB or lightheadedness with hypotension: Yes Has patient had a PCN reaction causing severe rash involving mucus membranes or skin necrosis: No Has patient had a PCN reaction that required hospitalization No Has patient had a PCN reaction occurring within the last 10 years: No If all of the above answers are "NO", then may proceed with Cephalosporin use.   Red Dye Hives, Shortness Of Breath   Shellfish Allergy Hives, Shortness Of Breath   Sulfonamide Derivatives Hives, Shortness Of Breath   Alcohol-sulfur [sulfur] Swelling, Other (See Comments)   Reaction:  Localized swelling  With alcohol   Aspirin Nausea And Vomiting   Doxycycline Nausea And Vomiting   Hibiclens [chlorhexidine]    Rash    Ceftin [cefuroxime] Rash      Medication List    STOP taking these medications   HYDROmorphone 2 MG tablet Commonly known as:  DILAUDID     TAKE these medications   albuterol 108 (90 Base) MCG/ACT inhaler Commonly known as:  PROVENTIL HFA;VENTOLIN HFA Inhale 2 puffs into the lungs every 6 (six) hours as needed for wheezing or shortness of breath.   aspirin EC 325 MG tablet Take 1 tablet (325 mg total) 2  (two) times daily by mouth.   atenolol 25 MG tablet Commonly known as:  TENORMIN TAKE 1 TABLET (25 MG TOTAL) BY MOUTH EVERY MORNING. FOR MIGRAINES What changed:    when to take this  reasons to take this  additional instructions   Biotin 1 MG Caps Take 1 mg by mouth daily.   cholecalciferol 1000 units tablet Commonly known as:  VITAMIN D Take 1,000 Units by mouth daily.   Cranberry 1000 MG Caps Take 1,000 mg by mouth daily.   cyclobenzaprine 10 MG tablet Commonly known as:  FLEXERIL Take 1 tablet (10 mg total) every 8 (eight) hours as needed by mouth for muscle spasms.   levothyroxine 150 MCG tablet Commonly known as:  SYNTHROID, LEVOTHROID Take 1 tablet (150 mcg total) by mouth daily.   MYRBETRIQ 25 MG Tb24 tablet Generic drug:  mirabegron ER Take 25 mg by mouth daily.   ondansetron 4 MG disintegrating tablet Commonly known as:  ZOFRAN ODT Take 1 tablet (4 mg total) by mouth every 8 (eight) hours as needed for nausea or vomiting.   oxyCODONE 5 MG immediate release tablet Commonly known as:  Oxy IR/ROXICODONE Take 1-3 tablets (5-15 mg total) every 3 (three) hours as needed by mouth for breakthrough pain.   phentermine 30 MG capsule TAKE ONE CAPSULE BY MOUTH DAILY   promethazine 25 MG tablet Commonly known as:  PHENERGAN Take 1 tablet (25 mg total) every 8 (eight) hours as needed by mouth for refractory nausea / vomiting (if response to Zofran not sufficient).   tamsulosin 0.4 MG Caps capsule Commonly known as:  FLOMAX Take 0.4 mg by mouth daily.   Vitamin B-12 2500 MCG Subl Place 2,500 mcg under the tongue daily.      Follow-up Information    Latanya Maudlin, MD. Schedule an appointment as soon as possible for a visit in 2 week(s).   Specialty:  Orthopedic Surgery Contact information: [address] [place] [address] [phone]           Signed: Ardeen Jourdain, PA-C Orthopaedic Surgery 09/30/2017, 8:56 AM

## 2017-09-30 NOTE — Discharge Summary (Signed)
Physician Discharge Summary  Katelyn [name] RKY:[id number] DOB: [birth year] DOA: 09/28/2017  PCP: Marletta Lor, MD  Admit date: 09/28/2017 Discharge date: 09/30/2017  Admitted From:home Disposition:home  Recommendations for Outpatient Follow-up:  1. Follow up with PCP in 1-2 weeks 2. Please obtain BMP/CBC in one week   Home Health:no Equipment/Devices:no Discharge Condition:stable CODE STATUS:full code Diet recommendation:heart healthy  Brief/Interim Summary: 61 y.o.femalewith a past medical history of hypothyroidism, multiple allergies, osteoarthritis who recently underwent left total knee replacement on 11/14. Patient was discharged home on 11/16. According to the patient and her husband patient was a bitlethargic even at discharge. She was having low-grade fevers prior to discharge.  Patient developed temperature of 103 the night prior to admission and she was brought back to the hospital for further evaluation.  #Fever likely due to post surgical atelectasis: CT chest with no pneumonia however consistent with atelectasis.  Patient does not have chest pain, shortness of breath or cough.  UA negative.  She does not have any urinary symptoms.    Clinically improved.  Recommended to continue incentive spirometer.  Patient is afebrile now.  Cultures negative.  #Acute hypoxia in the ER while sleeping: CT scan with no PE.  Unknown if it was in the setting of sedatives.  Currently on room air and denied any pulmonary symptoms.  #Hypokalemia: Stable.  Recommended to monitor labs with PCP.  Patient reports that she has a potassium pills at home and does not that prescription.  #Recent left knee arthroplasty: Seen by orthopedics, no sign of infection.  PT OT evaluation and supportive care.  Patient goes for rehab outside of the house and does not need home care services.  #History of migraine: Clinically improved.  #History of hypothyroidism continue  Synthroid.  #Chronic normocytic anemia:  #Lethargy at home likely acute metabolic encephalopathy in the setting of sedatives: Mental status improved and back to baseline.  No focal neurological deficit.  Patient is clinically improved.  Afebrile.  Cultures negative and no sign of infection.  Family members at bedside.  Patient reported that she goes outside of the house for rehab which she will continue after discharge.  Recommended to follow-up with PCP and orthopedics.     Discharge Diagnoses:  Principal Problem:   Fever of unknown origin Active Problems:   Hypothyroidism   Hypokalemia   History of total knee arthroplasty, left   Atelectasis    Discharge Instructions  Discharge Instructions    Call MD for:  difficulty breathing, headache or visual disturbances   Complete by:  As directed    Call MD for:  extreme fatigue   Complete by:  As directed    Call MD for:  hives   Complete by:  As directed    Call MD for:  persistant dizziness or light-headedness   Complete by:  As directed    Call MD for:  persistant nausea and vomiting   Complete by:  As directed    Call MD for:  severe uncontrolled pain   Complete by:  As directed    Call MD for:  temperature >100.4   Complete by:  As directed    Diet - low sodium heart healthy   Complete by:  As directed    Discharge instructions   Complete by:  As directed    Continue outpatient rehab   Increase activity slowly   Complete by:  As directed      Allergies as of 09/30/2017      Reactions  Adhesive [tape] Swelling, Other (See Comments)   Reaction:  Eats in skin   Ciprofloxacin Hives, Shortness Of Breath   Dimethicone Hives, Shortness Of Breath   Erythromycin Hives, Shortness Of Breath   Latex Hives, Shortness Of Breath   Other Hives, Shortness Of Breath, Other (See Comments)   Pt is allergic to nitrile gloves.  Pt is allergic to antibacterial soaps- rash - states she only uses Dove soap  SCDs- patient states -  had itching with this    Penicillins Hives, Shortness Of Breath, Other (See Comments)   Has patient had a PCN reaction causing immediate rash, facial/tongue/throat swelling, SOB or lightheadedness with hypotension: Yes Has patient had a PCN reaction causing severe rash involving mucus membranes or skin necrosis: No Has patient had a PCN reaction that required hospitalization No Has patient had a PCN reaction occurring within the last 10 years: No If all of the above answers are "NO", then may proceed with Cephalosporin use.   Red Dye Hives, Shortness Of Breath   Shellfish Allergy Hives, Shortness Of Breath   Sulfonamide Derivatives Hives, Shortness Of Breath   Alcohol-sulfur [sulfur] Swelling, Other (See Comments)   Reaction:  Localized swelling  With alcohol   Aspirin Nausea And Vomiting   Doxycycline Nausea And Vomiting   Hibiclens [chlorhexidine]    Rash    Ceftin [cefuroxime] Rash      Medication List    TAKE these medications   albuterol 108 (90 Base) MCG/ACT inhaler Commonly known as:  PROVENTIL HFA;VENTOLIN HFA Inhale 2 puffs into the lungs every 6 (six) hours as needed for wheezing or shortness of breath.   aspirin EC 325 MG tablet Take 1 tablet (325 mg total) 2 (two) times daily by mouth.   atenolol 25 MG tablet Commonly known as:  TENORMIN TAKE 1 TABLET (25 MG TOTAL) BY MOUTH EVERY MORNING. FOR MIGRAINES What changed:    when to take this  reasons to take this  additional instructions   Biotin 1 MG Caps Take 1 mg by mouth daily.   cholecalciferol 1000 units tablet Commonly known as:  VITAMIN D Take 1,000 Units by mouth daily.   Cranberry 1000 MG Caps Take 1,000 mg by mouth daily.   cyclobenzaprine 10 MG tablet Commonly known as:  FLEXERIL Take 1 tablet (10 mg total) every 8 (eight) hours as needed by mouth for muscle spasms.   levothyroxine 150 MCG tablet Commonly known as:  SYNTHROID, LEVOTHROID Take 1 tablet (150 mcg total) by mouth daily.    MYRBETRIQ 25 MG Tb24 tablet Generic drug:  mirabegron ER Take 25 mg by mouth daily.   ondansetron 4 MG disintegrating tablet Commonly known as:  ZOFRAN ODT Take 1 tablet (4 mg total) by mouth every 8 (eight) hours as needed for nausea or vomiting.   oxyCODONE 5 MG immediate release tablet Commonly known as:  Oxy IR/ROXICODONE Take 1-3 tablets (5-15 mg total) every 3 (three) hours as needed by mouth for breakthrough pain.   phentermine 30 MG capsule TAKE ONE CAPSULE BY MOUTH DAILY   promethazine 25 MG tablet Commonly known as:  PHENERGAN Take 1 tablet (25 mg total) every 8 (eight) hours as needed by mouth for refractory nausea / vomiting (if response to Zofran not sufficient).   tamsulosin 0.4 MG Caps capsule Commonly known as:  FLOMAX Take 0.4 mg by mouth daily.   Vitamin B-12 2500 MCG Subl Place 2,500 mcg under the tongue daily.      Follow-up Information  Marletta Lor, MD. Schedule an appointment as soon as possible for a visit in 1 week(s).   Specialty:  Internal Medicine Contact information: [place] [address] [phone]        Latanya Maudlin, MD. Schedule an appointment as soon as possible for a visit in 2 week(s).   Specialty:  Orthopedic Surgery Contact information: [address] [place] [address] [phone]          Allergies  Allergen Reactions  . Adhesive [Tape] Swelling and Other (See Comments)    Reaction:  Eats in skin  . Ciprofloxacin Hives and Shortness Of Breath  . Dimethicone Hives and Shortness Of Breath  . Erythromycin Hives and Shortness Of Breath  . Latex Hives and Shortness Of Breath  . Other Hives, Shortness Of Breath and Other (See Comments)    Pt is allergic to nitrile gloves.  Pt is allergic to antibacterial soaps- rash - states she only uses Dove soap  SCDs- patient states - had itching with this   . Penicillins Hives, Shortness Of Breath and Other (See Comments)     Has patient had a PCN reaction causing immediate rash, facial/tongue/throat swelling, SOB or lightheadedness with hypotension: Yes Has patient had a PCN reaction causing severe rash involving mucus membranes or skin necrosis: No Has patient had a PCN reaction that required hospitalization No Has patient had a PCN reaction occurring within the last 10 years: No If all of the above answers are "NO", then may proceed with Cephalosporin use.  . Red Dye Hives and Shortness Of Breath  . Shellfish Allergy Hives and Shortness Of Breath  . Sulfonamide Derivatives Hives and Shortness Of Breath  . Alcohol-Sulfur [Sulfur] Swelling and Other (See Comments)    Reaction:  Localized swelling  With alcohol   . Aspirin Nausea And Vomiting  . Doxycycline Nausea And Vomiting  . Hibiclens [Chlorhexidine]     Rash   . Ceftin [Cefuroxime] Rash    Consultations: Orthopedics  Procedures/Studies: None  Subjective: Seen and examined at bedside.  Denies headache, dizziness, nausea, vomiting, chest pain, shortness of breath, dysuria, urgency, diarrhea or constipation.  No knee pain.  Discharge Exam: Vitals:   09/30/17 0743 09/30/17 0945  BP:  (!) 148/62  Pulse:  98  Resp:    Temp: 98.6 F (37 C)   SpO2:     Vitals:   09/29/17 2044 09/30/17 0458 09/30/17 0743 09/30/17 0945  BP: 133/70 (!) 141/71  (!) 148/62  Pulse: 83   98  Resp: 17 18    Temp: 99.3 F (37.4 C) 99.6 F (37.6 C) 98.6 F (37 C)   TempSrc: Oral Oral Oral   SpO2: 99% 95%    Weight:      Height:        General: Pt is alert, awake, not in acute distress Cardiovascular: RRR, S1/S2 +, no rubs, no gallops Respiratory: CTA bilaterally, no wheezing, no rhonchi Abdominal: Soft, NT, ND, bowel sounds + Extremities: no edema, no cyanosis    The results of significant diagnostics from this hospitalization (including imaging, microbiology, ancillary and laboratory) are listed below for reference.     Microbiology: Recent Results  (from the past 240 hour(s))  Surgical pcr screen     Status: None   Collection Time: 09/20/17  2:20 PM  Result Value Ref Range Status   MRSA, PCR NEGATIVE NEGATIVE Final   Staphylococcus aureus NEGATIVE NEGATIVE Final    Comment: (NOTE) The Xpert SA  Assay (FDA approved for NASAL specimens in patients 26 years of age and older), is one component of a comprehensive surveillance program. It is not intended to diagnose infection nor to guide or monitor treatment.   Blood Culture (routine x 2)     Status: None (Preliminary result)   Collection Time: 09/28/17  1:28 AM  Result Value Ref Range Status   Specimen Description BLOOD LEFT ANTECUBITAL  Final   Special Requests   Final    BOTTLES DRAWN AEROBIC AND ANAEROBIC Blood Culture adequate volume   Culture   Final    NO GROWTH 1 DAY Performed at [facility], [place] [address] [place], [place] [address]    Report Status PENDING  Incomplete  Urine culture     Status: None   Collection Time: 09/28/17  1:28 AM  Result Value Ref Range Status   Specimen Description URINE, RANDOM  Final   Special Requests NONE  Final   Culture   Final    NO GROWTH Performed at [facility], [address], [place], [place] [address]    Report Status 09/29/2017 FINAL  Final  Blood Culture (routine x 2)     Status: None (Preliminary result)   Collection Time: 09/28/17  1:33 AM  Result Value Ref Range Status   Specimen Description BLOOD LEFT HAND  Final   Special Requests   Final    BOTTLES DRAWN AEROBIC AND ANAEROBIC Blood Culture adequate volume   Culture   Final    NO GROWTH 1 DAY Performed at [facility], [place] [address] [place], [place] [address]    Report Status PENDING  Incomplete  Rapid Strep Screen (Not at [facility])     Status: None   Collection Time: 09/28/17 11:50 AM  Result Value Ref Range Status   Streptococcus, Group A Screen (Direct) NEGATIVE NEGATIVE Final    Comment: (NOTE) A Rapid Antigen test may result negative if the  antigen level in the sample is below the detection level of this test. The FDA has not cleared this test as a stand-alone test therefore the rapid antigen negative result has reflexed to a Group A Strep culture.   Culture, group A strep     Status: None (Preliminary result)   Collection Time: 09/28/17 11:50 AM  Result Value Ref Range Status   Specimen Description   Final    THROAT Performed at [facility], [address] [place], [place] [address]    Special Requests   Final    NONE Performed at [facility], [address], [place], [place] [address]    Culture   Final    CULTURE REINCUBATED FOR BETTER GROWTH Performed at [facility], [place] [address], [place], [place] [address]    Report Status PENDING  Incomplete     Labs: BNP (last 3 results) No results for input(s): BNP in the last 8760 hours. Basic Metabolic Panel: Recent Labs  Lab 09/26/17 0519 09/27/17 0524 09/28/17 0126 09/29/17 0446 09/30/17 0445  NA 138 134* 132* 137 136  K 3.6 3.1* 2.9* 3.2* 4.1  CL 101 97* 96* 104 106  CO2 30 31 27 26 24   GLUCOSE 118* 106* 124* 107* 119*  BUN 8 10 9  5* 6  CREATININE 0.76 0.85 0.81 0.60 0.58  CALCIUM 8.6* 8.3* 8.1* 8.0* 8.1*  MG  --   --   --  1.7 1.9   Liver Function Tests: Recent Labs  Lab 09/28/17 0126  09/29/17 0446  AST 19 17  ALT 22 18  ALKPHOS 64 59  BILITOT 1.0 0.8  PROT 6.2* 5.8*  ALBUMIN 3.1* 2.7*   No results for input(s): LIPASE, AMYLASE in the last 168 hours. Recent Labs  Lab 09/28/17 0933  AMMONIA 14   CBC: Recent Labs  Lab 09/26/17 0519 09/27/17 0524 09/28/17 0126 09/29/17 0446  WBC 8.8 7.3 9.2 6.4  NEUTROABS  --   --  7.3  --   HGB 12.1 11.9* 11.3* 10.2*  HCT 35.3* 36.1 33.6* 31.1*  MCV 85.7 87.6 86.6 87.9  PLT 235 194 207 223   Cardiac Enzymes: No results for input(s): CKTOTAL, CKMB, CKMBINDEX, TROPONINI in the last 168 hours. BNP: Invalid input(s): POCBNP CBG: No results for input(s): GLUCAP in the last 168  hours. D-Dimer No results for input(s): DDIMER in the last 72 hours. Hgb A1c No results for input(s): HGBA1C in the last 72 hours. Lipid Profile No results for input(s): CHOL, HDL, LDLCALC, TRIG, CHOLHDL, LDLDIRECT in the last 72 hours. Thyroid function studies No results for input(s): TSH, T4TOTAL, T3FREE, THYROIDAB in the last 72 hours.  Invalid input(s): FREET3 Anemia work up No results for input(s): VITAMINB12, FOLATE, FERRITIN, TIBC, IRON, RETICCTPCT in the last 72 hours. Urinalysis    Component Value Date/Time   COLORURINE YELLOW 09/28/2017 0126   APPEARANCEUR CLEAR 09/28/2017 0126   LABSPEC 1.010 09/28/2017 0126   PHURINE 6.0 09/28/2017 0126   GLUCOSEU NEGATIVE 09/28/2017 0126   HGBUR MODERATE (A) 09/28/2017 0126   BILIRUBINUR NEGATIVE 09/28/2017 0126   KETONESUR 20 (A) 09/28/2017 0126   PROTEINUR NEGATIVE 09/28/2017 0126   UROBILINOGEN 0.2 08/30/2011 2014   NITRITE NEGATIVE 09/28/2017 0126   LEUKOCYTESUR NEGATIVE 09/28/2017 0126   Sepsis Labs Invalid input(s): PROCALCITONIN,  WBC,  LACTICIDVEN Microbiology Recent Results (from the past 240 hour(s))  Surgical pcr screen     Status: None   Collection Time: 09/20/17  2:20 PM  Result Value Ref Range Status   MRSA, PCR NEGATIVE NEGATIVE Final   Staphylococcus aureus NEGATIVE NEGATIVE Final    Comment: (NOTE) The Xpert SA Assay (FDA approved for NASAL specimens in patients 60 years of age and older), is one component of a comprehensive surveillance program. It is not intended to diagnose infection nor to guide or monitor treatment.   Blood Culture (routine x 2)     Status: None (Preliminary result)   Collection Time: 09/28/17  1:28 AM  Result Value Ref Range Status   Specimen Description BLOOD LEFT ANTECUBITAL  Final   Special Requests   Final    BOTTLES DRAWN AEROBIC AND ANAEROBIC Blood Culture adequate volume   Culture   Final    NO GROWTH 1 DAY Performed at [facility], [place] [address] [place],  [place] [address]    Report Status PENDING  Incomplete  Urine culture     Status: None   Collection Time: 09/28/17  1:28 AM  Result Value Ref Range Status   Specimen Description URINE, RANDOM  Final   Special Requests NONE  Final   Culture   Final    NO GROWTH Performed at [facility], [address] [place], [place] [address]    Report Status 09/29/2017 FINAL  Final  Blood Culture (routine x 2)     Status: None (Preliminary result)   Collection Time: 09/28/17  1:33 AM  Result Value Ref Range Status   Specimen Description BLOOD LEFT HAND  Final   Special  Requests   Final    BOTTLES DRAWN AEROBIC AND ANAEROBIC Blood Culture adequate volume   Culture   Final    NO GROWTH 1 DAY Performed at [facility], [place] [address] [place], [place] [address]    Report Status PENDING  Incomplete  Rapid Strep Screen (Not at [facility])     Status: None   Collection Time: 09/28/17 11:50 AM  Result Value Ref Range Status   Streptococcus, Group A Screen (Direct) NEGATIVE NEGATIVE Final    Comment: (NOTE) A Rapid Antigen test may result negative if the antigen level in the sample is below the detection level of this test. The FDA has not cleared this test as a stand-alone test therefore the rapid antigen negative result has reflexed to a Group A Strep culture.   Culture, group A strep     Status: None (Preliminary result)   Collection Time: 09/28/17 11:50 AM  Result Value Ref Range Status   Specimen Description   Final    THROAT Performed at [facility], [address], [place], [place] [address]    Special Requests   Final    NONE Performed at [facility], [address] [place], [place] [address]    Culture   Final    CULTURE REINCUBATED FOR BETTER GROWTH Performed at [facility], [place] [address] [place], [place] [address]    Report Status PENDING  Incomplete     Time coordinating discharge: 28 minutes  SIGNED:   Rosita Fire, MD  [facility] 09/30/2017,  10:13 AM  If 7PM-7AM, please contact night-coverage [email or web address] Password [email or web address]

## 2017-10-01 ENCOUNTER — Telehealth: Payer: Self-pay | Admitting: *Deleted

## 2017-10-01 DIAGNOSIS — M25562 Pain in left knee: Secondary | ICD-10-CM | POA: Diagnosis not present

## 2017-10-01 LAB — CULTURE, GROUP A STREP (THRC)

## 2017-10-01 NOTE — Telephone Encounter (Signed)
Unable to reach patient at time of TCM Call. Left message for patient to return call when available.  

## 2017-10-02 NOTE — Telephone Encounter (Signed)
Unable to reach patient at time of TCM Call. Left message for patient to return call when available.  

## 2017-10-03 LAB — CULTURE, BLOOD (ROUTINE X 2)
Culture: NO GROWTH
Culture: NO GROWTH
SPECIAL REQUESTS: ADEQUATE
Special Requests: ADEQUATE

## 2017-10-09 DIAGNOSIS — M25562 Pain in left knee: Secondary | ICD-10-CM | POA: Diagnosis not present

## 2017-10-11 DIAGNOSIS — Z471 Aftercare following joint replacement surgery: Secondary | ICD-10-CM | POA: Diagnosis not present

## 2017-10-11 DIAGNOSIS — Z96652 Presence of left artificial knee joint: Secondary | ICD-10-CM | POA: Diagnosis not present

## 2017-10-15 DIAGNOSIS — M25562 Pain in left knee: Secondary | ICD-10-CM | POA: Diagnosis not present

## 2017-10-17 DIAGNOSIS — M25562 Pain in left knee: Secondary | ICD-10-CM | POA: Diagnosis not present

## 2017-10-24 ENCOUNTER — Encounter: Payer: Self-pay | Admitting: Internal Medicine

## 2017-10-24 ENCOUNTER — Ambulatory Visit (INDEPENDENT_AMBULATORY_CARE_PROVIDER_SITE_OTHER): Payer: 59 | Admitting: Internal Medicine

## 2017-10-24 VITALS — BP 150/88 | HR 83 | Temp 98.3°F | Ht 67.0 in | Wt 176.0 lb

## 2017-10-24 DIAGNOSIS — E860 Dehydration: Secondary | ICD-10-CM

## 2017-10-24 DIAGNOSIS — E032 Hypothyroidism due to medicaments and other exogenous substances: Secondary | ICD-10-CM

## 2017-10-24 DIAGNOSIS — I10 Essential (primary) hypertension: Secondary | ICD-10-CM

## 2017-10-24 DIAGNOSIS — Z96652 Presence of left artificial knee joint: Secondary | ICD-10-CM

## 2017-10-24 NOTE — Progress Notes (Signed)
Subjective:    Patient ID: Katelyn [name], female    DOB: [birth year], 61 y.o.   MRN: [id number]  HPI  61 year old patient who is seen today for follow-up.  She has essential hypertension.  And hypothyroidism.  At the time of her last visit TSH was elevated and levothyroxine uptitrated She is about 4 weeks status post left total knee replacement surgery hospital course was complicated by metabolic encephalopathy with electrolyte imbalance Doing well today  Past Medical History:  Diagnosis Date  . Anemia   . Headache(784.0)    cluster headaches, migraines- states is taking Atenolol at present as directed per Dr Lacinda Axon  to prevent migraine from increased stress-  states if on medication for several weeks that heart rate will drop to 40's  . History of kidney stones   . HYPOTHYROIDISM 05/27/2007  . Pneumonia    post op x 2 last yrs ago  . PONV (postoperative nausea and vomiting)    likes phenergan with anesthesia, has woken up with 2 surgeries per patient   . WEIGHT GAIN 07/15/2009     Social History   Socioeconomic History  . Marital status: Married    Spouse name: Not on file  . Number of children: Not on file  . Years of education: Not on file  . Highest education level: Not on file  Social Needs  . Financial resource strain: Not on file  . Food insecurity - worry: Not on file  . Food insecurity - inability: Not on file  . Transportation needs - medical: Not on file  . Transportation needs - non-medical: Not on file  Occupational History  . Not on file  Tobacco Use  . Smoking status: Never Smoker  . Smokeless tobacco: Never Used  Substance and Sexual Activity  . Alcohol use: No  . Drug use: No  . Sexual activity: Yes  Other Topics Concern  . Not on file  Social History Narrative  . Not on file    Past Surgical History:  Procedure Laterality Date  . ABDOMINAL HYSTERECTOMY    . APPENDECTOMY    . CYSTOSCOPY WITH RETROGRADE PYELOGRAM, URETEROSCOPY AND STENT  PLACEMENT Right 04/26/2017   Procedure: CYSTOSCOPY WITH RETROGRADE PYELOGRAM, URETEROSCOPY AND STENT PLACEMENT;  Surgeon: Cleon Gustin, MD;  Location: [facility];  Service: Urology;  Laterality: Right;  . CYSTOSCOPY WITH RETROGRADE PYELOGRAM, URETEROSCOPY AND STENT PLACEMENT Right 08/19/2017   Procedure: CYSTOSCOPY WITH RETROGRADE PYELOGRAM, URETEROSCOPY;  Surgeon: Cleon Gustin, MD;  Location: [facility];  Service: Urology;  Laterality: Right;  . DIAGNOSTIC LAPAROSCOPY    . FOOT SURGERY  2007   tumor removed from right foot/   . HOLMIUM LASER APPLICATION Right 9/50/7225   Procedure: HOLMIUM LASER APPLICATION;  Surgeon: Cleon Gustin, MD;  Location: [facility];  Service: Urology;  Laterality: Right;  . KNEE ARTHROSCOPY Left    x 3  . LAPAROTOMY  10/30/2011   Procedure: EXPLORATORY LAPAROTOMY;  Surgeon: Janie Morning, MD;  Location: [facility];  Service: Gynecology;  Laterality: N/A;  . mortons neuroma removed from foot    . SALPINGOOPHORECTOMY  10/30/2011   Procedure: SALPINGO OOPHERECTOMY;  Surgeon: Janie Morning, MD;  Location: [facility];  Service: Gynecology;  Laterality: Right;  RIGHT  . TONSILLECTOMY    . TOTAL KNEE ARTHROPLASTY Left 09/25/2017   Procedure: LEFT TOTAL KNEE ARTHROPLASTY;  Surgeon: Latanya Maudlin, MD;  Location: [facility];  Service: Orthopedics;  Laterality: Left;  . TUBAL LIGATION    .  TYMPANOSTOMY TUBE PLACEMENT      Family History  Problem Relation Age of Onset  . Alzheimer's disease Mother   . Cervical cancer Mother   . Diabetes Sister   . Heart attack Brother   . Hypertension Neg Hx        family hx  . Heart disease Neg Hx        family hx    Allergies  Allergen Reactions  . Adhesive [Tape] Swelling and Other (See Comments)    Reaction:  Eats in skin  . Ciprofloxacin Hives and Shortness Of Breath  . Dimethicone Hives and Shortness Of Breath  . Erythromycin Hives and Shortness Of Breath  . Latex Hives and Shortness Of Breath  .  Other Hives, Shortness Of Breath and Other (See Comments)    Pt is allergic to nitrile gloves.  Pt is allergic to antibacterial soaps- rash - states she only uses Dove soap  SCDs- patient states - had itching with this   . Penicillins Hives, Shortness Of Breath and Other (See Comments)    Has patient had a PCN reaction causing immediate rash, facial/tongue/throat swelling, SOB or lightheadedness with hypotension: Yes Has patient had a PCN reaction causing severe rash involving mucus membranes or skin necrosis: No Has patient had a PCN reaction that required hospitalization No Has patient had a PCN reaction occurring within the last 10 years: No If all of the above answers are "NO", then may proceed with Cephalosporin use.  Marland Kitchen Pineapple Anaphylaxis  . Red Dye Hives and Shortness Of Breath  . Shellfish Allergy Hives and Shortness Of Breath  . Sulfonamide Derivatives Hives and Shortness Of Breath  . Alcohol-Sulfur [Sulfur] Swelling and Other (See Comments)    Reaction:  Localized swelling  With alcohol   . Aspirin Nausea And Vomiting  . Doxycycline Nausea And Vomiting  . Hibiclens [Chlorhexidine]     Rash   . Ceftin [Cefuroxime] Rash    Current Outpatient Medications on File Prior to Visit  Medication Sig Dispense Refill  . albuterol (PROVENTIL HFA;VENTOLIN HFA) 108 (90 Base) MCG/ACT inhaler Inhale 2 puffs into the lungs every 6 (six) hours as needed for wheezing or shortness of breath. 1 Inhaler 0  . aspirin EC 325 MG tablet Take 1 tablet (325 mg total) 2 (two) times daily by mouth. 30 tablet 0  . atenolol (TENORMIN) 25 MG tablet TAKE 1 TABLET (25 MG TOTAL) BY MOUTH EVERY MORNING. FOR MIGRAINES (Patient taking differently: Take 25 mg by mouth daily as needed. For migraines) 90 tablet 1  . Biotin 1 MG CAPS Take 1 mg by mouth daily.    . cholecalciferol (VITAMIN D) 1000 UNITS tablet Take 1,000 Units by mouth daily.     . Cranberry 1000 MG CAPS Take 1,000 mg by mouth daily.    .  Cyanocobalamin (VITAMIN B-12) 2500 MCG SUBL Place 2,500 mcg under the tongue daily.     . cyclobenzaprine (FLEXERIL) 10 MG tablet Take 1 tablet (10 mg total) every 8 (eight) hours as needed by mouth for muscle spasms. 60 tablet 0  . levothyroxine (SYNTHROID, LEVOTHROID) 150 MCG tablet Take 1 tablet (150 mcg total) by mouth daily. 90 tablet 3  . mirabegron ER (MYRBETRIQ) 25 MG TB24 tablet Take 25 mg by mouth daily.    . phentermine 30 MG capsule TAKE ONE CAPSULE BY MOUTH DAILY 90 capsule 0  . promethazine (PHENERGAN) 25 MG tablet Take 1 tablet (25 mg total) every 8 (eight) hours as needed by  mouth for refractory nausea / vomiting (if response to Zofran not sufficient). 30 tablet 0  . tamsulosin (FLOMAX) 0.4 MG CAPS capsule Take 0.4 mg by mouth daily.    . ondansetron (ZOFRAN ODT) 4 MG disintegrating tablet Take 1 tablet (4 mg total) by mouth every 8 (eight) hours as needed for nausea or vomiting. (Patient not taking: Reported on 09/28/2017) 30 tablet 0  . oxyCODONE (OXY IR/ROXICODONE) 5 MG immediate release tablet Take 1-3 tablets (5-15 mg total) every 3 (three) hours as needed by mouth for breakthrough pain. (Patient not taking: Reported on 10/24/2017) 80 tablet 0   No current facility-administered medications on file prior to visit.     BP (!) 150/88 (BP Location: Left Arm, Patient Position: Sitting, Cuff Size: Normal)   Pulse 83   Temp 98.3 F (36.8 C) (Oral)   Ht 5\' 7"  (1.702 m)   Wt 176 lb (79.8 kg)   SpO2 98%   BMI 27.57 kg/m     Review of Systems  Constitutional: Negative.   HENT: Negative for congestion, dental problem, hearing loss, rhinorrhea, sinus pressure, sore throat and tinnitus.   Eyes: Negative for pain, discharge and visual disturbance.  Respiratory: Negative for cough and shortness of breath.   Cardiovascular: Negative for chest pain, palpitations and leg swelling.  Gastrointestinal: Negative for abdominal distention, abdominal pain, blood in stool, constipation,  diarrhea, nausea and vomiting.  Genitourinary: Negative for difficulty urinating, dysuria, flank pain, frequency, hematuria, pelvic pain, urgency, vaginal bleeding, vaginal discharge and vaginal pain.  Musculoskeletal: Positive for arthralgias and gait problem. Negative for joint swelling.  Skin: Negative for rash.  Neurological: Negative for dizziness, syncope, speech difficulty, weakness, numbness and headaches.  Hematological: Negative for adenopathy.  Psychiatric/Behavioral: Negative for agitation, behavioral problems and dysphoric mood. The patient is not nervous/anxious.        Objective:   Physical Exam  Constitutional: She is oriented to person, place, and time. She appears well-developed and well-nourished.  HENT:  Head: Normocephalic.  Right Ear: External ear normal.  Left Ear: External ear normal.  Mouth/Throat: Oropharynx is clear and moist.  Eyes: Conjunctivae and EOM are normal. Pupils are equal, round, and reactive to light.  Neck: Normal range of motion. Neck supple. No thyromegaly present.  Cardiovascular: Normal rate, regular rhythm, normal heart sounds and intact distal pulses.  Pulmonary/Chest: Effort normal and breath sounds normal.  Abdominal: Soft. Bowel sounds are normal. She exhibits no mass. There is no tenderness.  Musculoskeletal: Normal range of motion.  Surgical scar left knee healing nicely  Lymphadenopathy:    She has no cervical adenopathy.  Neurological: She is alert and oriented to person, place, and time.  Skin: Skin is warm and dry. No rash noted.  Psychiatric: She has a normal mood and affect. Her behavior is normal.          Assessment & Plan:   Status post left total knee replacement surgery Essential hypertension controlled History of postop I abnormalities.  Will recheck electrolytes Hypothyroidism.  Review TSH  Nyoka Cowden

## 2017-10-24 NOTE — Patient Instructions (Signed)
Limit your sodium (Salt) intake  Please check your blood pressure on a regular basis.  If it is consistently greater than 150/90, please make an office appointment.  Return in 6 months for follow-up   

## 2017-10-25 ENCOUNTER — Other Ambulatory Visit: Payer: Self-pay | Admitting: Internal Medicine

## 2017-10-25 LAB — BASIC METABOLIC PANEL
BUN: 13 mg/dL (ref 6–23)
CHLORIDE: 103 meq/L (ref 96–112)
CO2: 26 mEq/L (ref 19–32)
CREATININE: 0.71 mg/dL (ref 0.40–1.20)
Calcium: 9.2 mg/dL (ref 8.4–10.5)
GFR: 88.89 mL/min (ref >60.00)
Glucose, Bld: 89 mg/dL (ref 70–99)
Potassium: 4.3 mEq/L (ref 3.5–5.1)
Sodium: 137 mEq/L (ref 135–145)

## 2017-10-25 LAB — TSH: TSH: 11.32 u[IU]/mL — ABNORMAL HIGH (ref 0.35–4.50)

## 2017-10-25 LAB — MAGNESIUM: MAGNESIUM: 2.1 mg/dL (ref 1.5–2.5)

## 2017-10-25 MED ORDER — LEVOTHYROXINE SODIUM 175 MCG PO TABS: 175.0000 ug | ORAL_TABLET | Freq: Every day | ORAL | 1 refills | Status: DC

## 2017-11-01 DIAGNOSIS — N201 Calculus of ureter: Secondary | ICD-10-CM | POA: Diagnosis not present

## 2017-11-01 DIAGNOSIS — N3281 Overactive bladder: Secondary | ICD-10-CM | POA: Diagnosis not present

## 2017-11-01 DIAGNOSIS — R1084 Generalized abdominal pain: Secondary | ICD-10-CM | POA: Diagnosis not present

## 2017-11-30 ENCOUNTER — Other Ambulatory Visit: Payer: Self-pay | Admitting: Internal Medicine

## 2017-12-02 ENCOUNTER — Encounter: Payer: Self-pay | Admitting: Internal Medicine

## 2017-12-02 ENCOUNTER — Ambulatory Visit: Payer: 59 | Admitting: Internal Medicine

## 2017-12-02 VITALS — BP 120/62 | HR 59 | Temp 98.5°F | Ht 67.0 in | Wt 174.0 lb

## 2017-12-02 DIAGNOSIS — I10 Essential (primary) hypertension: Secondary | ICD-10-CM | POA: Diagnosis not present

## 2017-12-02 DIAGNOSIS — E039 Hypothyroidism, unspecified: Secondary | ICD-10-CM | POA: Diagnosis not present

## 2017-12-02 LAB — TSH: TSH: 0.22 u[IU]/mL — AB (ref 0.35–4.50)

## 2017-12-02 MED ORDER — ALBUTEROL SULFATE HFA 108 (90 BASE) MCG/ACT IN AERS: 2.0000 | INHALATION_SPRAY | Freq: Four times a day (QID) | RESPIRATORY_TRACT | 0 refills | Status: DC | PRN

## 2017-12-02 NOTE — Progress Notes (Signed)
Subjective:    Patient ID: Katelyn [name], female    DOB: [birth year], 62 y.o.   MRN: [id number]  HPI 62 year old patient who is seen today for follow-up of hypothyroidism. She has required up titration of her levothyroxine and now is on 175 mcg daily.  She does take this properly first thing in the morning and omits p.o. intake for at least 30 minutes She generally feels well today although still having some pain and swelling involving her left knee.  Past Medical History:  Diagnosis Date  . Anemia   . Headache(784.0)    cluster headaches, migraines- states is taking Atenolol at present as directed per Dr Lacinda Axon  to prevent migraine from increased stress-  states if on medication for several weeks that heart rate will drop to 40's  . History of kidney stones   . HYPOTHYROIDISM 05/27/2007  . Pneumonia    post op x 2 last yrs ago  . PONV (postoperative nausea and vomiting)    likes phenergan with anesthesia, has woken up with 2 surgeries per patient   . WEIGHT GAIN 07/15/2009     Social History   Socioeconomic History  . Marital status: Married    Spouse name: Not on file  . Number of children: Not on file  . Years of education: Not on file  . Highest education level: Not on file  Social Needs  . Financial resource strain: Not on file  . Food insecurity - worry: Not on file  . Food insecurity - inability: Not on file  . Transportation needs - medical: Not on file  . Transportation needs - non-medical: Not on file  Occupational History  . Not on file  Tobacco Use  . Smoking status: Never Smoker  . Smokeless tobacco: Never Used  Substance and Sexual Activity  . Alcohol use: No  . Drug use: No  . Sexual activity: Yes  Other Topics Concern  . Not on file  Social History Narrative  . Not on file    Past Surgical History:  Procedure Laterality Date  . ABDOMINAL HYSTERECTOMY    . APPENDECTOMY    . CYSTOSCOPY WITH RETROGRADE PYELOGRAM, URETEROSCOPY AND STENT  PLACEMENT Right 04/26/2017   Procedure: CYSTOSCOPY WITH RETROGRADE PYELOGRAM, URETEROSCOPY AND STENT PLACEMENT;  Surgeon: Cleon Gustin, MD;  Location: [facility];  Service: Urology;  Laterality: Right;  . CYSTOSCOPY WITH RETROGRADE PYELOGRAM, URETEROSCOPY AND STENT PLACEMENT Right 08/19/2017   Procedure: CYSTOSCOPY WITH RETROGRADE PYELOGRAM, URETEROSCOPY;  Surgeon: Cleon Gustin, MD;  Location: [facility];  Service: Urology;  Laterality: Right;  . DIAGNOSTIC LAPAROSCOPY    . FOOT SURGERY  2007   tumor removed from right foot/   . HOLMIUM LASER APPLICATION Right 8/52/7782   Procedure: HOLMIUM LASER APPLICATION;  Surgeon: Cleon Gustin, MD;  Location: [facility];  Service: Urology;  Laterality: Right;  . KNEE ARTHROSCOPY Left    x 3  . LAPAROTOMY  10/30/2011   Procedure: EXPLORATORY LAPAROTOMY;  Surgeon: Janie Morning, MD;  Location: [facility];  Service: Gynecology;  Laterality: N/A;  . mortons neuroma removed from foot    . SALPINGOOPHORECTOMY  10/30/2011   Procedure: SALPINGO OOPHERECTOMY;  Surgeon: Janie Morning, MD;  Location: [facility];  Service: Gynecology;  Laterality: Right;  RIGHT  . TONSILLECTOMY    . TOTAL KNEE ARTHROPLASTY Left 09/25/2017   Procedure: LEFT TOTAL KNEE ARTHROPLASTY;  Surgeon: Latanya Maudlin, MD;  Location: [facility];  Service: Orthopedics;  Laterality: Left;  .  TUBAL LIGATION    . TYMPANOSTOMY TUBE PLACEMENT      Family History  Problem Relation Age of Onset  . Alzheimer's disease Mother   . Cervical cancer Mother   . Diabetes Sister   . Heart attack Brother   . Hypertension Neg Hx        family hx  . Heart disease Neg Hx        family hx    Allergies  Allergen Reactions  . Adhesive [Tape] Swelling and Other (See Comments)    Reaction:  Eats in skin  . Ciprofloxacin Hives and Shortness Of Breath  . Dimethicone Hives and Shortness Of Breath  . Erythromycin Hives and Shortness Of Breath  . Latex Hives and Shortness Of Breath  .  Other Hives, Shortness Of Breath and Other (See Comments)    Pt is allergic to nitrile gloves.  Pt is allergic to antibacterial soaps- rash - states she only uses Dove soap  SCDs- patient states - had itching with this   . Penicillins Hives, Shortness Of Breath and Other (See Comments)    Has patient had a PCN reaction causing immediate rash, facial/tongue/throat swelling, SOB or lightheadedness with hypotension: Yes Has patient had a PCN reaction causing severe rash involving mucus membranes or skin necrosis: No Has patient had a PCN reaction that required hospitalization No Has patient had a PCN reaction occurring within the last 10 years: No If all of the above answers are "NO", then may proceed with Cephalosporin use.  Marland Kitchen Pineapple Anaphylaxis  . Red Dye Hives and Shortness Of Breath  . Shellfish Allergy Hives and Shortness Of Breath  . Sulfonamide Derivatives Hives and Shortness Of Breath  . Alcohol-Sulfur [Sulfur] Swelling and Other (See Comments)    Reaction:  Localized swelling  With alcohol   . Aspirin Nausea And Vomiting  . Doxycycline Nausea And Vomiting  . Hibiclens [Chlorhexidine]     Rash   . Ceftin [Cefuroxime] Rash    Current Outpatient Medications on File Prior to Visit  Medication Sig Dispense Refill  . albuterol (PROVENTIL HFA;VENTOLIN HFA) 108 (90 Base) MCG/ACT inhaler Inhale 2 puffs into the lungs every 6 (six) hours as needed for wheezing or shortness of breath. 1 Inhaler 0  . aspirin EC 325 MG tablet Take 1 tablet (325 mg total) 2 (two) times daily by mouth. 30 tablet 0  . atenolol (TENORMIN) 25 MG tablet TAKE 1 TABLET (25 MG TOTAL) BY MOUTH EVERY MORNING. FOR MIGRAINES (Patient taking differently: Take 25 mg by mouth daily as needed. For migraines) 90 tablet 1  . Biotin 1 MG CAPS Take 1 mg by mouth daily.    . cholecalciferol (VITAMIN D) 1000 UNITS tablet Take 1,000 Units by mouth daily.     . Cranberry 1000 MG CAPS Take 1,000 mg by mouth daily.    .  Cyanocobalamin (VITAMIN B-12) 2500 MCG SUBL Place 2,500 mcg under the tongue daily.     . cyclobenzaprine (FLEXERIL) 10 MG tablet Take 1 tablet (10 mg total) every 8 (eight) hours as needed by mouth for muscle spasms. 60 tablet 0  . levothyroxine (SYNTHROID, LEVOTHROID) 175 MCG tablet Take 1 tablet (175 mcg total) by mouth daily before breakfast. 90 tablet 1  . mirabegron ER (MYRBETRIQ) 25 MG TB24 tablet Take 25 mg by mouth daily.    . ondansetron (ZOFRAN ODT) 4 MG disintegrating tablet Take 1 tablet (4 mg total) by mouth every 8 (eight) hours as needed for nausea or vomiting.  30 tablet 0  . oxyCODONE (OXY IR/ROXICODONE) 5 MG immediate release tablet Take 1-3 tablets (5-15 mg total) every 3 (three) hours as needed by mouth for breakthrough pain. 80 tablet 0  . phentermine 30 MG capsule TAKE ONE CAPSULE BY MOUTH DAILY 90 capsule 0  . promethazine (PHENERGAN) 25 MG tablet Take 1 tablet (25 mg total) every 8 (eight) hours as needed by mouth for refractory nausea / vomiting (if response to Zofran not sufficient). 30 tablet 0  . tamsulosin (FLOMAX) 0.4 MG CAPS capsule Take 0.4 mg by mouth daily.     No current facility-administered medications on file prior to visit.     BP 120/62 (BP Location: Left Arm, Patient Position: Sitting, Cuff Size: Normal)   Pulse (!) 59   Temp 98.5 F (36.9 C) (Oral)   Ht 5\' 7"  (1.702 m)   Wt 174 lb (78.9 kg)   SpO2 96%   BMI 27.25 kg/m      Review of Systems  Constitutional: Negative.   HENT: Negative for congestion, dental problem, hearing loss, rhinorrhea, sinus pressure, sore throat and tinnitus.   Eyes: Negative for pain, discharge and visual disturbance.  Respiratory: Negative for cough and shortness of breath.   Cardiovascular: Negative for chest pain, palpitations and leg swelling.  Gastrointestinal: Negative for abdominal distention, abdominal pain, blood in stool, constipation, diarrhea, nausea and vomiting.  Genitourinary: Negative for difficulty  urinating, dysuria, flank pain, frequency, hematuria, pelvic pain, urgency, vaginal bleeding, vaginal discharge and vaginal pain.  Musculoskeletal: Positive for arthralgias and gait problem. Negative for joint swelling.  Skin: Negative for rash.  Neurological: Negative for dizziness, syncope, speech difficulty, weakness, numbness and headaches.  Hematological: Negative for adenopathy.  Psychiatric/Behavioral: Negative for agitation, behavioral problems and dysphoric mood. The patient is not nervous/anxious.        Objective:   Physical Exam  Constitutional: She appears well-developed and well-nourished. No distress.  Musculoskeletal:  Nicely healing surgical scar left knee          Assessment & Plan:   Hypothyroidism.  Will review a TSH.  Patient has been taking her medication properly and has been 100% compliant Essential hypertension stable  Follow-up 6 months  Ondine Gemme Pilar Plate

## 2017-12-02 NOTE — Patient Instructions (Signed)
Limit your sodium (Salt) intake  Please check your blood pressure on a regular basis.  If it is consistently greater than 150/90, please make an office appointment.  Return in 6 months for follow-up   

## 2018-01-09 DIAGNOSIS — M1712 Unilateral primary osteoarthritis, left knee: Secondary | ICD-10-CM | POA: Diagnosis not present

## 2018-02-25 ENCOUNTER — Ambulatory Visit (INDEPENDENT_AMBULATORY_CARE_PROVIDER_SITE_OTHER): Payer: 59 | Admitting: Internal Medicine

## 2018-02-25 ENCOUNTER — Encounter: Payer: Self-pay | Admitting: Internal Medicine

## 2018-02-25 VITALS — BP 130/80 | HR 84 | Temp 98.4°F | Wt 172.8 lb

## 2018-02-25 DIAGNOSIS — E039 Hypothyroidism, unspecified: Secondary | ICD-10-CM | POA: Diagnosis not present

## 2018-02-25 DIAGNOSIS — Z96652 Presence of left artificial knee joint: Secondary | ICD-10-CM | POA: Diagnosis not present

## 2018-02-25 DIAGNOSIS — I10 Essential (primary) hypertension: Secondary | ICD-10-CM | POA: Diagnosis not present

## 2018-02-25 MED ORDER — PHENTERMINE HCL 30 MG PO CAPS: 30.0000 mg | ORAL_CAPSULE | Freq: Every day | ORAL | 0 refills | Status: DC

## 2018-02-25 MED ORDER — ATENOLOL 25 MG PO TABS: 25.0000 mg | ORAL_TABLET | ORAL | 4 refills | Status: DC

## 2018-02-25 NOTE — Progress Notes (Signed)
Subjective:    Patient ID: Katelyn [name], female    DOB: Apr 08, 1956, 62 y.o.   MRN: [id number]  HPI  62 year old patient who is seen today in follow-up.  She has hypothyroidism and recently levothyroxine was increased to 175 mcg daily from a prior dose of 150 mg daily due to an elevated TSH.  She had a follow-up TSH performed that was slightly suppressed and presently she is on a dose of 175 mcg 6 days a week only.  I calculated the total weekly dose which is 1050 mcg-this dose is the same whether the patient has taken 150 mcg 7 times per week or 175 mcg only 6 days/week.  She continues to have left knee pain following left total knee replacement surgery.  Past Medical History:  Diagnosis Date  . Anemia   . Headache(784.0)    cluster headaches, migraines- states is taking Atenolol at present as directed per Dr Lacinda Axon  to prevent migraine from increased stress-  states if on medication for several weeks that heart rate will drop to 40's  . History of kidney stones   . HYPOTHYROIDISM 05/27/2007  . Pneumonia    post op x 2 last yrs ago  . PONV (postoperative nausea and vomiting)    likes phenergan with anesthesia, has woken up with 2 surgeries per patient   . WEIGHT GAIN 07/15/2009     Social History   Socioeconomic History  . Marital status: Married    Spouse name: Not on file  . Number of children: Not on file  . Years of education: Not on file  . Highest education level: Not on file  Occupational History  . Not on file  Social Needs  . Financial resource strain: Not on file  . Food insecurity:    Worry: Not on file    Inability: Not on file  . Transportation needs:    Medical: Not on file    Non-medical: Not on file  Tobacco Use  . Smoking status: Never Smoker  . Smokeless tobacco: Never Used  Substance and Sexual Activity  . Alcohol use: No  . Drug use: No  . Sexual activity: Yes  Lifestyle  . Physical activity:    Days per week: Not on file    Minutes  per session: Not on file  . Stress: Not on file  Relationships  . Social connections:    Talks on phone: Not on file    Gets together: Not on file    Attends religious service: Not on file    Active member of club or organization: Not on file    Attends meetings of clubs or organizations: Not on file    Relationship status: Not on file  . Intimate partner violence:    Fear of current or ex partner: Not on file    Emotionally abused: Not on file    Physically abused: Not on file    Forced sexual activity: Not on file  Other Topics Concern  . Not on file  Social History Narrative  . Not on file    Past Surgical History:  Procedure Laterality Date  . ABDOMINAL HYSTERECTOMY    . APPENDECTOMY    . CYSTOSCOPY WITH RETROGRADE PYELOGRAM, URETEROSCOPY AND STENT PLACEMENT Right 04/26/2017   Procedure: CYSTOSCOPY WITH RETROGRADE PYELOGRAM, URETEROSCOPY AND STENT PLACEMENT;  Surgeon: Cleon Gustin, MD;  Location: [facility];  Service: Urology;  Laterality: Right;  . CYSTOSCOPY WITH RETROGRADE PYELOGRAM, URETEROSCOPY AND STENT PLACEMENT Right 08/19/2017  Procedure: CYSTOSCOPY WITH RETROGRADE PYELOGRAM, URETEROSCOPY;  Surgeon: Cleon Gustin, MD;  Location: [facility];  Service: Urology;  Laterality: Right;  . DIAGNOSTIC LAPAROSCOPY    . FOOT SURGERY  2007   tumor removed from right foot/   . HOLMIUM LASER APPLICATION Right 7/86/7672   Procedure: HOLMIUM LASER APPLICATION;  Surgeon: Cleon Gustin, MD;  Location: [facility];  Service: Urology;  Laterality: Right;  . KNEE ARTHROSCOPY Left    x 3  . LAPAROTOMY  10/30/2011   Procedure: EXPLORATORY LAPAROTOMY;  Surgeon: Janie Morning, MD;  Location: [facility];  Service: Gynecology;  Laterality: N/A;  . mortons neuroma removed from foot    . SALPINGOOPHORECTOMY  10/30/2011   Procedure: SALPINGO OOPHERECTOMY;  Surgeon: Janie Morning, MD;  Location: [facility];  Service: Gynecology;  Laterality: Right;  RIGHT  . TONSILLECTOMY      . TOTAL KNEE ARTHROPLASTY Left 09/25/2017   Procedure: LEFT TOTAL KNEE ARTHROPLASTY;  Surgeon: Latanya Maudlin, MD;  Location: [facility];  Service: Orthopedics;  Laterality: Left;  . TUBAL LIGATION    . TYMPANOSTOMY TUBE PLACEMENT      Family History  Problem Relation Age of Onset  . Alzheimer's disease Mother   . Cervical cancer Mother   . Diabetes Sister   . Heart attack Brother   . Hypertension Neg Hx        family hx  . Heart disease Neg Hx        family hx    Allergies  Allergen Reactions  . Adhesive [Tape] Swelling and Other (See Comments)    Reaction:  Eats in skin  . Ciprofloxacin Hives and Shortness Of Breath  . Dimethicone Hives and Shortness Of Breath  . Erythromycin Hives and Shortness Of Breath  . Latex Hives and Shortness Of Breath  . Other Hives, Shortness Of Breath and Other (See Comments)    Pt is allergic to nitrile gloves.  Pt is allergic to antibacterial soaps- rash - states she only uses Dove soap  SCDs- patient states - had itching with this   . Penicillins Hives, Shortness Of Breath and Other (See Comments)    Has patient had a PCN reaction causing immediate rash, facial/tongue/throat swelling, SOB or lightheadedness with hypotension: Yes Has patient had a PCN reaction causing severe rash involving mucus membranes or skin necrosis: No Has patient had a PCN reaction that required hospitalization No Has patient had a PCN reaction occurring within the last 10 years: No If all of the above answers are "NO", then may proceed with Cephalosporin use.  Marland Kitchen Pineapple Anaphylaxis  . Red Dye Hives and Shortness Of Breath  . Shellfish Allergy Hives and Shortness Of Breath  . Sulfonamide Derivatives Hives and Shortness Of Breath  . Alcohol-Sulfur [Sulfur] Swelling and Other (See Comments)    Reaction:  Localized swelling  With alcohol   . Aspirin Nausea And Vomiting  . Doxycycline Nausea And Vomiting  . Hibiclens [Chlorhexidine]     Rash   . Ceftin [Cefuroxime]  Rash    Current Outpatient Medications on File Prior to Visit  Medication Sig Dispense Refill  . albuterol (PROVENTIL HFA;VENTOLIN HFA) 108 (90 Base) MCG/ACT inhaler Inhale 2 puffs into the lungs every 6 (six) hours as needed for wheezing or shortness of breath. 1 Inhaler 0  . aspirin EC 325 MG tablet Take 1 tablet (325 mg total) 2 (two) times daily by mouth. 30 tablet 0  . Biotin 1 MG CAPS Take 1 mg by  mouth daily.    . cholecalciferol (VITAMIN D) 1000 UNITS tablet Take 1,000 Units by mouth daily.     . Cranberry 1000 MG CAPS Take 1,000 mg by mouth daily.    . Cyanocobalamin (VITAMIN B-12) 2500 MCG SUBL Place 2,500 mcg under the tongue daily.     Marland Kitchen levothyroxine (SYNTHROID, LEVOTHROID) 175 MCG tablet Take 1 tablet (175 mcg total) by mouth daily before breakfast. 90 tablet 1  . mirabegron ER (MYRBETRIQ) 25 MG TB24 tablet Take 25 mg by mouth daily.    . ondansetron (ZOFRAN ODT) 4 MG disintegrating tablet Take 1 tablet (4 mg total) by mouth every 8 (eight) hours as needed for nausea or vomiting. 30 tablet 0  . promethazine (PHENERGAN) 25 MG tablet Take 1 tablet (25 mg total) every 8 (eight) hours as needed by mouth for refractory nausea / vomiting (if response to Zofran not sufficient). 30 tablet 0  . tamsulosin (FLOMAX) 0.4 MG CAPS capsule Take 0.4 mg by mouth daily.     No current facility-administered medications on file prior to visit.     BP 130/80 (BP Location: Left Arm, Patient Position: Sitting, Cuff Size: Large)   Pulse 84   Temp 98.4 F (36.9 C) (Oral)   Wt 172 lb 12.8 oz (78.4 kg)   SpO2 94%   BMI 27.06 kg/m     Review of Systems  Constitutional: Negative.   HENT: Negative for congestion, dental problem, hearing loss, rhinorrhea, sinus pressure, sore throat and tinnitus.   Eyes: Negative for pain, discharge and visual disturbance.  Respiratory: Negative for cough and shortness of breath.   Cardiovascular: Negative for chest pain, palpitations and leg swelling.    Gastrointestinal: Negative for abdominal distention, abdominal pain, blood in stool, constipation, diarrhea, nausea and vomiting.  Genitourinary: Negative for difficulty urinating, dysuria, flank pain, frequency, hematuria, pelvic pain, urgency, vaginal bleeding, vaginal discharge and vaginal pain.  Musculoskeletal: Positive for arthralgias and gait problem. Negative for joint swelling.  Skin: Negative for rash.  Neurological: Negative for dizziness, syncope, speech difficulty, weakness, numbness and headaches.  Hematological: Negative for adenopathy.  Psychiatric/Behavioral: Negative for agitation, behavioral problems and dysphoric mood. The patient is not nervous/anxious.        Objective:   Physical Exam  Constitutional: She appears well-developed. No distress.  Musculoskeletal:  Status post left total knee replacement surgery Nicely healing surgical scar          Assessment & Plan:   Hypothyroidism.  Will review a TSH.  This likely will be slightly elevated since she inadvertently remains on the same weekly dose as when she took 150 mcg 7 times per week. Status post left total knee replacement surgery  Will adjust thyroid dosing if needed Follow-up 4-6  months  Benjaman Artman Pilar Plate

## 2018-02-25 NOTE — Patient Instructions (Signed)
Limit your sodium (Salt) intake  Please check your blood pressure on a regular basis.  If it is consistently greater than 150/90, please make an office appointment.     It is important that you exercise regularly, at least 20 minutes 3 to 4 times per week.  If you develop chest pain or shortness of breath seek  medical attention. 

## 2018-02-26 ENCOUNTER — Telehealth: Payer: Self-pay | Admitting: Internal Medicine

## 2018-02-26 LAB — TSH: TSH: 0.42 u[IU]/mL (ref 0.35–4.50)

## 2018-02-26 NOTE — Progress Notes (Signed)
Labs uploaded to Patient's mychart.

## 2018-02-26 NOTE — Telephone Encounter (Signed)
Copied from [place] [id number]. Topic: Quick Communication - See Telephone Encounter >> Feb 26, 2018  2:10 PM Bea Graff, NT wrote: CRM for notification. See Telephone encounter for: 02/26/18. Pt would like a call with her lab results. She states if unable to answer may leave a detailed voicemail.

## 2018-04-01 DIAGNOSIS — M1712 Unilateral primary osteoarthritis, left knee: Secondary | ICD-10-CM | POA: Diagnosis not present

## 2018-04-01 DIAGNOSIS — Z96652 Presence of left artificial knee joint: Secondary | ICD-10-CM | POA: Diagnosis not present

## 2018-04-01 DIAGNOSIS — Z471 Aftercare following joint replacement surgery: Secondary | ICD-10-CM | POA: Diagnosis not present

## 2018-04-15 DIAGNOSIS — M1712 Unilateral primary osteoarthritis, left knee: Secondary | ICD-10-CM | POA: Diagnosis not present

## 2018-05-09 DIAGNOSIS — N3281 Overactive bladder: Secondary | ICD-10-CM | POA: Diagnosis not present

## 2018-05-09 DIAGNOSIS — N201 Calculus of ureter: Secondary | ICD-10-CM | POA: Diagnosis not present

## 2018-05-28 DIAGNOSIS — Z1231 Encounter for screening mammogram for malignant neoplasm of breast: Secondary | ICD-10-CM | POA: Diagnosis not present

## 2018-05-28 DIAGNOSIS — Z01419 Encounter for gynecological examination (general) (routine) without abnormal findings: Secondary | ICD-10-CM | POA: Diagnosis not present

## 2018-06-03 DIAGNOSIS — M25561 Pain in right knee: Secondary | ICD-10-CM | POA: Diagnosis not present

## 2018-06-18 ENCOUNTER — Encounter: Payer: Self-pay | Admitting: Internal Medicine

## 2018-06-18 ENCOUNTER — Ambulatory Visit: Payer: 59 | Admitting: Internal Medicine

## 2018-06-18 VITALS — BP 120/70 | HR 73 | Temp 98.7°F | Ht 65.75 in | Wt 186.6 lb

## 2018-06-18 DIAGNOSIS — K760 Fatty (change of) liver, not elsewhere classified: Secondary | ICD-10-CM

## 2018-06-18 DIAGNOSIS — E039 Hypothyroidism, unspecified: Secondary | ICD-10-CM | POA: Diagnosis not present

## 2018-06-18 DIAGNOSIS — I10 Essential (primary) hypertension: Secondary | ICD-10-CM | POA: Diagnosis not present

## 2018-06-18 MED ORDER — LEVOTHYROXINE SODIUM 175 MCG PO TABS: 175.0000 ug | ORAL_TABLET | Freq: Every day | ORAL | 4 refills | Status: DC

## 2018-06-18 NOTE — Patient Instructions (Signed)
Hydrate and Humidify  Drink enough water to keep your urine clear or pale yellow. Staying hydrated will help to thin your mucus.  Use a cool mist humidifier to keep the humidity level in your home above 50%.  Inhale steam for 10-15 minutes, 3-4 times a day or as told by your health care provider. You can do this in the bathroom while a hot shower is running.  Limit your exposure to cool or dry air. Rest  Rest as much as possible.  Sleep with your head raised (elevated).

## 2018-06-18 NOTE — Progress Notes (Signed)
   Subjective:    Patient ID: Katelyn [name], female    DOB: 10/12/1956, 62 y.o.   MRN: [id number]  HPI  62 year old patient who has a history of essential hypertension..  She is an employee of [employer] and requires annual screening lab.  Lab studies will be obtained and forms completed and faxed Complaints include a 2-week history of some sinus congestion nocturnal cough and headaches She states that she has had a very difficult time obtaining Synthroid and has been off for approximately 2 weeks.  This medication was refilled and a 30-day supply printed and given to the patient. She denies any wheezing or obvious rhinorrhea.  Cough is only present through the night. Imaging studies in November included a head CT scan as well as a chest CTA  Review of Systems  Constitutional: Negative.   HENT: Negative for congestion, dental problem, hearing loss, rhinorrhea, sinus pressure, sore throat and tinnitus.   Eyes: Negative for pain, discharge and visual disturbance.  Respiratory: Positive for cough. Negative for shortness of breath.   Cardiovascular: Negative for chest pain, palpitations and leg swelling.  Gastrointestinal: Negative for abdominal distention, abdominal pain, blood in stool, constipation, diarrhea, nausea and vomiting.  Genitourinary: Negative for difficulty urinating, dysuria, flank pain, frequency, hematuria, pelvic pain, urgency, vaginal bleeding, vaginal discharge and vaginal pain.  Musculoskeletal: Negative for arthralgias, gait problem and joint swelling.  Skin: Negative for rash.  Neurological: Positive for headaches. Negative for dizziness, syncope, speech difficulty, weakness and numbness.  Hematological: Negative for adenopathy.  Psychiatric/Behavioral: Negative for agitation, behavioral problems and dysphoric mood. The patient is not nervous/anxious.        Objective:   Physical Exam  Constitutional: She is oriented to person, place, and time. She appears  well-developed and well-nourished.  HENT:  Head: Normocephalic.  Right Ear: External ear normal.  Left Ear: External ear normal.  Mouth/Throat: Oropharynx is clear and moist.  Eyes: Pupils are equal, round, and reactive to light. Conjunctivae and EOM are normal.  Neck: Normal range of motion. Neck supple. No thyromegaly present.  Cardiovascular: Normal rate, regular rhythm, normal heart sounds and intact distal pulses.  Pulmonary/Chest: Effort normal and breath sounds normal.  Abdominal: Soft. Bowel sounds are normal. She exhibits no mass. There is no tenderness.  Musculoskeletal: Normal range of motion.  Lymphadenopathy:    She has no cervical adenopathy.  Neurological: She is alert and oriented to person, place, and time.  Skin: Skin is warm and dry. No rash noted.  Psychiatric: She has a normal mood and affect. Her behavior is normal.          Assessment & Plan:  Hypertension stable  Nocturnal cough.  Will give a trial of Zyrtec and fluticasone nasal spray Health maintenance.  Will check required lab including immunization status for measles mumps and rubella  Patient will follow-up with new provider in 3 to 4 months or as needed  Marletta Lor

## 2018-06-20 LAB — LP+CREAT+HB A1C
CHOLESTEROL TOTAL: 222 mg/dL — AB (ref 100–199)
Chol/HDL Ratio: 5.7 ratio — ABNORMAL HIGH (ref 0.0–4.4)
Creatinine, Ser: 0.9 mg/dL (ref 0.57–1.00)
GFR calc Af Amer: 80 mL/min/{1.73_m2} (ref >59)
GFR calc non Af Amer: 69 mL/min/{1.73_m2} (ref >59)
HDL: 39 mg/dL — ABNORMAL LOW (ref >39)
LDL CALC: 148 mg/dL — AB (ref 0–99)
TRIGLYCERIDES: 176 mg/dL — AB (ref 0–149)
VLDL CHOLESTEROL CAL: 35 mg/dL (ref 5–40)

## 2018-06-20 LAB — GLUCOSE, RANDOM: Glucose: 80 mg/dL (ref 65–99)

## 2018-06-20 LAB — MEASLES/MUMPS/RUBELLA IMMUNITY
MUMPS ABS, IGG: 31.5 AU/mL (ref >10.9)
RUBEOLA AB, IGG: >300 AU/mL (ref >29.9)
Rubella Antibodies, IGG: 11.8 index (ref >0.99)

## 2018-06-24 ENCOUNTER — Telehealth: Payer: Self-pay

## 2018-06-24 ENCOUNTER — Other Ambulatory Visit: Payer: Self-pay

## 2018-06-24 DIAGNOSIS — I10 Essential (primary) hypertension: Secondary | ICD-10-CM | POA: Diagnosis not present

## 2018-06-24 LAB — HEMOGLOBIN A1C
ESTIMATED AVERAGE GLUCOSE: 111 mg/dL
Hgb A1c MFr Bld: 5.5 % (ref 4.8–5.6)

## 2018-06-24 MED ORDER — LEVOTHYROXINE SODIUM 175 MCG PO TABS: 175.0000 ug | ORAL_TABLET | Freq: Every day | ORAL | 0 refills | Status: DC

## 2018-06-24 NOTE — Telephone Encounter (Signed)
Calling pt to see if she wants to come in to the office to have her lab work done for A1c.

## 2018-06-24 NOTE — Addendum Note (Signed)
Addended by: Rene Kocher on: 06/24/2018 03:03 PM   Modules accepted: Orders

## 2018-07-01 DIAGNOSIS — M1712 Unilateral primary osteoarthritis, left knee: Secondary | ICD-10-CM | POA: Diagnosis not present

## 2018-07-17 ENCOUNTER — Other Ambulatory Visit: Payer: Self-pay | Admitting: Internal Medicine

## 2018-07-17 NOTE — Telephone Encounter (Signed)
Informed pt that Dr.Kwiakowski is out of the office via vm. Rx will be refilled Monday if appropriate.

## 2018-08-04 ENCOUNTER — Ambulatory Visit: Payer: 59 | Admitting: Internal Medicine

## 2018-08-29 ENCOUNTER — Other Ambulatory Visit: Payer: Self-pay | Admitting: Internal Medicine

## 2018-09-13 DIAGNOSIS — M25562 Pain in left knee: Secondary | ICD-10-CM | POA: Diagnosis not present

## 2018-10-14 ENCOUNTER — Encounter: Payer: Self-pay | Admitting: Internal Medicine

## 2018-10-14 ENCOUNTER — Ambulatory Visit: Payer: 59 | Admitting: Internal Medicine

## 2018-10-14 VITALS — BP 130/80 | Temp 98.3°F | Wt 178.4 lb

## 2018-10-14 DIAGNOSIS — Z96652 Presence of left artificial knee joint: Secondary | ICD-10-CM

## 2018-10-14 DIAGNOSIS — N200 Calculus of kidney: Secondary | ICD-10-CM

## 2018-10-14 DIAGNOSIS — R635 Abnormal weight gain: Secondary | ICD-10-CM | POA: Diagnosis not present

## 2018-10-14 DIAGNOSIS — E039 Hypothyroidism, unspecified: Secondary | ICD-10-CM | POA: Diagnosis not present

## 2018-10-14 DIAGNOSIS — I10 Essential (primary) hypertension: Secondary | ICD-10-CM

## 2018-10-14 NOTE — Patient Instructions (Addendum)
-  It was nice meeting you!  -Please schedule follow-up with me in 3 to 4 months for your annual physical.  Come fasting to that visit.

## 2018-10-14 NOTE — Progress Notes (Signed)
Established Patient Office Visit     CC/Reason for Visit: Establish care, chronic medical follow-up  HPI: Katelyn [name] is a 62 y.o. female who is coming in today for the above mentioned reasons.  Due for annual physical exam in March 2020.  Past Medical History is significant for: Hypertension, hypothyroidism, cluster headaches, migraine headaches, history of nephrolithiasis followed by Dr. Alyson Ingles with urology, left total knee replacement in November 2018 followed by Dr. Gladstone Lighter.  She has no acute complaints today, is here to establish care.  Her hypertension has been stable, hypothyroidism is also stable on 175 mcg of Synthroid 6 days a week.  Her headaches have been well controlled.   Past Medical/Surgical History: Past Medical History:  Diagnosis Date  . Anemia   . Headache(784.0)    cluster headaches, migraines- states is taking Atenolol at present as directed per Dr Lacinda Axon  to prevent migraine from increased stress-  states if on medication for several weeks that heart rate will drop to 40's  . History of kidney stones   . HYPOTHYROIDISM 05/27/2007  . Pneumonia    post op x 2 last yrs ago  . PONV (postoperative nausea and vomiting)    likes phenergan with anesthesia, has woken up with 2 surgeries per patient   . WEIGHT GAIN 07/15/2009    Past Surgical History:  Procedure Laterality Date  . ABDOMINAL HYSTERECTOMY    . APPENDECTOMY    . CYSTOSCOPY WITH RETROGRADE PYELOGRAM, URETEROSCOPY AND STENT PLACEMENT Right 04/26/2017   Procedure: CYSTOSCOPY WITH RETROGRADE PYELOGRAM, URETEROSCOPY AND STENT PLACEMENT;  Surgeon: Cleon Gustin, MD;  Location: [facility];  Service: Urology;  Laterality: Right;  . CYSTOSCOPY WITH RETROGRADE PYELOGRAM, URETEROSCOPY AND STENT PLACEMENT Right 08/19/2017   Procedure: CYSTOSCOPY WITH RETROGRADE PYELOGRAM, URETEROSCOPY;  Surgeon: Cleon Gustin, MD;  Location: [facility];  Service: Urology;  Laterality: Right;  .  DIAGNOSTIC LAPAROSCOPY    . FOOT SURGERY  2007   tumor removed from right foot/   . HOLMIUM LASER APPLICATION Right 5/80/9983   Procedure: HOLMIUM LASER APPLICATION;  Surgeon: Cleon Gustin, MD;  Location: [facility];  Service: Urology;  Laterality: Right;  . KNEE ARTHROSCOPY Left    x 3  . LAPAROTOMY  10/30/2011   Procedure: EXPLORATORY LAPAROTOMY;  Surgeon: Janie Morning, MD;  Location: [facility];  Service: Gynecology;  Laterality: N/A;  . mortons neuroma removed from foot    . SALPINGOOPHORECTOMY  10/30/2011   Procedure: SALPINGO OOPHERECTOMY;  Surgeon: Janie Morning, MD;  Location: [facility];  Service: Gynecology;  Laterality: Right;  RIGHT  . TONSILLECTOMY    . TOTAL KNEE ARTHROPLASTY Left 09/25/2017   Procedure: LEFT TOTAL KNEE ARTHROPLASTY;  Surgeon: Latanya Maudlin, MD;  Location: [facility];  Service: Orthopedics;  Laterality: Left;  . TUBAL LIGATION    . TYMPANOSTOMY TUBE PLACEMENT      Social History:  reports that she has never smoked. She has never used smokeless tobacco. She reports that she does not drink alcohol or use drugs.  Allergies: Allergies  Allergen Reactions  . Adhesive [Tape] Swelling and Other (See Comments)    Reaction:  Eats in skin  . Ciprofloxacin Hives and Shortness Of Breath  . Dimethicone Hives and Shortness Of Breath  . Erythromycin Hives and Shortness Of Breath  . Latex Hives and Shortness Of Breath  . Other Hives, Shortness Of Breath and Other (See Comments)    Pt is allergic to nitrile gloves.  Pt  is allergic to antibacterial soaps- rash - states she only uses Dove soap  SCDs- patient states - had itching with this   . Penicillins Hives, Shortness Of Breath and Other (See Comments)    Has patient had a PCN reaction causing immediate rash, facial/tongue/throat swelling, SOB or lightheadedness with hypotension: Yes Has patient had a PCN reaction causing severe rash involving mucus membranes or skin necrosis: No Has patient had a PCN reaction that  required hospitalization No Has patient had a PCN reaction occurring within the last 10 years: No If all of the above answers are "NO", then may proceed with Cephalosporin use.  Marland Kitchen Pineapple Anaphylaxis  . Red Dye Hives and Shortness Of Breath  . Shellfish Allergy Hives and Shortness Of Breath  . Sulfonamide Derivatives Hives and Shortness Of Breath  . Alcohol-Sulfur [Sulfur] Swelling and Other (See Comments)    Reaction:  Localized swelling  With alcohol   . Aspirin Nausea And Vomiting  . Doxycycline Nausea And Vomiting  . Hibiclens [Chlorhexidine]     Rash   . Ceftin [Cefuroxime] Rash    Family History:  Family History  Problem Relation Age of Onset  . Alzheimer's disease Mother   . Cervical cancer Mother   . Diabetes Sister   . Heart attack Brother   . Hypertension Neg Hx        family hx  . Heart disease Neg Hx        family hx     Current Outpatient Medications:  .  albuterol (PROVENTIL HFA;VENTOLIN HFA) 108 (90 Base) MCG/ACT inhaler, Inhale 2 puffs into the lungs every 6 (six) hours as needed for wheezing or shortness of breath., Disp: 1 Inhaler, Rfl: 0 .  aspirin EC 325 MG tablet, Take 1 tablet (325 mg total) 2 (two) times daily by mouth., Disp: 30 tablet, Rfl: 0 .  atenolol (TENORMIN) 25 MG tablet, Take 1 tablet (25 mg total) by mouth every morning. For migraines, Disp: 90 tablet, Rfl: 4 .  Biotin 1 MG CAPS, Take 1 mg by mouth daily., Disp: , Rfl:  .  cholecalciferol (VITAMIN D) 1000 UNITS tablet, Take 1,000 Units by mouth daily. , Disp: , Rfl:  .  Cranberry 1000 MG CAPS, Take 1,000 mg by mouth daily., Disp: , Rfl:  .  Cyanocobalamin (VITAMIN B-12) 2500 MCG SUBL, Place 2,500 mcg under the tongue daily. , Disp: , Rfl:  .  levothyroxine (SYNTHROID) 175 MCG tablet, Take 1 tablet (175 mcg total) by mouth daily before breakfast., Disp: 90 tablet, Rfl: 0 .  mirabegron ER (MYRBETRIQ) 25 MG TB24 tablet, Take 25 mg by mouth daily., Disp: , Rfl:  .  ondansetron (ZOFRAN ODT) 4 MG  disintegrating tablet, Take 1 tablet (4 mg total) by mouth every 8 (eight) hours as needed for nausea or vomiting., Disp: 30 tablet, Rfl: 0 .  phentermine 30 MG capsule, TAKE 1 CAPSULE BY MOUTH ONCE DAILY, Disp: 90 capsule, Rfl: 0 .  promethazine (PHENERGAN) 25 MG tablet, Take 1 tablet (25 mg total) every 8 (eight) hours as needed by mouth for refractory nausea / vomiting (if response to Zofran not sufficient)., Disp: 30 tablet, Rfl: 0 .  SYNTHROID 175 MCG tablet, TAKE 1 TABLET (175 MCG TOTAL) BY MOUTH DAILY BEFORE BREAKFAST., Disp: 60 tablet, Rfl: 0 .  tamsulosin (FLOMAX) 0.4 MG CAPS capsule, Take 0.4 mg by mouth daily., Disp: , Rfl:   Review of Systems:  Constitutional: Denies fever, chills, diaphoresis, appetite change and fatigue.  HEENT: Denies  photophobia, eye pain, redness, hearing loss, ear pain, congestion, sore throat, rhinorrhea, sneezing, mouth sores, trouble swallowing, neck pain, neck stiffness and tinnitus.   Respiratory: Denies SOB, DOE, cough, chest tightness,  and wheezing.   Cardiovascular: Denies chest pain, palpitations and leg swelling.  Gastrointestinal: Denies nausea, vomiting, abdominal pain, diarrhea, constipation, blood in stool and abdominal distention.  Genitourinary: Denies dysuria, urgency, frequency, hematuria, flank pain and difficulty urinating.  Endocrine: Denies: hot or cold intolerance, sweats, changes in hair or nails, polyuria, polydipsia. Musculoskeletal: Denies myalgias, back pain, joint swelling, arthralgias and gait problem.  Skin: Denies pallor, rash and wound.  Neurological: Denies dizziness, seizures, syncope, weakness, light-headedness, numbness and headaches.  Hematological: Denies adenopathy. Easy bruising, personal or family bleeding history  Psychiatric/Behavioral: Denies suicidal ideation, mood changes, confusion, nervousness, sleep disturbance and agitation    Physical Exam: Vitals:   10/14/18 1452  BP: 130/80  Temp: 98.3 F (36.8 C)    TempSrc: Oral  Weight: 178 lb 6.4 oz (80.9 kg)    Body mass index is 29.01 kg/m.   Constitutional: NAD, calm, comfortable, walks with a cane Eyes: PERRL, lids and conjunctivae normal ENMT: Mucous membranes are moist. Posterior pharynx clear of any exudate or lesions. Normal dentition.  Neck: normal, supple, no masses, no thyromegaly Respiratory: clear to auscultation bilaterally, no wheezing, no crackles. Normal respiratory effort. No accessory muscle use.  Cardiovascular: Regular rate and rhythm, no murmurs / rubs / gallops. No extremity edema. 2+ pedal pulses. No carotid bruits.  Abdomen: no tenderness, no masses palpated. No hepatosplenomegaly. Bowel sounds positive.  Musculoskeletal: no clubbing / cyanosis. No joint deformity upper and lower extremities. Good ROM, no contractures. Normal muscle tone.  Skin: no rashes, lesions, ulcers. No induration Neurologic: Grossly intact and nonfocal Psychiatric: Normal judgment and insight. Alert and oriented x 3. Normal mood.    Impression and Plan:  Acquired hypothyroidism -Takes Synthroid 175 mcg 6 days a week, does not use on Sundays. -Most recent TSH was within normal limits. -Plan to recheck during annual physical.  WEIGHT GAIN -She has been on phentermine for years. -Have discussed with her the fact that phentermine is typically a medication that we will use for 3 months and then discontinue. -She is very hesitant to discontinue phentermine at this time.  History of total knee arthroplasty, left -Follows with Dr. Gladstone Lighter, still using a cane.  Nephrolithiasis -Follows with Dr. Nicolette Bang.  Essential hypertension -Well-controlled. -Continue current management.     Patient Instructions  -It was nice meeting you!  -Please schedule follow-up with me in 3 to 4 months for your annual physical.  Come fasting to that visit.     Lelon Frohlich, MD [place] [facility]

## 2018-10-16 IMAGING — CT CT ANGIO CHEST
2 of 6 series · 18 of 36 positions shown · IV contrast (ISOVUE 370)
Comparison: Chest radiograph performed earlier today at [DATE] a.m.,
and CT of the chest performed 05/28/2012

CLINICAL DATA: Acute onset of fever and tachycardia. Recent left
knee replacement. Confusion.

EXAM:
CT ANGIOGRAPHY CHEST WITH CONTRAST
TECHNIQUE: Multidetector CT imaging of the chest was performed using the
standard protocol during bolus administration of intravenous
contrast. Multiplanar CT image reconstructions and MIPs were
obtained to evaluate the vascular anatomy.
CONTRAST:  100 mL XRNTQ5-KVY IOPAMIDOL (XRNTQ5-KVY) INJECTION 76%

[Series 7: coronal mpr · coronal · 0.52mm/px · 1 of 125 slices shown]
[im 63/125  mediastinal]
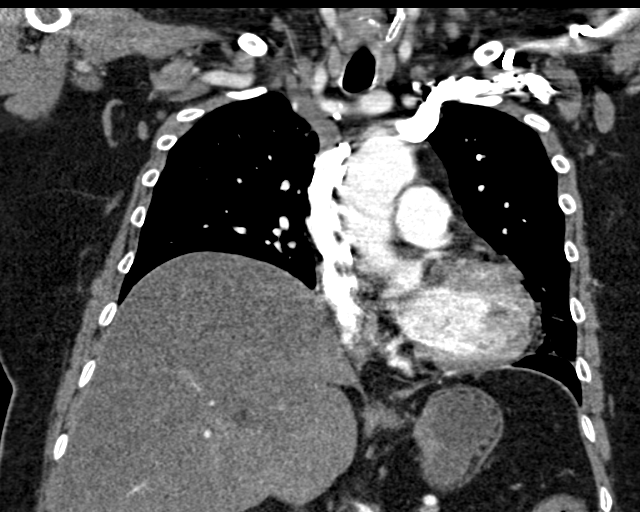

[Series 12: thins for pacs · axial · 0.73mm/px · z∈[-267,-32]mm · 17 of 263 slices shown]
[im 14/263  lung]
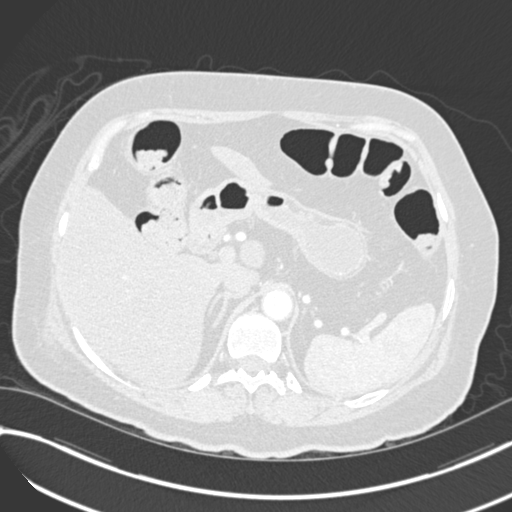
[im 27/263  mediastinal]
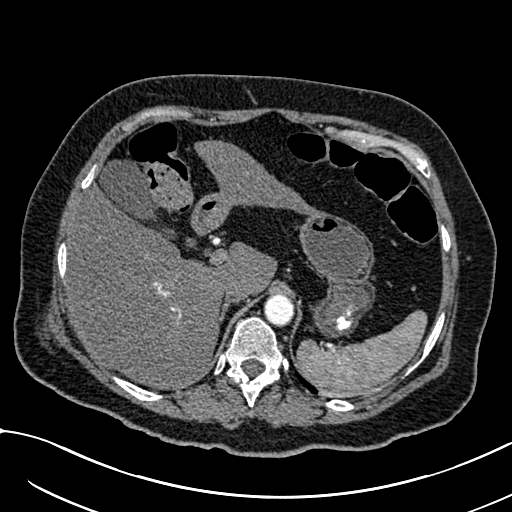
[im 40/263  lung]
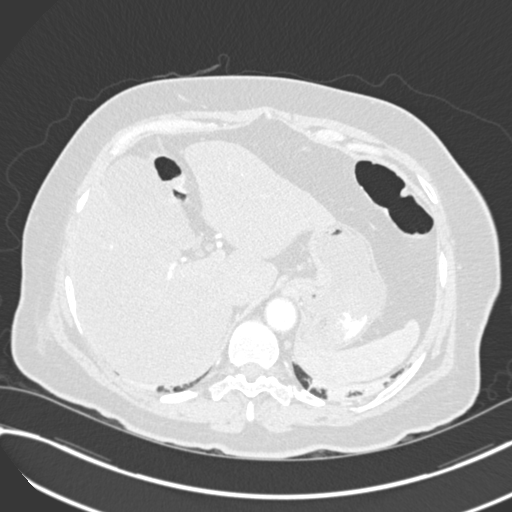
[im 53/263  mediastinal]
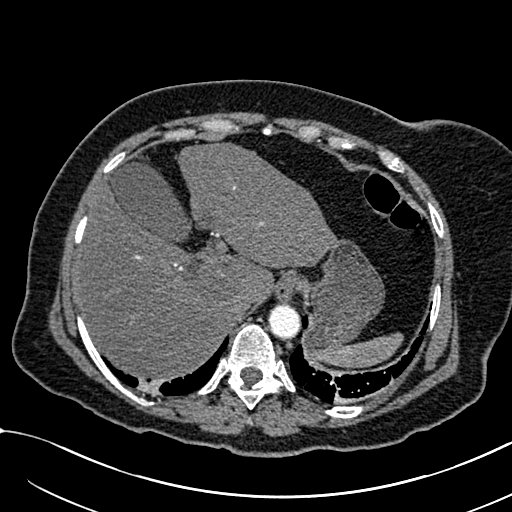
[im 79/263  lung]
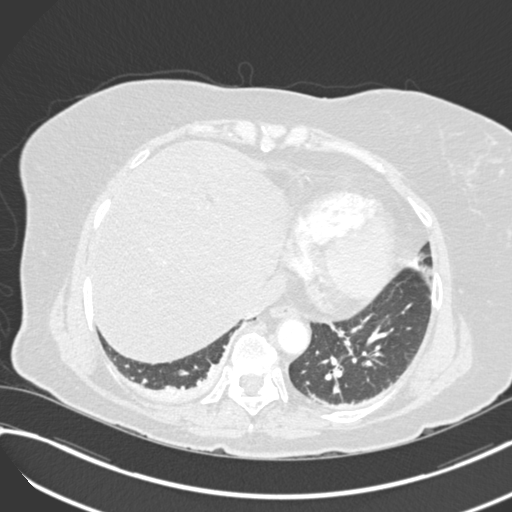
[im 92/263  mediastinal]
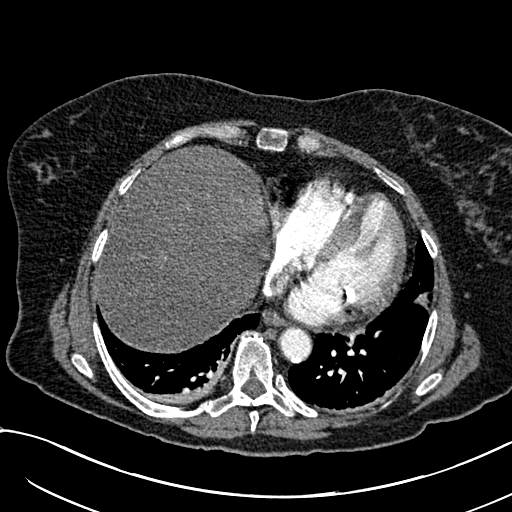
[im 105/263  lung]
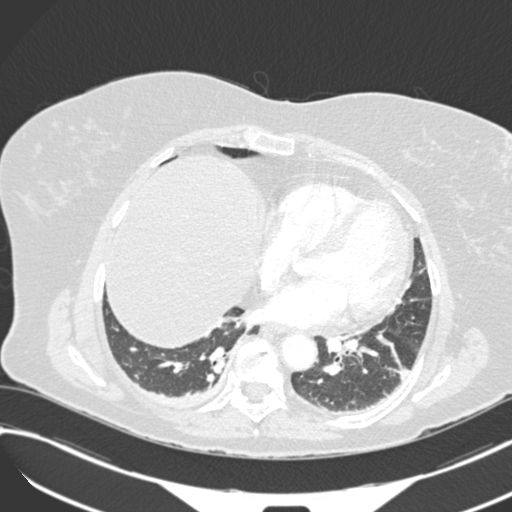
[im 118/263  mediastinal]
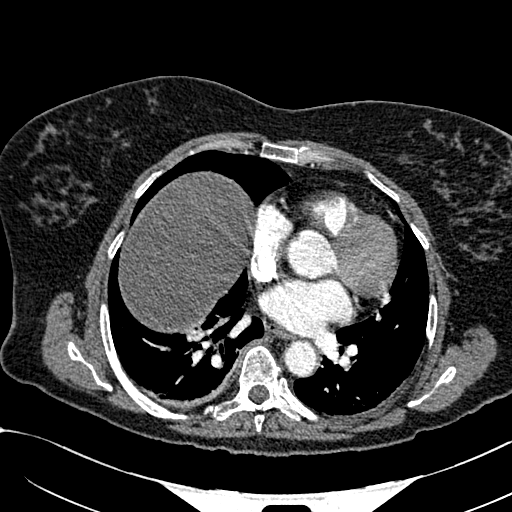
[im 132/263  lung]
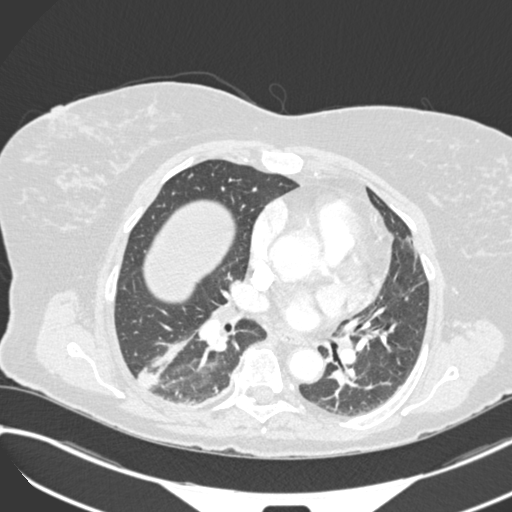
[im 145/263  mediastinal]
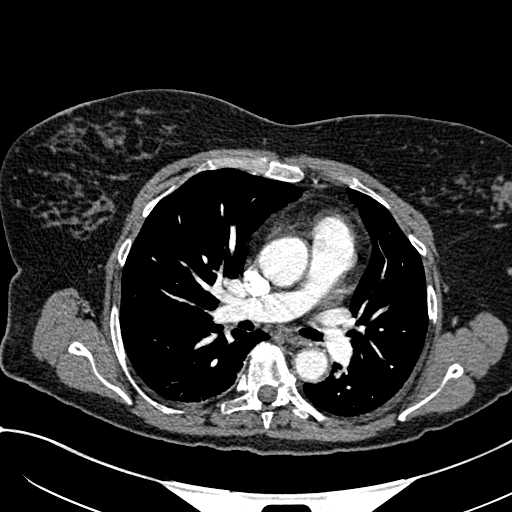
[im 158/263  lung]
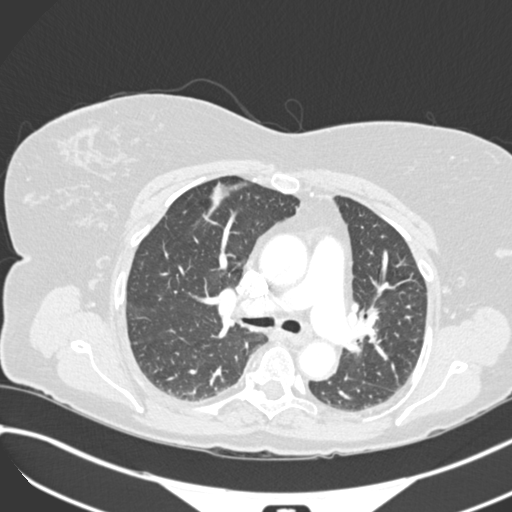
[im 171/263  mediastinal]
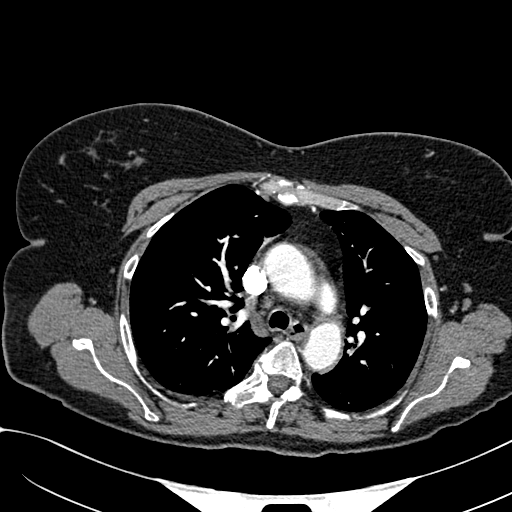
[im 184/263  lung]
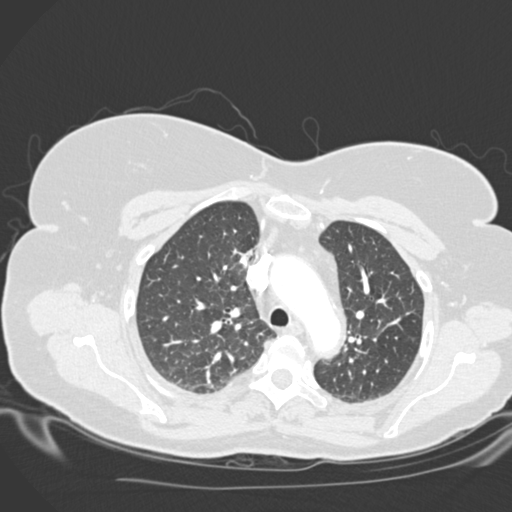
[im 210/263  mediastinal]
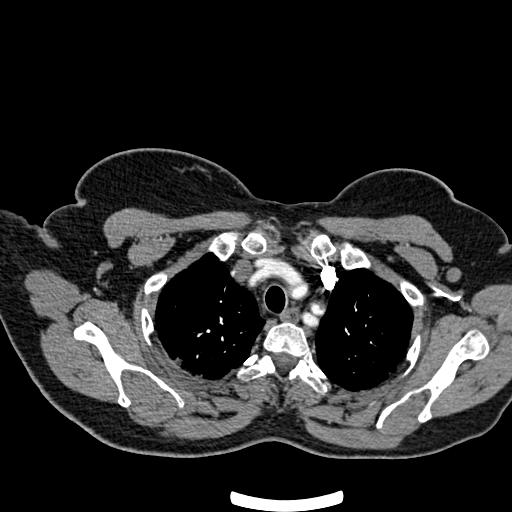
[im 223/263  lung]
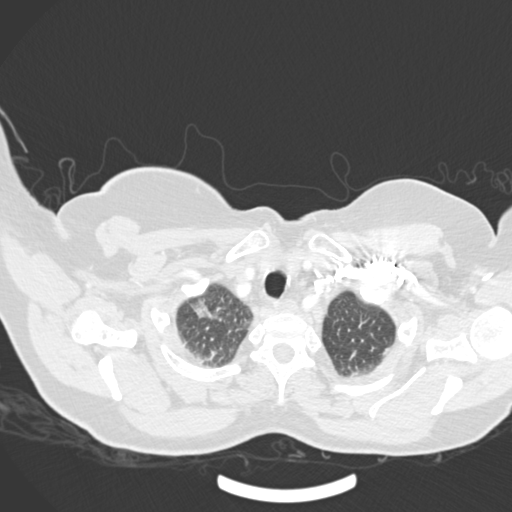
[im 236/263  mediastinal]
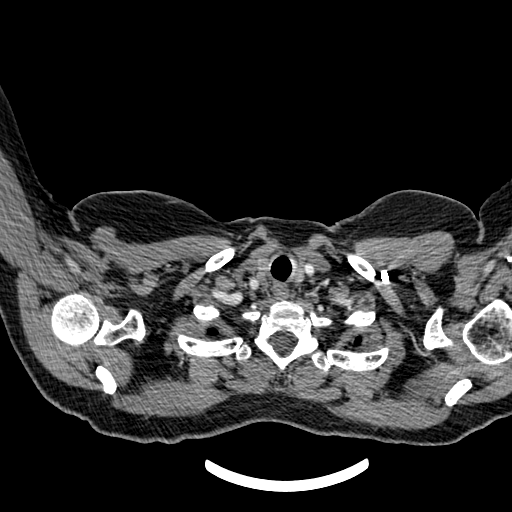
[im 249/263  lung]
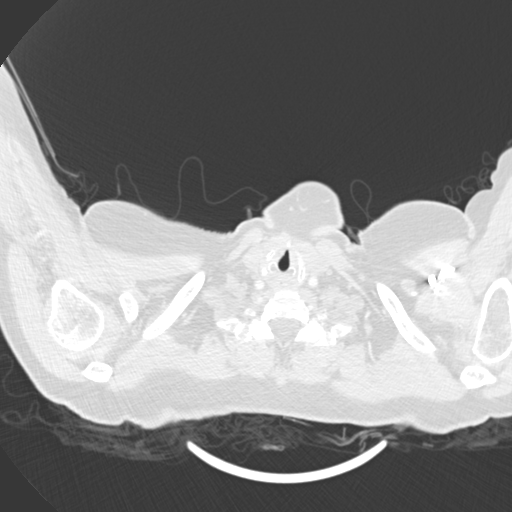

[18 of 36 positions shown; findings below may reference images not displayed]

FINDINGS: Cardiovascular:  There is no evidence of pulmonary embolus.

The heart is normal in size. Mild mural thrombus is suggested along
the proximal great vessels, without significant luminal narrowing.

Mediastinum/Nodes: The mediastinum is otherwise unremarkable. No
mediastinal lymphadenopathy is seen. No pericardial effusion is
identified. The thyroid gland is unremarkable. No axillary
lymphadenopathy is seen.

Lungs/Pleura: Mild bibasilar atelectasis or scarring is noted. No
pleural effusion or pneumothorax is seen. No masses are identified.

Upper Abdomen: The visualized portions of the liver and spleen are
unremarkable. The gallbladder is grossly unremarkable in appearance.
The visualized portions of the pancreas and right adrenal gland are
within normal limits.

Musculoskeletal: No acute osseous abnormalities are identified.
There is mild chronic deformity of the body of the sternum. The
visualized musculature is unremarkable in appearance.

Review of the MIP images confirms the above findings.
IMPRESSION: 1. No evidence of pulmonary embolus.
2. Mild bibasilar atelectasis or scarring noted. Lungs otherwise
clear.

## 2018-10-16 IMAGING — CR DG CHEST 2V
2 series · 2 of 2 positions shown · non-contrast
Comparison: Chest radiograph performed 09/20/2017

CLINICAL DATA: Acute onset of fever.

EXAM:
CHEST  2 VIEW

[w chest lat]
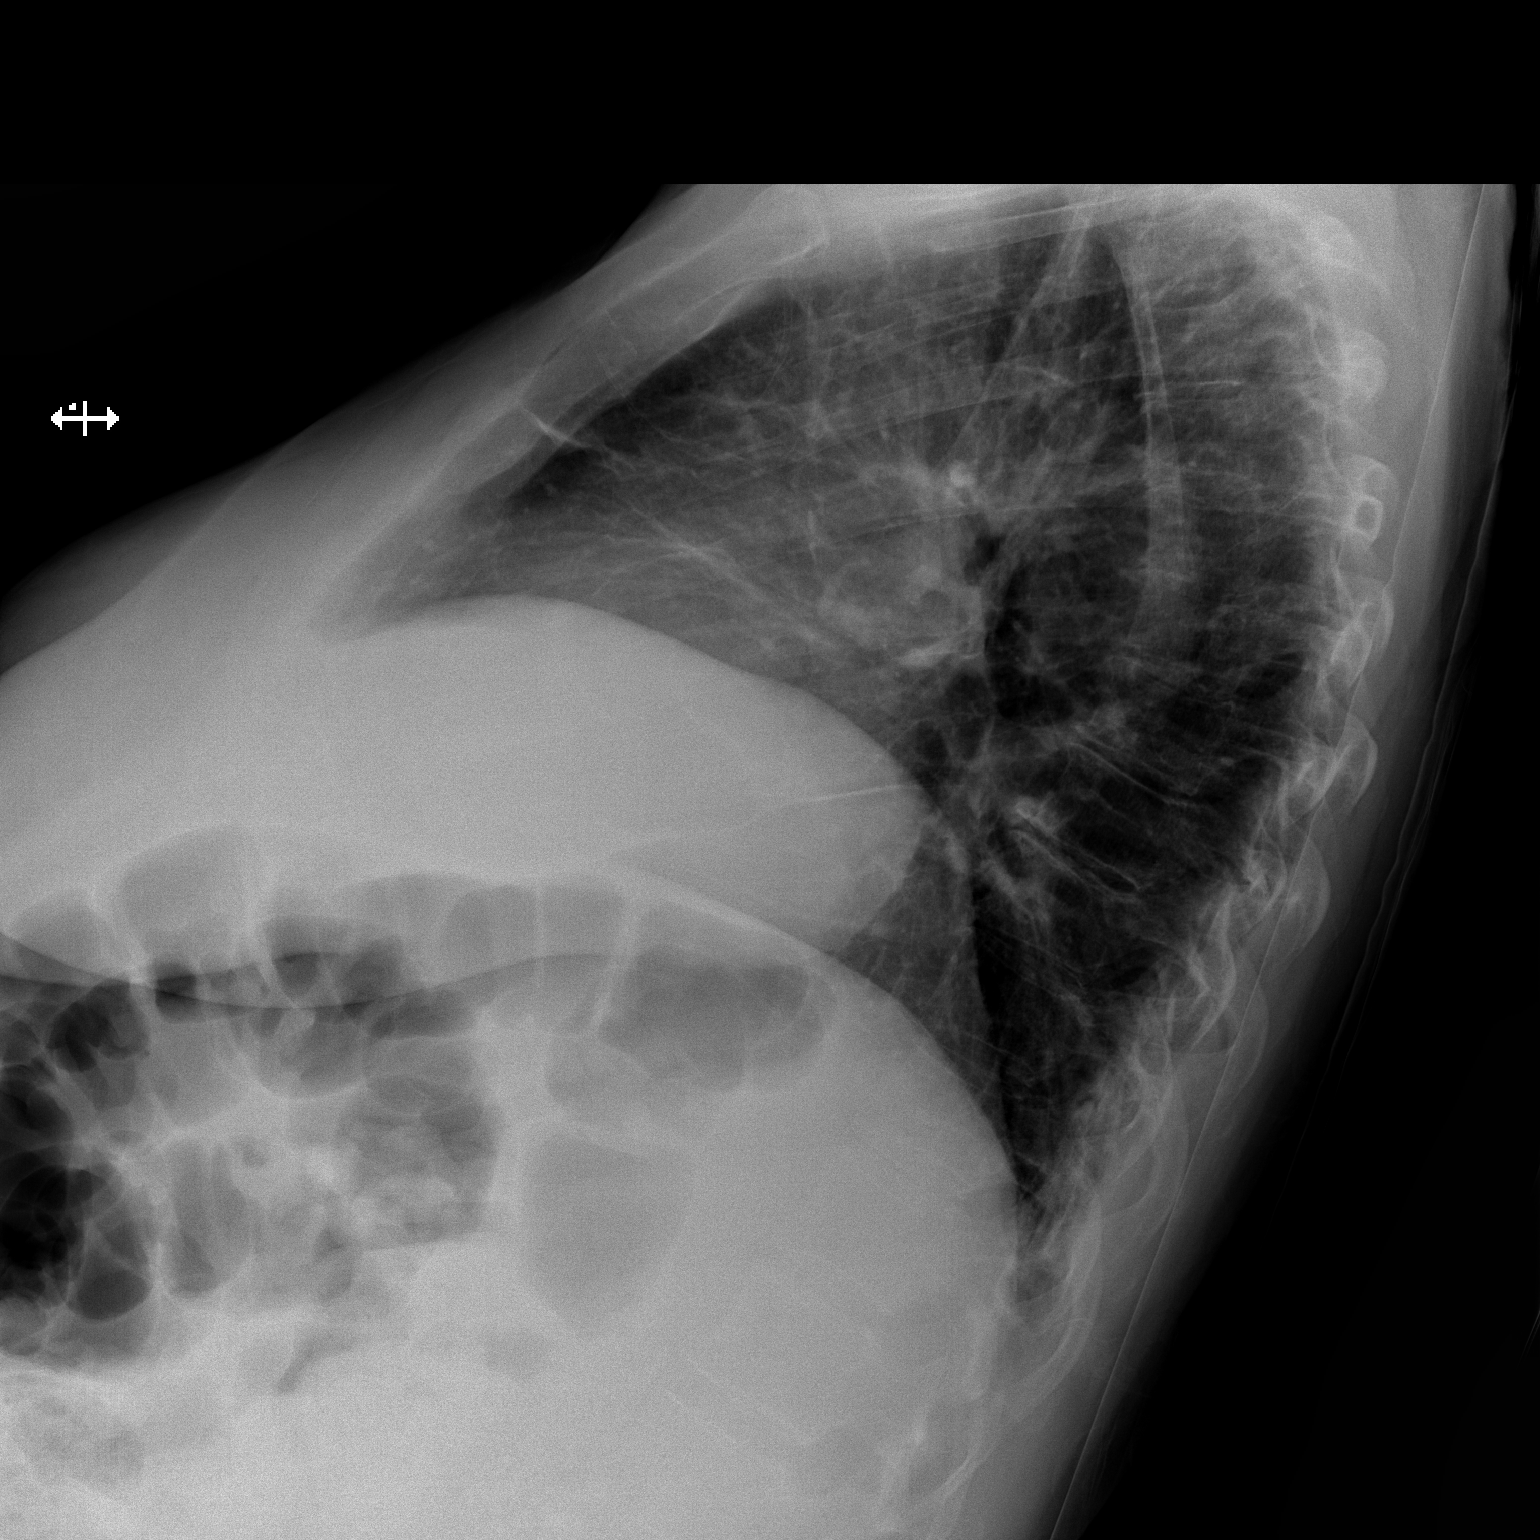

[x chest ap]
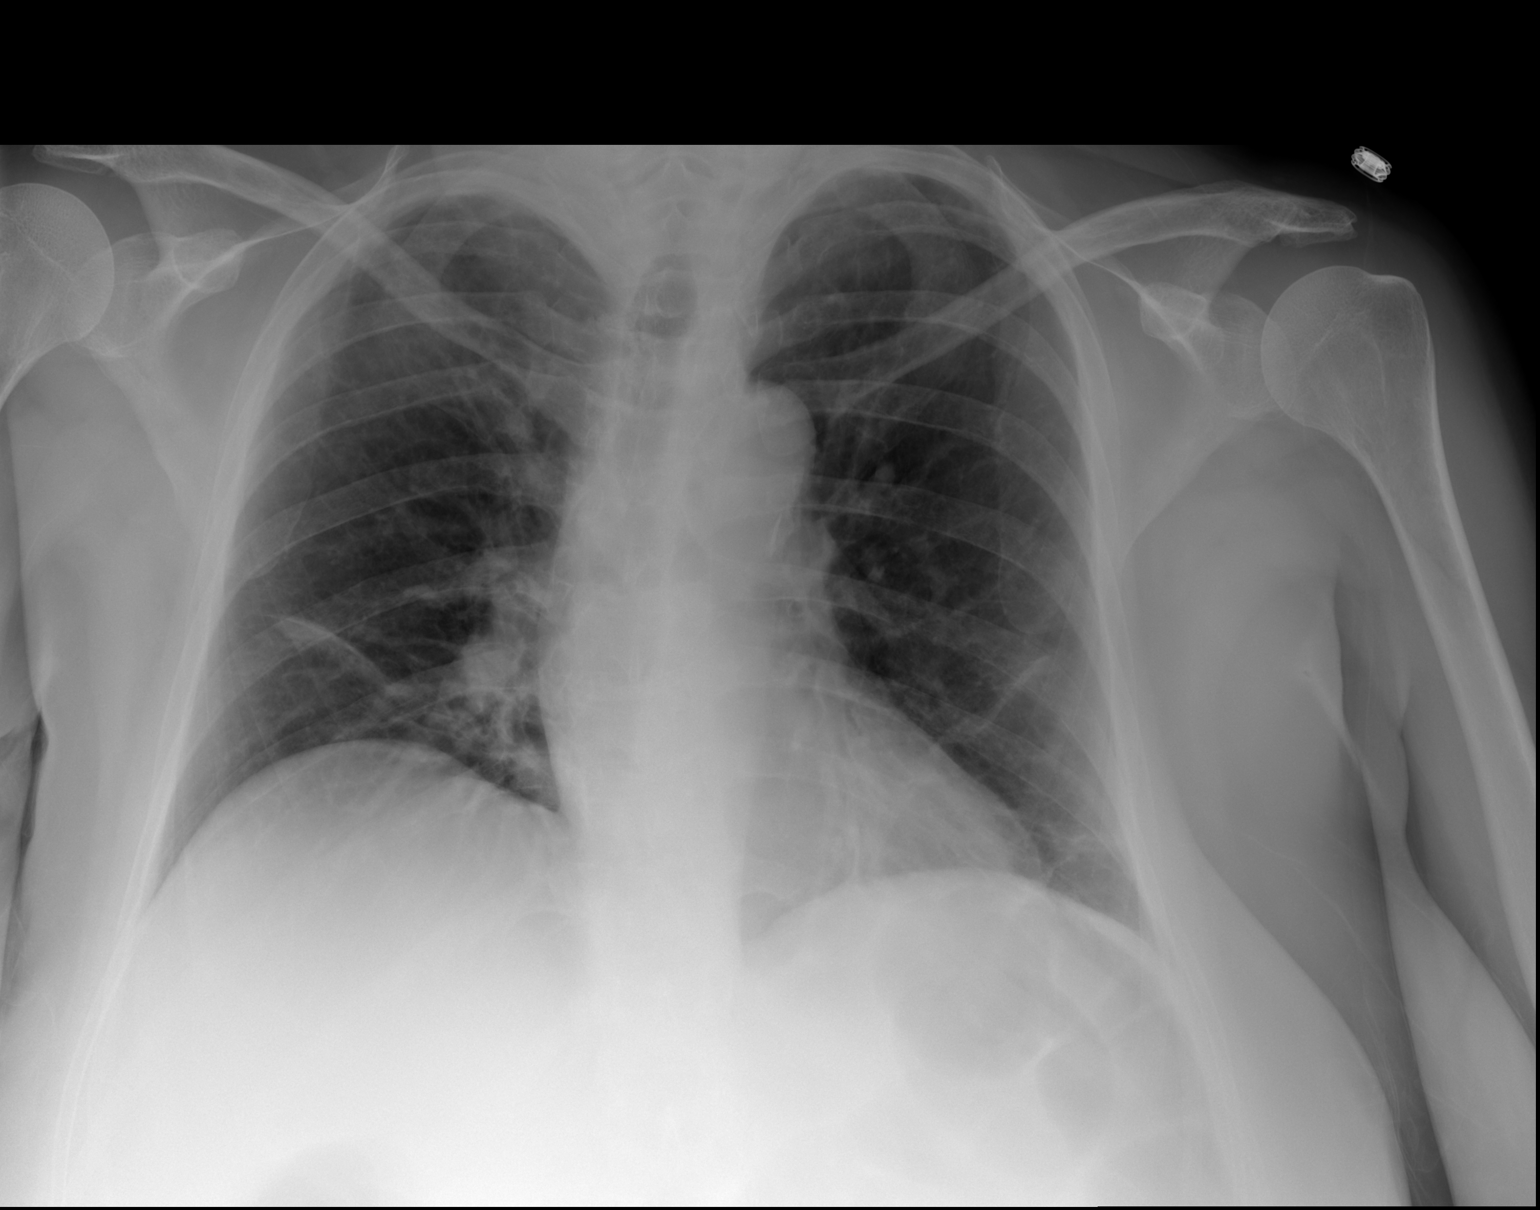

[2 of 2 positions shown; findings below may reference images not displayed]

FINDINGS: The lungs are well-aerated. Mild bibasilar linear atelectasis is
noted. There is no evidence of pleural effusion or pneumothorax.

The heart is normal in size; the mediastinal contour is within
normal limits. No acute osseous abnormalities are seen.
IMPRESSION: Mild bibasilar linear atelectasis noted. No definite evidence for
focal airspace consolidation.

## 2018-10-30 ENCOUNTER — Other Ambulatory Visit: Payer: Self-pay | Admitting: Internal Medicine

## 2018-10-31 DIAGNOSIS — N3281 Overactive bladder: Secondary | ICD-10-CM | POA: Diagnosis not present

## 2018-10-31 DIAGNOSIS — N201 Calculus of ureter: Secondary | ICD-10-CM | POA: Diagnosis not present

## 2018-11-18 ENCOUNTER — Encounter: Payer: Self-pay | Admitting: Family Medicine

## 2018-11-18 ENCOUNTER — Telehealth: Payer: Self-pay | Admitting: Internal Medicine

## 2018-11-18 ENCOUNTER — Ambulatory Visit: Payer: 59 | Admitting: Family Medicine

## 2018-11-18 VITALS — BP 140/90 | HR 100 | Temp 98.2°F | Wt 174.5 lb

## 2018-11-18 DIAGNOSIS — J209 Acute bronchitis, unspecified: Secondary | ICD-10-CM

## 2018-11-18 MED ORDER — CEPHALEXIN 500 MG PO CAPS: 500.0000 mg | ORAL_CAPSULE | Freq: Three times a day (TID) | ORAL | 0 refills | Status: AC

## 2018-11-18 MED ORDER — BENZONATATE 200 MG PO CAPS: 200.0000 mg | ORAL_CAPSULE | Freq: Two times a day (BID) | ORAL | 0 refills | Status: DC | PRN

## 2018-11-18 NOTE — Progress Notes (Signed)
   Subjective:    Patient ID: Katelyn [name], female    DOB: 1956/02/14, 63 y.o.   MRN: [id number]  HPI Here for one week of chest congestion and coughing up green sputum. No fever. Her ribs hurt from all the coughing.    Review of Systems  Constitutional: Negative.   HENT: Positive for congestion and postnasal drip. Negative for sinus pressure, sinus pain and sore throat.   Eyes: Negative.   Respiratory: Positive for cough and chest tightness.        Objective:   Physical Exam Constitutional:      Appearance: Normal appearance.  HENT:     Right Ear: Tympanic membrane and ear canal normal.     Left Ear: Tympanic membrane and ear canal normal.     Nose: Nose normal.     Mouth/Throat:     Pharynx: Oropharynx is clear.  Eyes:     Conjunctiva/sclera: Conjunctivae normal.  Pulmonary:     Effort: Pulmonary effort is normal. No respiratory distress.     Breath sounds: No wheezing or rales.     Comments: Scattered rhonchi  Neurological:     Mental Status: She is alert.           Assessment & Plan:  Bronchitis, treat with Keflex. Add Benzonatate prn.  Alysia Penna, MD

## 2018-11-18 NOTE — Telephone Encounter (Signed)
Copied from [place] [phone]. Topic: General - Inquiry >> Nov 18, 2018  4:25 PM Judyann Munson wrote: Reason for CRM:  the pharmacy  is calling to advise that the patient is showing  in there chart that she is allergic to cephALEXin (KEFLEX) 500 MG capsule.

## 2018-11-19 NOTE — Telephone Encounter (Signed)
Yes I saw that too, but she insisted that Keflex is the only antibiotic that she can take. She has always done well with this.

## 2018-11-19 NOTE — Telephone Encounter (Signed)
I believe you saw this patient yesterday. Thoughts?

## 2018-11-19 NOTE — Telephone Encounter (Signed)
Pharmacist is aware and patient has picked up her prescription.

## 2018-11-24 ENCOUNTER — Other Ambulatory Visit: Payer: Self-pay | Admitting: Internal Medicine

## 2018-11-29 DIAGNOSIS — Z96652 Presence of left artificial knee joint: Secondary | ICD-10-CM | POA: Diagnosis not present

## 2018-11-29 DIAGNOSIS — M25512 Pain in left shoulder: Secondary | ICD-10-CM | POA: Diagnosis not present

## 2019-01-13 ENCOUNTER — Encounter: Payer: 59 | Admitting: Internal Medicine

## 2019-02-10 ENCOUNTER — Encounter: Payer: 59 | Admitting: Internal Medicine

## 2019-04-15 ENCOUNTER — Telehealth: Payer: Self-pay | Admitting: *Deleted

## 2019-04-15 NOTE — Telephone Encounter (Signed)
Virtual visit to discuss.

## 2019-04-15 NOTE — Telephone Encounter (Signed)
Virtual visit scheduled.  

## 2019-04-15 NOTE — Telephone Encounter (Signed)
Patient is calling stating she needs an office visit because her work will need forms filled out allowing her to work without a mask due to asthma.

## 2019-04-17 ENCOUNTER — Ambulatory Visit (INDEPENDENT_AMBULATORY_CARE_PROVIDER_SITE_OTHER): Payer: 59 | Admitting: Internal Medicine

## 2019-04-17 ENCOUNTER — Other Ambulatory Visit: Payer: Self-pay

## 2019-04-17 DIAGNOSIS — R635 Abnormal weight gain: Secondary | ICD-10-CM | POA: Diagnosis not present

## 2019-04-17 NOTE — Progress Notes (Signed)
Virtual Visit via Video Note  I connected with Katelyn [name] on 04/17/19 at  2:30 PM EDT by a video enabled telemedicine application and verified that I am speaking with the correct person using two identifiers.  Location patient: home Location provider: work office Persons participating in the virtual visit: patient, provider  I discussed the limitations of evaluation and management by telemedicine and the availability of in person appointments. The patient expressed understanding and agreed to proceed.   HPI: She has scheduled this visit to discuss continued phentermine prescription as well as forms that she needs filled out for work.  She has been on phentermine for years.  She states that when she was initially diagnosed with thyroid issues she gained a lot of weight and this is when the prescription started.  She is very adamant that she would like her phentermine renewed.  She has also been having difficulty wearing her mask at work.  She gets really hot, short of breath and with a runny nose.  She does not have any pulmonary issues that I am aware of.  She works at [employer].  She has more than 6 feet distance from coworkers at all times.    ROS: Constitutional: Denies fever, chills, diaphoresis, appetite change and fatigue.  HEENT: Denies photophobia, eye pain, redness, hearing loss, ear pain, congestion, sore throat, rhinorrhea, sneezing, mouth sores, trouble swallowing, neck pain, neck stiffness and tinnitus.   Respiratory: Denies SOB, DOE, cough, chest tightness,  and wheezing.   Cardiovascular: Denies chest pain, palpitations and leg swelling.  Gastrointestinal: Denies nausea, vomiting, abdominal pain, diarrhea, constipation, blood in stool and abdominal distention.  Genitourinary: Denies dysuria, urgency, frequency, hematuria, flank pain and difficulty urinating.  Endocrine: Denies: hot or cold intolerance, sweats, changes in hair or nails, polyuria, polydipsia.  Musculoskeletal: Denies myalgias, back pain, joint swelling, arthralgias and gait problem.  Skin: Denies pallor, rash and wound.  Neurological: Denies dizziness, seizures, syncope, weakness, light-headedness, numbness and headaches.  Hematological: Denies adenopathy. Easy bruising, personal or family bleeding history  Psychiatric/Behavioral: Denies suicidal ideation, mood changes, confusion, nervousness, sleep disturbance and agitation   Past Medical History:  Diagnosis Date  . Anemia   . Headache(784.0)    cluster headaches, migraines- states is taking Atenolol at present as directed per Dr Lacinda Axon  to prevent migraine from increased stress-  states if on medication for several weeks that heart rate will drop to 40's  . History of kidney stones   . HYPOTHYROIDISM 05/27/2007  . Pneumonia    post op x 2 last yrs ago  . PONV (postoperative nausea and vomiting)    likes phenergan with anesthesia, has woken up with 2 surgeries per patient   . WEIGHT GAIN 07/15/2009    Past Surgical History:  Procedure Laterality Date  . ABDOMINAL HYSTERECTOMY    . APPENDECTOMY    . CYSTOSCOPY WITH RETROGRADE PYELOGRAM, URETEROSCOPY AND STENT PLACEMENT Right 04/26/2017   Procedure: CYSTOSCOPY WITH RETROGRADE PYELOGRAM, URETEROSCOPY AND STENT PLACEMENT;  Surgeon: Cleon Gustin, MD;  Location: [facility];  Service: Urology;  Laterality: Right;  . CYSTOSCOPY WITH RETROGRADE PYELOGRAM, URETEROSCOPY AND STENT PLACEMENT Right 08/19/2017   Procedure: CYSTOSCOPY WITH RETROGRADE PYELOGRAM, URETEROSCOPY;  Surgeon: Cleon Gustin, MD;  Location: [facility];  Service: Urology;  Laterality: Right;  . DIAGNOSTIC LAPAROSCOPY    . FOOT SURGERY  2007   tumor removed from right foot/   . HOLMIUM LASER APPLICATION Right 6/64/4034   Procedure: HOLMIUM LASER  APPLICATION;  Surgeon: Cleon Gustin, MD;  Location: [facility];  Service: Urology;  Laterality: Right;  . KNEE ARTHROSCOPY Left    x 3  .  LAPAROTOMY  10/30/2011   Procedure: EXPLORATORY LAPAROTOMY;  Surgeon: Janie Morning, MD;  Location: [facility];  Service: Gynecology;  Laterality: N/A;  . mortons neuroma removed from foot    . SALPINGOOPHORECTOMY  10/30/2011   Procedure: SALPINGO OOPHERECTOMY;  Surgeon: Janie Morning, MD;  Location: [facility];  Service: Gynecology;  Laterality: Right;  RIGHT  . TONSILLECTOMY    . TOTAL KNEE ARTHROPLASTY Left 09/25/2017   Procedure: LEFT TOTAL KNEE ARTHROPLASTY;  Surgeon: Latanya Maudlin, MD;  Location: [facility];  Service: Orthopedics;  Laterality: Left;  . TUBAL LIGATION    . TYMPANOSTOMY TUBE PLACEMENT      Family History  Problem Relation Age of Onset  . Alzheimer's disease Mother   . Cervical cancer Mother   . Diabetes Sister   . Heart attack Brother   . Hypertension Neg Hx        family hx  . Heart disease Neg Hx        family hx    SOCIAL HX:   reports that she has never smoked. She has never used smokeless tobacco. She reports that she does not drink alcohol or use drugs.   Current Outpatient Medications:  .  albuterol (PROVENTIL HFA;VENTOLIN HFA) 108 (90 Base) MCG/ACT inhaler, Inhale 2 puffs into the lungs every 6 (six) hours as needed for wheezing or shortness of breath., Disp: 1 Inhaler, Rfl: 0 .  aspirin EC 325 MG tablet, Take 1 tablet (325 mg total) 2 (two) times daily by mouth., Disp: 30 tablet, Rfl: 0 .  atenolol (TENORMIN) 25 MG tablet, Take 1 tablet (25 mg total) by mouth every morning. For migraines, Disp: 90 tablet, Rfl: 4 .  benzonatate (TESSALON) 200 MG capsule, Take 1 capsule (200 mg total) by mouth 2 (two) times daily as needed for cough., Disp: 40 capsule, Rfl: 0 .  Biotin 1 MG CAPS, Take 1 mg by mouth daily., Disp: , Rfl:  .  cholecalciferol (VITAMIN D) 1000 UNITS tablet, Take 1,000 Units by mouth daily. , Disp: , Rfl:  .  Cranberry 1000 MG CAPS, Take 1,000 mg by mouth daily., Disp: , Rfl:  .  Cyanocobalamin (VITAMIN B-12) 2500 MCG SUBL, Place 2,500 mcg under the  tongue daily. , Disp: , Rfl:  .  levothyroxine (SYNTHROID) 175 MCG tablet, Take 1 tablet (175 mcg total) by mouth daily before breakfast., Disp: 90 tablet, Rfl: 0 .  mirabegron ER (MYRBETRIQ) 25 MG TB24 tablet, Take 25 mg by mouth daily., Disp: , Rfl:  .  ondansetron (ZOFRAN ODT) 4 MG disintegrating tablet, Take 1 tablet (4 mg total) by mouth every 8 (eight) hours as needed for nausea or vomiting., Disp: 30 tablet, Rfl: 0 .  phentermine 30 MG capsule, TAKE 1 CAPSULE BY MOUTH ONCE DAILY, Disp: 90 capsule, Rfl: 0 .  promethazine (PHENERGAN) 25 MG tablet, Take 1 tablet (25 mg total) every 8 (eight) hours as needed by mouth for refractory nausea / vomiting (if response to Zofran not sufficient)., Disp: 30 tablet, Rfl: 0 .  SYNTHROID 175 MCG tablet, TAKE 1 TABLET (175 MCG TOTAL) BY MOUTH DAILY BEFORE BREAKFAST., Disp: 90 tablet, Rfl: 1 .  tamsulosin (FLOMAX) 0.4 MG CAPS capsule, Take 0.4 mg by mouth daily., Disp: , Rfl:   EXAM:   VITALS per patient if applicable: None reported  GENERAL: alert,  oriented, appears well and in no acute distress  HEENT: atraumatic, conjunttiva clear, no obvious abnormalities on inspection of external nose and ears  NECK: normal movements of the head and neck  LUNGS: on inspection no signs of respiratory distress, breathing rate appears normal, no obvious gross increased work of breathing, gasping or wheezing  CV: no obvious cyanosis  MS: moves all visible extremities without noticeable abnormality  PSYCH/NEURO: pleasant and cooperative, no obvious depression or anxiety, speech and thought processing grossly intact  ASSESSMENT AND PLAN:   Work forms -Will fill out forms stating that she be allowed to not wear a mask as long as she is greater than 6 feet away from coworkers, she understands that she needs to wear her mask in any situation that puts her less than that distance away from colleagues.  WEIGHT GAIN -I do not feel totally comfortable with prescribing  long-term phentermine for weight loss issues. -I will do more research on this and advise patient next week on my decision.  I may decide to refer her to weight loss management if I do not feel comfortable prescribing it.     I discussed the assessment and treatment plan with the patient. The patient was provided an opportunity to ask questions and all were answered. The patient agreed with the plan and demonstrated an understanding of the instructions.   The patient was advised to call back or seek an in-person evaluation if the symptoms worsen or if the condition fails to improve as anticipated.    Lelon Frohlich, MD  [facility]

## 2019-04-28 ENCOUNTER — Encounter: Payer: Self-pay | Admitting: Internal Medicine

## 2019-04-28 ENCOUNTER — Ambulatory Visit (INDEPENDENT_AMBULATORY_CARE_PROVIDER_SITE_OTHER): Payer: 59 | Admitting: Internal Medicine

## 2019-04-28 ENCOUNTER — Other Ambulatory Visit: Payer: Self-pay

## 2019-04-28 VITALS — BP 120/84 | HR 64 | Temp 98.3°F | Ht 65.5 in | Wt 188.2 lb

## 2019-04-28 DIAGNOSIS — Z1211 Encounter for screening for malignant neoplasm of colon: Secondary | ICD-10-CM

## 2019-04-28 DIAGNOSIS — E039 Hypothyroidism, unspecified: Secondary | ICD-10-CM

## 2019-04-28 DIAGNOSIS — E669 Obesity, unspecified: Secondary | ICD-10-CM

## 2019-04-28 DIAGNOSIS — Z0000 Encounter for general adult medical examination without abnormal findings: Secondary | ICD-10-CM

## 2019-04-28 DIAGNOSIS — I10 Essential (primary) hypertension: Secondary | ICD-10-CM

## 2019-04-28 LAB — COMPREHENSIVE METABOLIC PANEL
ALT: 22 U/L (ref 0–35)
AST: 19 U/L (ref 0–37)
Albumin: 4.2 g/dL (ref 3.5–5.2)
Alkaline Phosphatase: 86 U/L (ref 39–117)
BUN: 9 mg/dL (ref 6–23)
CO2: 29 mEq/L (ref 19–32)
Calcium: 9.2 mg/dL (ref 8.4–10.5)
Chloride: 105 mEq/L (ref 96–112)
Creatinine, Ser: 0.77 mg/dL (ref 0.40–1.20)
GFR: 75.78 mL/min (ref >60.00)
Glucose, Bld: 91 mg/dL (ref 70–99)
Potassium: 4.6 mEq/L (ref 3.5–5.1)
Sodium: 140 mEq/L (ref 135–145)
Total Bilirubin: 0.5 mg/dL (ref 0.2–1.2)
Total Protein: 6.6 g/dL (ref 6.0–8.3)

## 2019-04-28 LAB — LIPID PANEL
Cholesterol: 190 mg/dL (ref 0–200)
HDL: 34.7 mg/dL — ABNORMAL LOW (ref >39.00)
NonHDL: 155.55
Total CHOL/HDL Ratio: 5
Triglycerides: 214 mg/dL — ABNORMAL HIGH (ref 0.0–149.0)
VLDL: 42.8 mg/dL — ABNORMAL HIGH (ref 0.0–40.0)

## 2019-04-28 LAB — CBC WITH DIFFERENTIAL/PLATELET
Basophils Absolute: 0 10*3/uL (ref 0.0–0.1)
Basophils Relative: 0.5 % (ref 0.0–3.0)
Eosinophils Absolute: 0.3 10*3/uL (ref 0.0–0.7)
Eosinophils Relative: 3.5 % (ref 0.0–5.0)
HCT: 42.2 % (ref 36.0–46.0)
Hemoglobin: 14.1 g/dL (ref 12.0–15.0)
Lymphocytes Relative: 28.3 % (ref 12.0–46.0)
Lymphs Abs: 2.2 10*3/uL (ref 0.7–4.0)
MCHC: 33.3 g/dL (ref 30.0–36.0)
MCV: 85.5 fl (ref 78.0–100.0)
Monocytes Absolute: 0.6 10*3/uL (ref 0.1–1.0)
Monocytes Relative: 8.3 % (ref 3.0–12.0)
Neutro Abs: 4.6 10*3/uL (ref 1.4–7.7)
Neutrophils Relative %: 59.4 % (ref 43.0–77.0)
Platelets: 347 10*3/uL (ref 150.0–400.0)
RBC: 4.94 Mil/uL (ref 3.87–5.11)
RDW: 13.3 % (ref 11.5–15.5)
WBC: 7.7 10*3/uL (ref 4.0–10.5)

## 2019-04-28 LAB — VITAMIN B12: Vitamin B-12: 451 pg/mL (ref 211–911)

## 2019-04-28 LAB — VITAMIN D 25 HYDROXY (VIT D DEFICIENCY, FRACTURES): VITD: 15.56 ng/mL — ABNORMAL LOW (ref 30.00–100.00)

## 2019-04-28 LAB — HEMOGLOBIN A1C: Hgb A1c MFr Bld: 5.7 % (ref 4.6–6.5)

## 2019-04-28 LAB — LDL CHOLESTEROL, DIRECT: Direct LDL: 132 mg/dL

## 2019-04-28 LAB — TSH: TSH: 8.78 u[IU]/mL — ABNORMAL HIGH (ref 0.35–4.50)

## 2019-04-28 MED ORDER — ATENOLOL 25 MG PO TABS: 25.0000 mg | ORAL_TABLET | ORAL | 1 refills | Status: DC

## 2019-04-28 MED ORDER — SYNTHROID 175 MCG PO TABS: ORAL_TABLET | ORAL | 1 refills | Status: DC

## 2019-04-28 MED ORDER — LEVOTHYROXINE SODIUM 175 MCG PO TABS: 175.0000 ug | ORAL_TABLET | Freq: Every day | ORAL | 1 refills | Status: DC

## 2019-04-28 NOTE — Patient Instructions (Addendum)
-Nice seeing you today!!  -Lab work today; will notify you when results are available.  -Mammogram in July.  -GI referral for screening colonoscopy.  -Consider receiving tetanus and shingles vaccine.   Preventive Care 40-64 Years, Female Preventive care refers to lifestyle choices and visits with your health care provider that can promote health and wellness. What does preventive care include?   A yearly physical exam. This is also called an annual well check.  Dental exams once or twice a year.  Routine eye exams. Ask your health care provider how often you should have your eyes checked.  Personal lifestyle choices, including: ? Daily care of your teeth and gums. ? Regular physical activity. ? Eating a healthy diet. ? Avoiding tobacco and drug use. ? Limiting alcohol use. ? Practicing safe sex. ? Taking low-dose aspirin daily starting at age 63. ? Taking vitamin and mineral supplements as recommended by your health care provider. What happens during an annual well check? The services and screenings done by your health care provider during your annual well check will depend on your age, overall health, lifestyle risk factors, and family history of disease. Counseling Your health care provider may ask you questions about your:  Alcohol use.  Tobacco use.  Drug use.  Emotional well-being.  Home and relationship well-being.  Sexual activity.  Eating habits.  Work and work Statistician.  Method of birth control.  Menstrual cycle.  Pregnancy history. Screening You may have the following tests or measurements:  Height, weight, and BMI.  Blood pressure.  Lipid and cholesterol levels. These may be checked every 5 years, or more frequently if you are over 50 years old.  Skin check.  Lung cancer screening. You may have this screening every year starting at age 63 if you have a 30-pack-year history of smoking and currently smoke or have quit within the past 15  years.  Colorectal cancer screening. All adults should have this screening starting at age 63 and continuing until age 66. Your health care provider may recommend screening at age 54. You will have tests every 1-10 years, depending on your results and the type of screening test. People at increased risk should start screening at an earlier age. Screening tests may include: ? Guaiac-based fecal occult blood testing. ? Fecal immunochemical test (FIT). ? Stool DNA test. ? Virtual colonoscopy. ? Sigmoidoscopy. During this test, a flexible tube with a tiny camera (sigmoidoscope) is used to examine your rectum and lower colon. The sigmoidoscope is inserted through your anus into your rectum and lower colon. ? Colonoscopy. During this test, a long, thin, flexible tube with a tiny camera (colonoscope) is used to examine your entire colon and rectum.  Hepatitis C blood test.  Hepatitis B blood test.  Sexually transmitted disease (STD) testing.  Diabetes screening. This is done by checking your blood sugar (glucose) after you have not eaten for a while (fasting). You may have this done every 1-3 years.  Mammogram. This may be done every 1-2 years. Talk to your health care provider about when you should start having regular mammograms. This may depend on whether you have a family history of breast cancer.  BRCA-related cancer screening. This may be done if you have a family history of breast, ovarian, tubal, or peritoneal cancers.  Pelvic exam and Pap test. This may be done every 3 years starting at age 63. Starting at age 63, this may be done every 5 years if you have a Pap test in combination  with an HPV test.  Bone density scan. This is done to screen for osteoporosis. You may have this scan if you are at high risk for osteoporosis. Discuss your test results, treatment options, and if necessary, the need for more tests with your health care provider. Vaccines Your health care provider may  recommend certain vaccines, such as:  Influenza vaccine. This is recommended every year.  Tetanus, diphtheria, and acellular pertussis (Tdap, Td) vaccine. You may need a Td booster every 10 years.  Varicella vaccine. You may need this if you have not been vaccinated.  Zoster vaccine. You may need this after age 63.  Measles, mumps, and rubella (MMR) vaccine. You may need at least one dose of MMR if you were born in 1957 or later. You may also need a second dose.  Pneumococcal 13-valent conjugate (PCV13) vaccine. You may need this if you have certain conditions and were not previously vaccinated.  Pneumococcal polysaccharide (PPSV23) vaccine. You may need one or two doses if you smoke cigarettes or if you have certain conditions.  Meningococcal vaccine. You may need this if you have certain conditions.  Hepatitis A vaccine. You may need this if you have certain conditions or if you travel or work in places where you may be exposed to hepatitis A.  Hepatitis B vaccine. You may need this if you have certain conditions or if you travel or work in places where you may be exposed to hepatitis B.  Haemophilus influenzae type b (Hib) vaccine. You may need this if you have certain conditions. Talk to your health care provider about which screenings and vaccines you need and how often you need them. This information is not intended to replace advice given to you by your health care provider. Make sure you discuss any questions you have with your health care provider. Document Released: 11/25/2015 Document Revised: 12/19/2017 Document Reviewed: 08/30/2015 Elsevier Interactive Patient Education  2019 [employer].

## 2019-04-28 NOTE — Progress Notes (Signed)
Established Patient Office Visit     CC/Reason for Visit: Annual preventive exam, discuss phentermine prescription  HPI: Katelyn [name] is a 63 y.o. female who is coming in today for the above mentioned reasons. Past Medical History is significant for: Hypertension that has been well controlled, hypothyroidism, cluster headaches (which is the main reason why she is on atenolol), history of left knee replacement in 2018.  She has also been on phentermine for years for obesity.  We had a video visitation on June 5 to discuss continued phentermine use and are following up on that today.  She remains very concerned about weight gain that she experiences while off phentermine that she describes is due to her being hypothyroid.  She has routine eye and dental care, she sees GYN Dr. Philis Pique who orders her mammograms and does her Pap smears, her last mammogram was in July 2019, it has been over 12 years since her last colonoscopy, will initiate GI referral today.  We have discussed vaccination today, she is due for tetanus and shingles.   Past Medical/Surgical History: Past Medical History:  Diagnosis Date  . Anemia   . Headache(784.0)    cluster headaches, migraines- states is taking Atenolol at present as directed per Dr Lacinda Axon  to prevent migraine from increased stress-  states if on medication for several weeks that heart rate will drop to 40's  . History of kidney stones   . HYPOTHYROIDISM 05/27/2007  . Pneumonia    post op x 2 last yrs ago  . PONV (postoperative nausea and vomiting)    likes phenergan with anesthesia, has woken up with 2 surgeries per patient   . WEIGHT GAIN 07/15/2009    Past Surgical History:  Procedure Laterality Date  . ABDOMINAL HYSTERECTOMY    . APPENDECTOMY    . CYSTOSCOPY WITH RETROGRADE PYELOGRAM, URETEROSCOPY AND STENT PLACEMENT Right 04/26/2017   Procedure: CYSTOSCOPY WITH RETROGRADE PYELOGRAM, URETEROSCOPY AND STENT PLACEMENT;  Surgeon:  Cleon Gustin, MD;  Location: [facility];  Service: Urology;  Laterality: Right;  . CYSTOSCOPY WITH RETROGRADE PYELOGRAM, URETEROSCOPY AND STENT PLACEMENT Right 08/19/2017   Procedure: CYSTOSCOPY WITH RETROGRADE PYELOGRAM, URETEROSCOPY;  Surgeon: Cleon Gustin, MD;  Location: [facility];  Service: Urology;  Laterality: Right;  . DIAGNOSTIC LAPAROSCOPY    . FOOT SURGERY  2007   tumor removed from right foot/   . HOLMIUM LASER APPLICATION Right 4/48/1856   Procedure: HOLMIUM LASER APPLICATION;  Surgeon: Cleon Gustin, MD;  Location: [facility];  Service: Urology;  Laterality: Right;  . KNEE ARTHROSCOPY Left    x 3  . LAPAROTOMY  10/30/2011   Procedure: EXPLORATORY LAPAROTOMY;  Surgeon: Janie Morning, MD;  Location: [facility];  Service: Gynecology;  Laterality: N/A;  . mortons neuroma removed from foot    . SALPINGOOPHORECTOMY  10/30/2011   Procedure: SALPINGO OOPHERECTOMY;  Surgeon: Janie Morning, MD;  Location: [facility];  Service: Gynecology;  Laterality: Right;  RIGHT  . TONSILLECTOMY    . TOTAL KNEE ARTHROPLASTY Left 09/25/2017   Procedure: LEFT TOTAL KNEE ARTHROPLASTY;  Surgeon: Latanya Maudlin, MD;  Location: [facility];  Service: Orthopedics;  Laterality: Left;  . TUBAL LIGATION    . TYMPANOSTOMY TUBE PLACEMENT      Social History:  reports that she has never smoked. She has never used smokeless tobacco. She reports that she does not drink alcohol or use drugs.  Allergies: Allergies  Allergen Reactions  . Adhesive [Tape] Swelling and  Other (See Comments)    Reaction:  Eats in skin  . Ciprofloxacin Hives and Shortness Of Breath  . Dimethicone Hives and Shortness Of Breath  . Erythromycin Hives and Shortness Of Breath  . Latex Hives and Shortness Of Breath  . Other Hives, Shortness Of Breath and Other (See Comments)    Pt is allergic to nitrile gloves.  Pt is allergic to antibacterial soaps- rash - states she only uses Dove soap  SCDs- patient states - had  itching with this   . Penicillins Hives, Shortness Of Breath and Other (See Comments)    Has patient had a PCN reaction causing immediate rash, facial/tongue/throat swelling, SOB or lightheadedness with hypotension: Yes Has patient had a PCN reaction causing severe rash involving mucus membranes or skin necrosis: No Has patient had a PCN reaction that required hospitalization No Has patient had a PCN reaction occurring within the last 10 years: No If all of the above answers are "NO", then may proceed with Cephalosporin use.  Marland Kitchen Pineapple Anaphylaxis  . Red Dye Hives and Shortness Of Breath  . Shellfish Allergy Hives and Shortness Of Breath  . Sulfonamide Derivatives Hives and Shortness Of Breath  . Alcohol-Sulfur [Sulfur] Swelling and Other (See Comments)    Reaction:  Localized swelling  With alcohol   . Aspirin Nausea And Vomiting  . Doxycycline Nausea And Vomiting  . Hibiclens [Chlorhexidine]     Rash   . Ceftin [Cefuroxime] Rash    Family History:  Family History  Problem Relation Age of Onset  . Alzheimer's disease Mother   . Cervical cancer Mother   . Diabetes Sister   . Heart attack Brother   . Hypertension Neg Hx        family hx  . Heart disease Neg Hx        family hx     Current Outpatient Medications:  .  albuterol (PROVENTIL HFA;VENTOLIN HFA) 108 (90 Base) MCG/ACT inhaler, Inhale 2 puffs into the lungs every 6 (six) hours as needed for wheezing or shortness of breath., Disp: 1 Inhaler, Rfl: 0 .  aspirin EC 325 MG tablet, Take 1 tablet (325 mg total) 2 (two) times daily by mouth., Disp: 30 tablet, Rfl: 0 .  atenolol (TENORMIN) 25 MG tablet, Take 1 tablet (25 mg total) by mouth every morning. For migraines, Disp: 90 tablet, Rfl: 1 .  benzonatate (TESSALON) 200 MG capsule, Take 1 capsule (200 mg total) by mouth 2 (two) times daily as needed for cough., Disp: 40 capsule, Rfl: 0 .  Biotin 1 MG CAPS, Take 1 mg by mouth daily., Disp: , Rfl:  .  cholecalciferol (VITAMIN  D) 1000 UNITS tablet, Take 1,000 Units by mouth daily. , Disp: , Rfl:  .  Cranberry 1000 MG CAPS, Take 1,000 mg by mouth daily., Disp: , Rfl:  .  Cyanocobalamin (VITAMIN B-12) 2500 MCG SUBL, Place 2,500 mcg under the tongue daily. , Disp: , Rfl:  .  levothyroxine (SYNTHROID) 175 MCG tablet, Take 1 tablet (175 mcg total) by mouth daily before breakfast., Disp: 90 tablet, Rfl: 1 .  mirabegron ER (MYRBETRIQ) 25 MG TB24 tablet, Take 25 mg by mouth daily., Disp: , Rfl:  .  ondansetron (ZOFRAN ODT) 4 MG disintegrating tablet, Take 1 tablet (4 mg total) by mouth every 8 (eight) hours as needed for nausea or vomiting., Disp: 30 tablet, Rfl: 0 .  phentermine 30 MG capsule, TAKE 1 CAPSULE BY MOUTH ONCE DAILY, Disp: 90 capsule, Rfl: 0 .  promethazine (PHENERGAN) 25 MG tablet, Take 1 tablet (25 mg total) every 8 (eight) hours as needed by mouth for refractory nausea / vomiting (if response to Zofran not sufficient)., Disp: 30 tablet, Rfl: 0 .  SYNTHROID 175 MCG tablet, TAKE 1 TABLET (175 MCG TOTAL) BY MOUTH DAILY BEFORE BREAKFAST., Disp: 90 tablet, Rfl: 1 .  tamsulosin (FLOMAX) 0.4 MG CAPS capsule, Take 0.4 mg by mouth daily., Disp: , Rfl:   Review of Systems:  Constitutional: Denies fever, chills, diaphoresis, appetite change and fatigue.  HEENT: Denies photophobia, eye pain, redness, hearing loss, ear pain, congestion, sore throat, rhinorrhea, sneezing, mouth sores, trouble swallowing, neck pain, neck stiffness and tinnitus.   Respiratory: Denies SOB, DOE, cough, chest tightness,  and wheezing.   Cardiovascular: Denies chest pain, palpitations and leg swelling.  Gastrointestinal: Denies nausea, vomiting, abdominal pain, diarrhea, constipation, blood in stool and abdominal distention.  Genitourinary: Denies dysuria, urgency, frequency, hematuria, flank pain and difficulty urinating.  Endocrine: Denies: hot or cold intolerance, sweats, changes in hair or nails, polyuria, polydipsia. Musculoskeletal: Denies  myalgias, back pain, joint swelling, arthralgias and gait problem.  Skin: Denies pallor, rash and wound.  Neurological: Denies dizziness, seizures, syncope, weakness, light-headedness, numbness and headaches.  Hematological: Denies adenopathy. Easy bruising, personal or family bleeding history  Psychiatric/Behavioral: Denies suicidal ideation, mood changes, confusion, nervousness, sleep disturbance and agitation    Physical Exam: Vitals:   04/28/19 0708  BP: 120/84  Pulse: 64  Temp: 98.3 F (36.8 C)  TempSrc: Oral  SpO2: 97%  Weight: 188 lb 3.2 oz (85.4 kg)  Height: 5' 5.5" (1.664 m)    Body mass index is 30.84 kg/m.   Constitutional: NAD, calm, comfortable Eyes: PERRL, lids and conjunctivae normal ENMT: Mucous membranes are moist. Posterior pharynx clear of any exudate or lesions. Normal dentition. Neck: normal, supple, no masses, no thyromegaly Respiratory: clear to auscultation bilaterally, no wheezing, no crackles. Normal respiratory effort. No accessory muscle use.  Cardiovascular: Regular rate and rhythm, no murmurs / rubs / gallops. No extremity edema. 2+ pedal pulses. No carotid bruits.  Abdomen: no tenderness, no masses palpated. No hepatosplenomegaly. Bowel sounds positive.  Musculoskeletal: no clubbing / cyanosis. No joint deformity upper and lower extremities. Good ROM, no contractures. Normal muscle tone.  Skin: no rashes, lesions, ulcers. No induration Neurologic: CN 2-12 grossly intact. Sensation intact, DTR normal. Strength 5/5 in all 4.  Psychiatric: Normal judgment and insight. Alert and oriented x 3. Normal mood.    Impression and Plan:  Encounter for preventive health examination  -She has routine eye and dental care. -We have discussed immunizations: She is due for tetanus and shingles, she refuses but states she will think about it. -She has her Pap smear done by GYN, is due for mammogram in July 2020 that is ordered through her [place] office.  GI  referral for screening colonoscopy will be sent today. -We have discussed healthy lifestyle including appropriate diet and amount of physical activity for weight loss and cardiovascular health. -Screening labs to be done today.  Acquired hypothyroidism  -Check TSH. -Refilled Synthroid today.  Essential hypertension -Well-controlled. -Continue atenolol.  Screening for malignant neoplasm of colon - Plan: Ambulatory referral to Gastroenterology  Obesity (BMI 30-39.9) - Plan:  -Discussed healthy lifestyle, including increased physical activity and better food choices to promote weight loss. -Have discussed how phentermine is not FDA approved for use beyond 12 weeks.  We have discussed cardiovascular side effects including hypertension, which she already has a diagnosis of. -I  do not feel comfortable continuing to prescribe this medication at this time. -Have offered referral to the healthy weight and wellness clinic, have explained that it is certainly not guaranteed that they will continue to prescribe phentermine either, however it would be beneficial to be in a monitored weight loss program given her concerns.     Patient Instructions  -Nice seeing you today!!  -Lab work today; will notify you when results are available.  -Mammogram in July.  -GI referral for screening colonoscopy.  -Consider receiving tetanus and shingles vaccine.   Preventive Care 40-64 Years, Female Preventive care refers to lifestyle choices and visits with your health care provider that can promote health and wellness. What does preventive care include?   A yearly physical exam. This is also called an annual well check.  Dental exams once or twice a year.  Routine eye exams. Ask your health care provider how often you should have your eyes checked.  Personal lifestyle choices, including: ? Daily care of your teeth and gums. ? Regular physical activity. ? Eating a healthy diet. ? Avoiding tobacco  and drug use. ? Limiting alcohol use. ? Practicing safe sex. ? Taking low-dose aspirin daily starting at age 68. ? Taking vitamin and mineral supplements as recommended by your health care provider. What happens during an annual well check? The services and screenings done by your health care provider during your annual well check will depend on your age, overall health, lifestyle risk factors, and family history of disease. Counseling Your health care provider may ask you questions about your:  Alcohol use.  Tobacco use.  Drug use.  Emotional well-being.  Home and relationship well-being.  Sexual activity.  Eating habits.  Work and work Statistician.  Method of birth control.  Menstrual cycle.  Pregnancy history. Screening You may have the following tests or measurements:  Height, weight, and BMI.  Blood pressure.  Lipid and cholesterol levels. These may be checked every 5 years, or more frequently if you are over 22 years old.  Skin check.  Lung cancer screening. You may have this screening every year starting at age 60 if you have a 30-pack-year history of smoking and currently smoke or have quit within the past 15 years.  Colorectal cancer screening. All adults should have this screening starting at age 26 and continuing until age 7. Your health care provider may recommend screening at age 51. You will have tests every 1-10 years, depending on your results and the type of screening test. People at increased risk should start screening at an earlier age. Screening tests may include: ? Guaiac-based fecal occult blood testing. ? Fecal immunochemical test (FIT). ? Stool DNA test. ? Virtual colonoscopy. ? Sigmoidoscopy. During this test, a flexible tube with a tiny camera (sigmoidoscope) is used to examine your rectum and lower colon. The sigmoidoscope is inserted through your anus into your rectum and lower colon. ? Colonoscopy. During this test, a long, thin,  flexible tube with a tiny camera (colonoscope) is used to examine your entire colon and rectum.  Hepatitis C blood test.  Hepatitis B blood test.  Sexually transmitted disease (STD) testing.  Diabetes screening. This is done by checking your blood sugar (glucose) after you have not eaten for a while (fasting). You may have this done every 1-3 years.  Mammogram. This may be done every 1-2 years. Talk to your health care provider about when you should start having regular mammograms. This may depend on whether you have a  family history of breast cancer.  BRCA-related cancer screening. This may be done if you have a family history of breast, ovarian, tubal, or peritoneal cancers.  Pelvic exam and Pap test. This may be done every 3 years starting at age 77. Starting at age 80, this may be done every 5 years if you have a Pap test in combination with an HPV test.  Bone density scan. This is done to screen for osteoporosis. You may have this scan if you are at high risk for osteoporosis. Discuss your test results, treatment options, and if necessary, the need for more tests with your health care provider. Vaccines Your health care provider may recommend certain vaccines, such as:  Influenza vaccine. This is recommended every year.  Tetanus, diphtheria, and acellular pertussis (Tdap, Td) vaccine. You may need a Td booster every 10 years.  Varicella vaccine. You may need this if you have not been vaccinated.  Zoster vaccine. You may need this after age 59.  Measles, mumps, and rubella (MMR) vaccine. You may need at least one dose of MMR if you were born in 1957 or later. You may also need a second dose.  Pneumococcal 13-valent conjugate (PCV13) vaccine. You may need this if you have certain conditions and were not previously vaccinated.  Pneumococcal polysaccharide (PPSV23) vaccine. You may need one or two doses if you smoke cigarettes or if you have certain conditions.  Meningococcal  vaccine. You may need this if you have certain conditions.  Hepatitis A vaccine. You may need this if you have certain conditions or if you travel or work in places where you may be exposed to hepatitis A.  Hepatitis B vaccine. You may need this if you have certain conditions or if you travel or work in places where you may be exposed to hepatitis B.  Haemophilus influenzae type b (Hib) vaccine. You may need this if you have certain conditions. Talk to your health care provider about which screenings and vaccines you need and how often you need them. This information is not intended to replace advice given to you by your health care provider. Make sure you discuss any questions you have with your health care provider. Document Released: 11/25/2015 Document Revised: 12/19/2017 Document Reviewed: 08/30/2015 Elsevier Interactive Patient Education  2019 [place], [place] [facility]

## 2019-04-29 ENCOUNTER — Encounter: Payer: Self-pay | Admitting: Internal Medicine

## 2019-04-29 ENCOUNTER — Other Ambulatory Visit: Payer: Self-pay | Admitting: Internal Medicine

## 2019-04-29 DIAGNOSIS — E559 Vitamin D deficiency, unspecified: Secondary | ICD-10-CM

## 2019-04-29 DIAGNOSIS — E785 Hyperlipidemia, unspecified: Secondary | ICD-10-CM | POA: Insufficient documentation

## 2019-04-29 MED ORDER — VITAMIN D (ERGOCALCIFEROL) 1.25 MG (50000 UNIT) PO CAPS: 50000.0000 [IU] | ORAL_CAPSULE | ORAL | 0 refills | Status: AC

## 2019-05-01 ENCOUNTER — Other Ambulatory Visit: Payer: Self-pay | Admitting: *Deleted

## 2019-05-01 ENCOUNTER — Telehealth: Payer: Self-pay | Admitting: Internal Medicine

## 2019-05-01 ENCOUNTER — Telehealth: Payer: Self-pay | Admitting: *Deleted

## 2019-05-01 MED ORDER — LEVOTHYROXINE SODIUM 200 MCG PO TABS: 200.0000 ug | ORAL_TABLET | Freq: Every day | ORAL | 1 refills | Status: DC

## 2019-05-01 NOTE — Telephone Encounter (Signed)
Copied from [place] [id number]. Topic: General - Other >> May 01, 2019  3:55 PM Rainey Pines A wrote: Patient stated that she was told her medication would be changed to 200 mg but when she went to the pharmacy it was still ordered at  SYNTHROID 175 MCG tablet. Patient would like a callback from nurse.

## 2019-05-01 NOTE — Telephone Encounter (Signed)
Katelyn [name] stating they have questions regarding prescription levothyroxine (SYNTHROID) 200 . Pharmacy is wondering if she needs the brand name instead of levothyroxine due to that is what usually the patient does get.  Call back Please call back [phone]

## 2019-05-01 NOTE — Telephone Encounter (Signed)
Refill sent.

## 2019-05-05 NOTE — Telephone Encounter (Signed)
No, generic is fine

## 2019-05-05 NOTE — Telephone Encounter (Signed)
Spoke with pharmacist  

## 2019-08-03 ENCOUNTER — Other Ambulatory Visit: Payer: Self-pay | Admitting: Internal Medicine

## 2019-11-11 ENCOUNTER — Telehealth: Payer: Self-pay | Admitting: *Deleted

## 2019-11-11 NOTE — Telephone Encounter (Signed)
Prior Auth started for :  SYNTHROID 200 MCG tablet  Key: [id number]  Brand name only per patient request

## 2019-11-12 NOTE — Telephone Encounter (Signed)
Prior Auth for Synthroid has been approved through 11/10/2020.

## 2020-02-18 ENCOUNTER — Telehealth: Payer: Self-pay | Admitting: Internal Medicine

## 2020-02-18 NOTE — Telephone Encounter (Signed)
Fine with me. Schedule in next new pt appt that fits her schedule

## 2020-02-18 NOTE — Telephone Encounter (Signed)
Ok with me 

## 2020-02-18 NOTE — Telephone Encounter (Signed)
Pt is requesting to transfer to [facility]. Is this okay to schedule?

## 2020-02-19 NOTE — Telephone Encounter (Signed)
Called to schedule pt for TOC. Lvm asking her to call office

## 2020-03-08 ENCOUNTER — Other Ambulatory Visit: Payer: Self-pay

## 2020-03-08 DIAGNOSIS — N200 Calculus of kidney: Secondary | ICD-10-CM

## 2020-03-25 ENCOUNTER — Ambulatory Visit: Payer: 59 | Admitting: Internal Medicine

## 2020-03-25 ENCOUNTER — Encounter: Payer: Self-pay | Admitting: Internal Medicine

## 2020-03-25 ENCOUNTER — Other Ambulatory Visit: Payer: Self-pay

## 2020-03-25 ENCOUNTER — Ambulatory Visit (INDEPENDENT_AMBULATORY_CARE_PROVIDER_SITE_OTHER)
Admission: RE | Admit: 2020-03-25 | Discharge: 2020-03-25 | Disposition: A | Discharge status: Home / Self Care | Payer: 59 | Source: Ambulatory Visit | Attending: Internal Medicine | Admitting: Internal Medicine

## 2020-03-25 VITALS — BP 122/72 | HR 83 | Temp 96.6°F | Ht 65.5 in | Wt 183.5 lb

## 2020-03-25 DIAGNOSIS — I10 Essential (primary) hypertension: Secondary | ICD-10-CM | POA: Diagnosis not present

## 2020-03-25 DIAGNOSIS — M533 Sacrococcygeal disorders, not elsewhere classified: Secondary | ICD-10-CM

## 2020-03-25 DIAGNOSIS — M25532 Pain in left wrist: Secondary | ICD-10-CM | POA: Diagnosis not present

## 2020-03-25 DIAGNOSIS — M25551 Pain in right hip: Secondary | ICD-10-CM | POA: Diagnosis not present

## 2020-03-25 DIAGNOSIS — N200 Calculus of kidney: Secondary | ICD-10-CM

## 2020-03-25 DIAGNOSIS — E7800 Pure hypercholesterolemia, unspecified: Secondary | ICD-10-CM

## 2020-03-25 DIAGNOSIS — E039 Hypothyroidism, unspecified: Secondary | ICD-10-CM

## 2020-03-25 DIAGNOSIS — R519 Headache, unspecified: Secondary | ICD-10-CM

## 2020-03-25 MED ORDER — PHENTERMINE HCL 30 MG PO CAPS: 30.0000 mg | ORAL_CAPSULE | Freq: Every day | ORAL | 0 refills | Status: DC

## 2020-03-25 MED ORDER — ALBUTEROL SULFATE HFA 108 (90 BASE) MCG/ACT IN AERS: 2.0000 | INHALATION_SPRAY | Freq: Four times a day (QID) | RESPIRATORY_TRACT | 2 refills | Status: DC | PRN

## 2020-03-25 NOTE — Progress Notes (Signed)
HPI  Pt presents to the clinic today to establish care and for management of the conditions listed below. She is transferring care from Dr. Blenda Bridegroom.  Anemia: Her last H/H was 14.4/42.2, 04/2019. She is not taking a iron supplement OTC.  Headaches: Cluster. Managed on Atenolol as needed. She does not follow with neurology or the [facility].  Hypothyroidism: She denies any issues on her current dose of Levothyroxine. She does not follow with endocrinology.  HTN: Her BP today is 122/72. She is taking Atenolol as prescribed. ECG from 09/2017 reviewed.  HLD: Her last LDL was 132, 04/2019. She is not taking any cholesterol lowering medication at this time. She tries to consume a low fat diet.  Kidney Stones:  Managed on Flomax and Myrbetriq. She follows with urology.  She would like a refill of Phentermine today. Her weight today is 183 lbs with a BMI of 30.07.  She also reports left wrist pain, tail bone pain and right hip pain. This started after a fall. She tripped and landed on her buttocks/right side. She descries the pain as sore and achy. The pain is worse with movement. She has not noticed any swelling or bruising.  Flu: never Tetanus: never Ziostavax: never Shingrix: never Covid: never Pap Smear: 2021, [place]: 2021, [place] Bone Density: never Colon Screening: never Vision Screening: annually Dentist: annually  Past Medical History:  Diagnosis Date  . Anemia   . Headache(784.0)    cluster headaches, migraines- states is taking Atenolol at present as directed per Dr Lacinda Axon  to prevent migraine from increased stress-  states if on medication for several weeks that heart rate will drop to 40's  . History of kidney stones   . HYPOTHYROIDISM 05/27/2007  . Pneumonia    post op x 2 last yrs ago  . PONV (postoperative nausea and vomiting)    likes phenergan with anesthesia, has woken up with 2 surgeries per patient   .  WEIGHT GAIN 07/15/2009    Current Outpatient Medications  Medication Sig Dispense Refill  . albuterol (PROVENTIL HFA;VENTOLIN HFA) 108 (90 Base) MCG/ACT inhaler Inhale 2 puffs into the lungs every 6 (six) hours as needed for wheezing or shortness of breath. 1 Inhaler 0  . aspirin EC 325 MG tablet Take 1 tablet (325 mg total) 2 (two) times daily by mouth. 30 tablet 0  . atenolol (TENORMIN) 25 MG tablet Take 1 tablet (25 mg total) by mouth every morning. For migraines 90 tablet 1  . benzonatate (TESSALON) 200 MG capsule Take 1 capsule (200 mg total) by mouth 2 (two) times daily as needed for cough. 40 capsule 0  . Biotin 1 MG CAPS Take 1 mg by mouth daily.    . cholecalciferol (VITAMIN D) 1000 UNITS tablet Take 1,000 Units by mouth daily.     . Cranberry 1000 MG CAPS Take 1,000 mg by mouth daily.    . Cyanocobalamin (VITAMIN B-12) 2500 MCG SUBL Place 2,500 mcg under the tongue daily.     . mirabegron ER (MYRBETRIQ) 25 MG TB24 tablet Take 25 mg by mouth daily.    . ondansetron (ZOFRAN ODT) 4 MG disintegrating tablet Take 1 tablet (4 mg total) by mouth every 8 (eight) hours as needed for nausea or vomiting. 30 tablet 0  . phentermine 30 MG capsule TAKE 1 CAPSULE BY MOUTH ONCE DAILY 90 capsule 0  . promethazine (PHENERGAN) 25 MG tablet Take 1 tablet (25 mg total) every 8 (eight) hours  as needed by mouth for refractory nausea / vomiting (if response to Zofran not sufficient). 30 tablet 0  . SYNTHROID 200 MCG tablet Take 1 tablet (200 mcg total) by mouth daily before breakfast. Patient requests brand name only 90 tablet 0  . tamsulosin (FLOMAX) 0.4 MG CAPS capsule Take 0.4 mg by mouth daily.     No current facility-administered medications for this visit.    Allergies  Allergen Reactions  . Adhesive [Tape] Swelling and Other (See Comments)    Reaction:  Eats in skin  . Ciprofloxacin Hives and Shortness Of Breath  . Dimethicone Hives and Shortness Of Breath  . Erythromycin Hives and Shortness Of  Breath  . Latex Hives and Shortness Of Breath  . Other Hives, Shortness Of Breath and Other (See Comments)    Pt is allergic to nitrile gloves.  Pt is allergic to antibacterial soaps- rash - states she only uses Dove soap  SCDs- patient states - had itching with this   . Penicillins Hives, Shortness Of Breath and Other (See Comments)    Has patient had a PCN reaction causing immediate rash, facial/tongue/throat swelling, SOB or lightheadedness with hypotension: Yes Has patient had a PCN reaction causing severe rash involving mucus membranes or skin necrosis: No Has patient had a PCN reaction that required hospitalization No Has patient had a PCN reaction occurring within the last 10 years: No If all of the above answers are "NO", then may proceed with Cephalosporin use.  Marland Kitchen Pineapple Anaphylaxis  . Red Dye Hives and Shortness Of Breath  . Shellfish Allergy Hives and Shortness Of Breath  . Sulfonamide Derivatives Hives and Shortness Of Breath  . Alcohol-Sulfur [Sulfur] Swelling and Other (See Comments)    Reaction:  Localized swelling  With alcohol   . Aspirin Nausea And Vomiting  . Doxycycline Nausea And Vomiting  . Hibiclens [Chlorhexidine]     Rash   . Ceftin [Cefuroxime] Rash    Family History  Problem Relation Age of Onset  . Alzheimer's disease Mother   . Cervical cancer Mother   . Diabetes Sister   . Heart attack Brother   . Hypertension Neg Hx        family hx  . Heart disease Neg Hx        family hx    Social History   Socioeconomic History  . Marital status: Married    Spouse name: Not on file  . Number of children: Not on file  . Years of education: Not on file  . Highest education level: Not on file  Occupational History  . Not on file  Tobacco Use  . Smoking status: Never Smoker  . Smokeless tobacco: Never Used  Substance and Sexual Activity  . Alcohol use: No  . Drug use: No  . Sexual activity: Yes  Other Topics Concern  . Not on file  Social  History Narrative  . Not on file   Social Determinants of Health   Financial Resource Strain:   . Difficulty of Paying Living Expenses:   Food Insecurity:   . Worried About Charity fundraiser in the Last Year:   . Arboriculturist in the Last Year:   Transportation Needs:   . Film/video editor (Medical):   Marland Kitchen Lack of Transportation (Non-Medical):   Physical Activity:   . Days of Exercise per Week:   . Minutes of Exercise per Session:   Stress:   . Feeling of Stress :  Social Connections:   . Frequency of Communication with Friends and Family:   . Frequency of Social Gatherings with Friends and Family:   . Attends Religious Services:   . Active Member of Clubs or Organizations:   . Attends Archivist Meetings:   Marland Kitchen Marital Status:   Intimate Partner Violence:   . Fear of Current or Ex-Partner:   . Emotionally Abused:   Marland Kitchen Physically Abused:   . Sexually Abused:     ROS:  Constitutional: Pt reports intermittent headaches. Denies fever, malaise, fatigue, or abrupt weight changes.  HEENT: Denies eye pain, eye redness, ear pain, ringing in the ears, wax buildup, runny nose, nasal congestion, bloody nose, or sore throat. Respiratory: Denies difficulty breathing, shortness of breath, cough or sputum production.   Cardiovascular: Denies chest pain, chest tightness, palpitations or swelling in the hands or feet.  Gastrointestinal: Denies abdominal pain, bloating, constipation, diarrhea or blood in the stool.  GU: Denies frequency, urgency, pain with urination, blood in urine, odor or discharge. Musculoskeletal: Pt reports left wrist pain, tailbone pain and right hip pain. Denies decrease in range of motion, difficulty with gait, muscle pain or joint swelling.  Skin: Denies redness, rashes, lesions or ulcercations.  Neurological: Denies dizziness, difficulty with memory, difficulty with speech or problems with balance and coordination.  Psych: Denies anxiety, depression,  SI/HI.  No other specific complaints in a complete review of systems (except as listed in HPI above).  PE:  BP 122/72   Pulse 83   Temp (!) 96.6 F (35.9 C) (Temporal)   Ht 5' 5.5" (1.664 m)   Wt 183 lb 8 oz (83.2 kg)   SpO2 97%   BMI 30.07 kg/m   Wt Readings from Last 3 Encounters:  04/28/19 188 lb 3.2 oz (85.4 kg)  11/18/18 174 lb 8 oz (79.2 kg)  10/14/18 178 lb 6.4 oz (80.9 kg)    General: Appears her stated age, obese, in NAD. HEENT: Head: normal shape and size; Eyes: sclera white, no icterus, conjunctiva pink, PERRLA and EOMs intact;  Cardiovascular: Normal rate. Pulmonary/Chest: Normal effort and positive vesicular breath sounds.  Musculoskeletal: Normal flexion, extension and rotation of the left wrist. She has an indented area over the distal ulna. Strength 5/5 BUE/BLE. Normal flexion, extension and rotation of the spine. Bony tenderness noted over the sacrum. Normal flexion, extension, internal and external rotation of the right hip. Pain with palpation over the trochanteric bursa on the right. Strength 5/5 BLE. Using cane for assistance with gait. Neurological: Alert and oriented.  Psychiatric: Mood and affect normal. Behavior is normal. Judgment and thought content normal.     BMET    Component Value Date/Time   NA 140 04/28/2019 0802   K 4.6 04/28/2019 0802   CL 105 04/28/2019 0802   CO2 29 04/28/2019 0802   GLUCOSE 91 04/28/2019 0802   BUN 9 04/28/2019 0802   CREATININE 0.77 04/28/2019 0802   CALCIUM 9.2 04/28/2019 0802   GFRNONAA 69 06/18/2018 1707   GFRAA 80 06/18/2018 1707    Lipid Panel     Component Value Date/Time   CHOL 190 04/28/2019 0802   CHOL 222 (H) 06/18/2018 1707   TRIG 214.0 (H) 04/28/2019 0802   HDL 34.70 (L) 04/28/2019 0802   HDL 39 (L) 06/18/2018 1707   CHOLHDL 5 04/28/2019 0802   VLDL 42.8 (H) 04/28/2019 0802   LDLCALC 148 (H) 06/18/2018 1707    CBC    Component Value Date/Time   WBC  7.7 04/28/2019 0802   RBC 4.94  04/28/2019 0802   HGB 14.1 04/28/2019 0802   HCT 42.2 04/28/2019 0802   PLT 347.0 04/28/2019 0802   MCV 85.5 04/28/2019 0802   MCH 28.8 09/29/2017 0446   MCHC 33.3 04/28/2019 0802   RDW 13.3 04/28/2019 0802   LYMPHSABS 2.2 04/28/2019 0802   MONOABS 0.6 04/28/2019 0802   EOSABS 0.3 04/28/2019 0802   BASOSABS 0.0 04/28/2019 0802    Hgb A1C Lab Results  Component Value Date   HGBA1C 5.7 04/28/2019     Assessment and Plan:  Left Wrist Pain, Tailbone Pain, Right Hip Pain s/p Fall:  Will obtain xray of left wrist, sacrum and right hip Encouraged RICE therapy  Abnormal Weight Gain:  Will refill Phentermine. She will need to come of when BMI 29 Encouraged low carb diet, exercise for weight loss RTC in 1 month for your annual exam  Webb Silversmith, NP This visit occurred during the SARS-CoV-2 public health emergency.  Safety protocols were in place, including screening questions prior to the visit, additional usage of staff PPE, and extensive cleaning of exam room while observing appropriate contact time as indicated for disinfecting solutions.

## 2020-03-26 DIAGNOSIS — R519 Headache, unspecified: Secondary | ICD-10-CM | POA: Insufficient documentation

## 2020-03-26 NOTE — Assessment & Plan Note (Signed)
Will check CMET and Lipid profile at annual exam Encouraged her to consume a low fat diet

## 2020-03-26 NOTE — Assessment & Plan Note (Signed)
Will check TSH and Free T4 with annual exam Continue Levothyroxine for now

## 2020-03-26 NOTE — Assessment & Plan Note (Signed)
Controlled off meds  Will monitor 

## 2020-03-26 NOTE — Assessment & Plan Note (Signed)
Continue Flomax and Myrbetiq She will continue to follow with urology

## 2020-03-26 NOTE — Patient Instructions (Signed)
RICE Therapy for Routine Care of Injuries Many injuries can be cared for with rest, ice, compression, and elevation (RICE therapy). This includes:  Resting the injured part.  Putting ice on the injury.  Putting pressure (compression) on the injury.  Raising the injured part (elevation). Using RICE therapy can help to lessen pain and swelling. Supplies needed:  Ice.  Plastic bag.  Towel.  Elastic bandage.  Pillow or pillows to raise (elevate) your injured body part. How to care for your injury with RICE therapy Rest Limit your normal activities, and try not to use the injured part of your body. You can go back to your normal activities when your doctor says it is okay to do them and you feel okay. Ask your doctor if you should do exercises to help your injury get better. Ice Put ice on the injured area. Do not put ice on your bare skin.  Put ice in a plastic bag.  Place a towel between your skin and the bag.  Leave the ice on for 20 minutes, 2-3 times a day. Use ice on as many days as told by your doctor.  Compression Compression means putting pressure on the injured area. This can be done with an elastic bandage. If an elastic bandage has been put on your injury:  Do not wrap the bandage too tight. Wrap the bandage more loosely if part of your body away from the bandage is blue, swollen, cold, painful, or loses feeling (gets numb).  Take off the bandage and put it on again. Do this every 3-4 hours or as told by your doctor.  See your doctor if the bandage seems to make your problems worse.  Elevation Elevation means keeping the injured area raised. If you can, raise the injured area above your heart or the center of your chest. Contact a doctor if:  You keep having pain and swelling.  Your symptoms get worse. Get help right away if:  You have sudden bad pain at your injury or lower than your injury.  You have redness or more swelling around your injury.  You  have tingling or numbness at your injury or lower than your injury, and it does not go away when you take off the bandage. Summary  Many injuries can be cared for using rest, ice, compression, and elevation (RICE therapy).  You can go back to your normal activities when you feel okay and your doctor says it is okay.  Put ice on the injured area as told by your doctor.  Get help if your symptoms get worse or if you keep having pain and swelling. This information is not intended to replace advice given to you by your health care provider. Make sure you discuss any questions you have with your health care provider. Document Revised: 07/19/2017 Document Reviewed: 07/19/2017 Elsevier Patient Education  2020 [employer]  

## 2020-03-26 NOTE — Assessment & Plan Note (Signed)
Continue Atenolol as needed Will monitor

## 2020-04-29 ENCOUNTER — Encounter: Payer: Self-pay | Admitting: Internal Medicine

## 2020-04-29 ENCOUNTER — Other Ambulatory Visit: Payer: Self-pay

## 2020-04-29 ENCOUNTER — Ambulatory Visit (INDEPENDENT_AMBULATORY_CARE_PROVIDER_SITE_OTHER): Payer: 59 | Admitting: Internal Medicine

## 2020-04-29 VITALS — BP 134/78 | HR 75 | Temp 98.2°F | Ht 64.5 in | Wt 175.0 lb

## 2020-04-29 DIAGNOSIS — Z114 Encounter for screening for human immunodeficiency virus [HIV]: Secondary | ICD-10-CM | POA: Diagnosis not present

## 2020-04-29 DIAGNOSIS — Z0000 Encounter for general adult medical examination without abnormal findings: Secondary | ICD-10-CM

## 2020-04-29 DIAGNOSIS — Z1159 Encounter for screening for other viral diseases: Secondary | ICD-10-CM | POA: Diagnosis not present

## 2020-04-29 DIAGNOSIS — E039 Hypothyroidism, unspecified: Secondary | ICD-10-CM

## 2020-04-29 DIAGNOSIS — Z1152 Encounter for screening for COVID-19: Secondary | ICD-10-CM

## 2020-04-29 DIAGNOSIS — R635 Abnormal weight gain: Secondary | ICD-10-CM | POA: Diagnosis not present

## 2020-04-29 MED ORDER — PHENTERMINE HCL 30 MG PO CAPS: 30.0000 mg | ORAL_CAPSULE | Freq: Every day | ORAL | 0 refills | Status: DC

## 2020-04-29 NOTE — Patient Instructions (Signed)

## 2020-04-29 NOTE — Addendum Note (Signed)
Addended by: Ellamae Sia on: 04/29/2020 04:54 PM   Modules accepted: Orders

## 2020-04-29 NOTE — Progress Notes (Signed)
Subjective:    Patient ID: Katelyn [name], female    DOB: 03-Nov-1956, 64 y.o.   MRN: [id number]  HPI  Patient presents to the clinic today for her annual exam.  Flu: never Tetanus: never Covid: never Zostavax: never Shingrix: never Pap smear: 2021, [place]: 2021, [place] Bone Density: never Colon Screening: never Vision screening: annually Dentist: annually  Diet: She does eat meat. She does eat fruits and veggies daily. She occassionally eats fried foods. She drinks mostly water and Dr. Corlis Leak. Exercise: None  Review of Systems      Past Medical History:  Diagnosis Date  . Anemia   . Headache(784.0)    cluster headaches, migraines- states is taking Atenolol at present as directed per Dr Lacinda Axon  to prevent migraine from increased stress-  states if on medication for several weeks that heart rate will drop to 40's  . History of kidney stones   . HYPOTHYROIDISM 05/27/2007  . Pneumonia    post op x 2 last yrs ago  . PONV (postoperative nausea and vomiting)    likes phenergan with anesthesia, has woken up with 2 surgeries per patient   . WEIGHT GAIN 07/15/2009    Current Outpatient Medications  Medication Sig Dispense Refill  . albuterol (VENTOLIN HFA) 108 (90 Base) MCG/ACT inhaler Inhale 2 puffs into the lungs every 6 (six) hours as needed for wheezing or shortness of breath. 18 g 2  . aspirin EC 325 MG tablet Take 1 tablet (325 mg total) 2 (two) times daily by mouth. 30 tablet 0  . atenolol (TENORMIN) 25 MG tablet Take 1 tablet (25 mg total) by mouth every morning. For migraines 90 tablet 1  . Biotin 1 MG CAPS Take 1 mg by mouth daily.    . cholecalciferol (VITAMIN D) 1000 UNITS tablet Take 1,000 Units by mouth daily.     . Cranberry 1000 MG CAPS Take 1,000 mg by mouth daily.    . Cyanocobalamin (VITAMIN B-12) 2500 MCG SUBL Place 2,500 mcg under the tongue daily.     . mirabegron ER (MYRBETRIQ) 25 MG TB24 tablet Take 25 mg by mouth  daily.    . phentermine 30 MG capsule Take 1 capsule (30 mg total) by mouth daily. 30 capsule 0  . promethazine (PHENERGAN) 25 MG tablet Take 1 tablet (25 mg total) every 8 (eight) hours as needed by mouth for refractory nausea / vomiting (if response to Zofran not sufficient). 30 tablet 0  . SYNTHROID 200 MCG tablet Take 1 tablet (200 mcg total) by mouth daily before breakfast. Patient requests brand name only 90 tablet 0  . tamsulosin (FLOMAX) 0.4 MG CAPS capsule Take 0.4 mg by mouth daily.     No current facility-administered medications for this visit.    Allergies  Allergen Reactions  . Adhesive [Tape] Swelling and Other (See Comments)    Reaction:  Eats in skin  . Ciprofloxacin Hives and Shortness Of Breath  . Dimethicone Hives and Shortness Of Breath  . Erythromycin Hives and Shortness Of Breath  . Latex Hives and Shortness Of Breath  . Other Hives, Shortness Of Breath and Other (See Comments)    Pt is allergic to nitrile gloves.  Pt is allergic to antibacterial soaps- rash - states she only uses Dove soap  SCDs- patient states - had itching with this   . Penicillins Hives, Shortness Of Breath and Other (See Comments)    Has patient had a PCN reaction causing  immediate rash, facial/tongue/throat swelling, SOB or lightheadedness with hypotension: Yes Has patient had a PCN reaction causing severe rash involving mucus membranes or skin necrosis: No Has patient had a PCN reaction that required hospitalization No Has patient had a PCN reaction occurring within the last 10 years: No If all of the above answers are "NO", then may proceed with Cephalosporin use.  Marland Kitchen Pineapple Anaphylaxis  . Red Dye Hives and Shortness Of Breath  . Shellfish Allergy Hives and Shortness Of Breath  . Sulfonamide Derivatives Hives and Shortness Of Breath  . Alcohol-Sulfur [Sulfur] Swelling and Other (See Comments)    Reaction:  Localized swelling  With alcohol   . Aspirin Nausea And Vomiting  .  Doxycycline Nausea And Vomiting  . Hibiclens [Chlorhexidine]     Rash   . Ceftin [Cefuroxime] Rash    Family History  Problem Relation Age of Onset  . Alzheimer's disease Mother   . Cervical cancer Mother   . Diabetes Sister   . Heart attack Brother   . Hypertension Neg Hx        family hx  . Heart disease Neg Hx        family hx    Social History   Socioeconomic History  . Marital status: Married    Spouse name: Not on file  . Number of children: Not on file  . Years of education: Not on file  . Highest education level: Not on file  Occupational History  . Not on file  Tobacco Use  . Smoking status: Never Smoker  . Smokeless tobacco: Never Used  Vaping Use  . Vaping Use: Never used  Substance and Sexual Activity  . Alcohol use: No  . Drug use: No  . Sexual activity: Yes  Other Topics Concern  . Not on file  Social History Narrative  . Not on file   Social Determinants of Health   Financial Resource Strain:   . Difficulty of Paying Living Expenses:   Food Insecurity:   . Worried About Charity fundraiser in the Last Year:   . Arboriculturist in the Last Year:   Transportation Needs:   . Film/video editor (Medical):   Marland Kitchen Lack of Transportation (Non-Medical):   Physical Activity:   . Days of Exercise per Week:   . Minutes of Exercise per Session:   Stress:   . Feeling of Stress :   Social Connections:   . Frequency of Communication with Friends and Family:   . Frequency of Social Gatherings with Friends and Family:   . Attends Religious Services:   . Active Member of Clubs or Organizations:   . Attends Archivist Meetings:   Marland Kitchen Marital Status:   Intimate Partner Violence:   . Fear of Current or Ex-Partner:   . Emotionally Abused:   Marland Kitchen Physically Abused:   . Sexually Abused:      Constitutional: Denies fever, malaise, fatigue, headache or abrupt weight changes.  HEENT: Denies eye pain, eye redness, ear pain, ringing in the ears, wax  buildup, runny nose, nasal congestion, bloody nose, or sore throat. Respiratory: Denies difficulty breathing, shortness of breath, cough or sputum production.   Cardiovascular: Denies chest pain, chest tightness, palpitations or swelling in the hands or feet.  Gastrointestinal: Denies abdominal pain, bloating, constipation, diarrhea or blood in the stool.  GU: Denies urgency, frequency, pain with urination, burning sensation, blood in urine, odor or discharge. Musculoskeletal: Denies decrease in range of motion,  difficulty with gait, muscle pain or joint pain and swelling.  Skin: Denies redness, rashes, lesions or ulcercations.  Neurological: Denies dizziness, difficulty with memory, difficulty with speech or problems with balance and coordination.  Psych: Denies anxiety, depression, SI/HI.  No other specific complaints in a complete review of systems (except as listed in HPI above).  Objective:   Physical Exam  BP 134/78   Pulse 75   Temp 98.2 F (36.8 C) (Temporal)   Ht 5' 4.5" (1.638 m)   Wt 175 lb (79.4 kg)   SpO2 98%   BMI 29.57 kg/m   Wt Readings from Last 3 Encounters:  03/25/20 183 lb 8 oz (83.2 kg)  04/28/19 188 lb 3.2 oz (85.4 kg)  11/18/18 174 lb 8 oz (79.2 kg)    General: Appears her stated age, well developed, well nourished in NAD. Skin: Warm, dry and intact. No rashesnoted. HEENT: Head: normal shape and size; Eyes: sclera white, no icterus, conjunctiva pink, PERRLA and EOMs intact; Ears: Tm's gray and intact, normal light reflex;  Neck:  Neck supple, trachea midline. No masses, lumps or thyromegaly present.  Cardiovascular: Normal rate and rhythm. S1,S2 noted.  No murmur, rubs or gallops noted. No JVD or BLE edema. No carotid bruits noted. Pulmonary/Chest: Normal effort and positive vesicular breath sounds. No respiratory distress. No wheezes, rales or ronchi noted.  Abdomen: Soft and nontender. Normal bowel sounds. No distention or masses noted. Liver, spleen and  kidneys non palpable. Musculoskeletal: Strength 5/5 BUE/BLE. No difficulty with gait.  Neurological: Alert and oriented. Cranial nerves II-XII grossly intact. Coordination normal.  Psychiatric: Mood and affect normal. Behavior is normal. Judgment and thought content normal.    BMET    Component Value Date/Time   NA 140 04/28/2019 0802   K 4.6 04/28/2019 0802   CL 105 04/28/2019 0802   CO2 29 04/28/2019 0802   GLUCOSE 91 04/28/2019 0802   BUN 9 04/28/2019 0802   CREATININE 0.77 04/28/2019 0802   CALCIUM 9.2 04/28/2019 0802   GFRNONAA 69 06/18/2018 1707   GFRAA 80 06/18/2018 1707    Lipid Panel     Component Value Date/Time   CHOL 190 04/28/2019 0802   CHOL 222 (H) 06/18/2018 1707   TRIG 214.0 (H) 04/28/2019 0802   HDL 34.70 (L) 04/28/2019 0802   HDL 39 (L) 06/18/2018 1707   CHOLHDL 5 04/28/2019 0802   VLDL 42.8 (H) 04/28/2019 0802   LDLCALC 148 (H) 06/18/2018 1707    CBC    Component Value Date/Time   WBC 7.7 04/28/2019 0802   RBC 4.94 04/28/2019 0802   HGB 14.1 04/28/2019 0802   HCT 42.2 04/28/2019 0802   PLT 347.0 04/28/2019 0802   MCV 85.5 04/28/2019 0802   MCH 28.8 09/29/2017 0446   MCHC 33.3 04/28/2019 0802   RDW 13.3 04/28/2019 0802   LYMPHSABS 2.2 04/28/2019 0802   MONOABS 0.6 04/28/2019 0802   EOSABS 0.3 04/28/2019 0802   BASOSABS 0.0 04/28/2019 0802    Hgb A1C Lab Results  Component Value Date   HGBA1C 5.7 04/28/2019           Assessment & Plan:   Preventative Health Maintenance:  Encouraged her to get a flu shot in the fall She declines tetanus today She declines covid vaccine She declines shingles vaccine Pap smear UTD Mammogram UTD She declines colon cancer screening Bone density ordered Encouraged her to consume a balanced diet and exercise regimen Advised her to see an eye doctor and dentist  annually Will check CBC, CEMT, TSH, Free T4 , B12, Lipid, A1C, Hep C, HIV and Vit D today  Abnormal Weight Gain:  Phentermine refilled  today  RTC in 1 month for weight check/med refill    Webb Silversmith, NP This visit occurred during the SARS-CoV-2 public health emergency.  Safety protocols were in place, including screening questions prior to the visit, additional usage of staff PPE, and extensive cleaning of exam room while observing appropriate contact time as indicated for disinfecting solutions.

## 2020-04-30 LAB — HEPATITIS C ANTIBODY: Hep C Virus Ab: <0.1 s/co ratio (ref 0.0–0.9)

## 2020-04-30 LAB — HEMOGLOBIN A1C
Est. average glucose Bld gHb Est-mCnc: 114 mg/dL
Hgb A1c MFr Bld: 5.6 % (ref 4.8–5.6)

## 2020-04-30 LAB — T4, FREE: Free T4: 2.7 ng/dL — ABNORMAL HIGH (ref 0.82–1.77)

## 2020-04-30 LAB — LIPID PANEL
Chol/HDL Ratio: 4.2 ratio (ref 0.0–4.4)
Cholesterol, Total: 144 mg/dL (ref 100–199)
HDL: 34 mg/dL — ABNORMAL LOW (ref >39)
LDL Chol Calc (NIH): 90 mg/dL (ref 0–99)
Triglycerides: 106 mg/dL (ref 0–149)
VLDL Cholesterol Cal: 20 mg/dL (ref 5–40)

## 2020-04-30 LAB — COMPREHENSIVE METABOLIC PANEL
ALT: 18 IU/L (ref 0–32)
AST: 16 IU/L (ref 0–40)
Albumin/Globulin Ratio: 1.6 (ref 1.2–2.2)
Albumin: 4.3 g/dL (ref 3.8–4.8)
Alkaline Phosphatase: 107 IU/L (ref 48–121)
BUN/Creatinine Ratio: 13 (ref 12–28)
BUN: 9 mg/dL (ref 8–27)
Bilirubin Total: 0.6 mg/dL (ref 0.0–1.2)
CO2: 27 mmol/L (ref 20–29)
Calcium: 9.2 mg/dL (ref 8.7–10.3)
Chloride: 101 mmol/L (ref 96–106)
Creatinine, Ser: 0.69 mg/dL (ref 0.57–1.00)
GFR calc Af Amer: 107 mL/min/{1.73_m2} (ref >59)
GFR calc non Af Amer: 93 mL/min/{1.73_m2} (ref >59)
Globulin, Total: 2.7 g/dL (ref 1.5–4.5)
Glucose: 88 mg/dL (ref 65–99)
Potassium: 3.3 mmol/L — ABNORMAL LOW (ref 3.5–5.2)
Sodium: 143 mmol/L (ref 134–144)
Total Protein: 7 g/dL (ref 6.0–8.5)

## 2020-04-30 LAB — CBC
Hematocrit: 40 % (ref 34.0–46.6)
Hemoglobin: 13.1 g/dL (ref 11.1–15.9)
MCH: 27 pg (ref 26.6–33.0)
MCHC: 32.8 g/dL (ref 31.5–35.7)
MCV: 83 fL (ref 79–97)
Platelets: 298 10*3/uL (ref 150–450)
RBC: 4.85 x10E6/uL (ref 3.77–5.28)
RDW: 13.1 % (ref 11.7–15.4)
WBC: 8.3 10*3/uL (ref 3.4–10.8)

## 2020-04-30 LAB — VITAMIN D 25 HYDROXY (VIT D DEFICIENCY, FRACTURES): Vit D, 25-Hydroxy: 31.8 ng/mL (ref 30.0–100.0)

## 2020-04-30 LAB — SAR COV2 SEROLOGY (COVID19)AB(IGG),IA: DiaSorin SARS-CoV-2 Ab, IgG: NEGATIVE

## 2020-04-30 LAB — VITAMIN B12: Vitamin B-12: 443 pg/mL (ref 232–1245)

## 2020-04-30 LAB — HIV ANTIBODY (ROUTINE TESTING W REFLEX): HIV Screen 4th Generation wRfx: NONREACTIVE

## 2020-04-30 LAB — TSH: TSH: <0.005 u[IU]/mL — ABNORMAL LOW (ref 0.450–4.500)

## 2020-05-03 ENCOUNTER — Encounter: Payer: Self-pay | Admitting: Internal Medicine

## 2020-05-03 DIAGNOSIS — E039 Hypothyroidism, unspecified: Secondary | ICD-10-CM

## 2020-05-03 MED ORDER — LEVOTHYROXINE SODIUM 175 MCG PO TABS: 175.0000 ug | ORAL_TABLET | Freq: Every day | ORAL | 0 refills | Status: DC

## 2020-05-31 ENCOUNTER — Other Ambulatory Visit: Payer: Self-pay | Admitting: Internal Medicine

## 2020-05-31 DIAGNOSIS — E039 Hypothyroidism, unspecified: Secondary | ICD-10-CM

## 2020-06-03 ENCOUNTER — Encounter: Payer: Self-pay | Admitting: Internal Medicine

## 2020-06-03 ENCOUNTER — Ambulatory Visit: Payer: 59 | Admitting: Internal Medicine

## 2020-06-03 ENCOUNTER — Other Ambulatory Visit: Payer: Self-pay

## 2020-06-03 VITALS — BP 122/76 | HR 72 | Temp 97.5°F | Ht 64.5 in | Wt 172.0 lb

## 2020-06-03 DIAGNOSIS — E039 Hypothyroidism, unspecified: Secondary | ICD-10-CM | POA: Diagnosis not present

## 2020-06-03 DIAGNOSIS — R635 Abnormal weight gain: Secondary | ICD-10-CM | POA: Diagnosis not present

## 2020-06-03 MED ORDER — PHENTERMINE HCL 15 MG PO CAPS
15.0000 mg | ORAL_CAPSULE | ORAL | 0 refills | Status: DC
Start: 2020-06-03 — End: 2020-07-01

## 2020-06-03 NOTE — Progress Notes (Signed)
Subjective:    Patient ID: Katelyn [name], female    DOB: 05/12/1956, 64 y.o.   MRN: [id number]  HPI  Patient presents the clinic today for 1 month follow-up for weight check and med refill.  At her last visit she was started on phentermine.  She has been taking the medication as prescribed and denies adverse side effects.  Her starting weight was 175 pounds with a BMI of 30.07.  Her weight today is 172 lbs with a BMI of 29.07.  Breakfast: she does not eat breakfast Lunch: fruit Dinner: fruit, veggies, baked or air fried meat Snacks: chips Exercise: Walking  She is also due to repeat thyroid levels. Her Levothyroxine was reduced to 175 mcg at her last visit.   Review of Systems      Past Medical History:  Diagnosis Date  . Anemia   . Headache(784.0)    cluster headaches, migraines- states is taking Atenolol at present as directed per Dr Lacinda Axon  to prevent migraine from increased stress-  states if on medication for several weeks that heart rate will drop to 40's  . History of kidney stones   . HYPOTHYROIDISM 05/27/2007  . Pneumonia    post op x 2 last yrs ago  . PONV (postoperative nausea and vomiting)    likes phenergan with anesthesia, has woken up with 2 surgeries per patient   . WEIGHT GAIN 07/15/2009    Current Outpatient Medications  Medication Sig Dispense Refill  . albuterol (VENTOLIN HFA) 108 (90 Base) MCG/ACT inhaler Inhale 2 puffs into the lungs every 6 (six) hours as needed for wheezing or shortness of breath. 18 g 2  . atenolol (TENORMIN) 25 MG tablet Take 1 tablet (25 mg total) by mouth every morning. For migraines 90 tablet 1  . Biotin 1 MG CAPS Take 1 mg by mouth daily.    . cholecalciferol (VITAMIN D) 1000 UNITS tablet Take 1,000 Units by mouth daily.     . Cranberry 1000 MG CAPS Take 1,000 mg by mouth daily.    . Cyanocobalamin (VITAMIN B-12) 2500 MCG SUBL Place 2,500 mcg under the tongue daily.     . mirabegron ER (MYRBETRIQ) 25 MG TB24 tablet Take  25 mg by mouth daily.    . phentermine 30 MG capsule Take 1 capsule (30 mg total) by mouth daily. 30 capsule 0  . promethazine (PHENERGAN) 25 MG tablet Take 1 tablet (25 mg total) every 8 (eight) hours as needed by mouth for refractory nausea / vomiting (if response to Zofran not sufficient). 30 tablet 0  . SYNTHROID 175 MCG tablet TAKE 1 TABLET BY MOUTH ONCE A DAY BEFOREBREAKFAST 30 tablet 0  . tamsulosin (FLOMAX) 0.4 MG CAPS capsule Take 0.4 mg by mouth daily.     No current facility-administered medications for this visit.    Allergies  Allergen Reactions  . Adhesive [Tape] Swelling and Other (See Comments)    Reaction:  Eats in skin  . Ciprofloxacin Hives and Shortness Of Breath  . Dimethicone Hives and Shortness Of Breath  . Erythromycin Hives and Shortness Of Breath  . Latex Hives and Shortness Of Breath  . Other Hives, Shortness Of Breath and Other (See Comments)    Pt is allergic to nitrile gloves.  Pt is allergic to antibacterial soaps- rash - states she only uses Dove soap  SCDs- patient states - had itching with this   . Penicillins Hives, Shortness Of Breath and Other (See Comments)    Has  patient had a PCN reaction causing immediate rash, facial/tongue/throat swelling, SOB or lightheadedness with hypotension: Yes Has patient had a PCN reaction causing severe rash involving mucus membranes or skin necrosis: No Has patient had a PCN reaction that required hospitalization No Has patient had a PCN reaction occurring within the last 10 years: No If all of the above answers are "NO", then may proceed with Cephalosporin use.  Marland Kitchen Pineapple Anaphylaxis  . Red Dye Hives and Shortness Of Breath  . Shellfish Allergy Hives and Shortness Of Breath  . Sulfonamide Derivatives Hives and Shortness Of Breath  . Alcohol-Sulfur [Sulfur] Swelling and Other (See Comments)    Reaction:  Localized swelling  With alcohol   . Aspirin Nausea And Vomiting  . Doxycycline Nausea And Vomiting  .  Hibiclens [Chlorhexidine]     Rash   . Ceftin [Cefuroxime] Rash    Family History  Problem Relation Age of Onset  . Alzheimer's disease Mother   . Cervical cancer Mother   . Diabetes Sister   . Heart attack Brother   . Hypertension Neg Hx        family hx  . Heart disease Neg Hx        family hx    Social History   Socioeconomic History  . Marital status: Married    Spouse name: Not on file  . Number of children: Not on file  . Years of education: Not on file  . Highest education level: Not on file  Occupational History  . Not on file  Tobacco Use  . Smoking status: Never Smoker  . Smokeless tobacco: Never Used  Vaping Use  . Vaping Use: Never used  Substance and Sexual Activity  . Alcohol use: No  . Drug use: No  . Sexual activity: Yes  Other Topics Concern  . Not on file  Social History Narrative  . Not on file   Social Determinants of Health   Financial Resource Strain:   . Difficulty of Paying Living Expenses:   Food Insecurity:   . Worried About Charity fundraiser in the Last Year:   . Arboriculturist in the Last Year:   Transportation Needs:   . Film/video editor (Medical):   Marland Kitchen Lack of Transportation (Non-Medical):   Physical Activity:   . Days of Exercise per Week:   . Minutes of Exercise per Session:   Stress:   . Feeling of Stress :   Social Connections:   . Frequency of Communication with Friends and Family:   . Frequency of Social Gatherings with Friends and Family:   . Attends Religious Services:   . Active Member of Clubs or Organizations:   . Attends Archivist Meetings:   Marland Kitchen Marital Status:   Intimate Partner Violence:   . Fear of Current or Ex-Partner:   . Emotionally Abused:   Marland Kitchen Physically Abused:   . Sexually Abused:      Constitutional: Denies fever, malaise, fatigue, headache or abrupt weight changes.  Respiratory: Denies difficulty breathing, shortness of breath, cough or sputum production.   Cardiovascular:  Denies chest pain, chest tightness, palpitations or swelling in the hands or feet.  Neurological: Denies dizziness, difficulty with memory, difficulty with speech or problems with balance and coordination.    No other specific complaints in a complete review of systems (except as listed in HPI above).  Objective:   Physical Exam  BP 122/76   Pulse 72   Temp (!) 97.5  F (36.4 C) (Temporal)   Ht 5' 4.5" (1.638 m)   Wt 172 lb (78 kg)   SpO2 98%   BMI 29.07 kg/m   Wt Readings from Last 3 Encounters:  04/29/20 175 lb (79.4 kg)  03/25/20 183 lb 8 oz (83.2 kg)  04/28/19 188 lb 3.2 oz (85.4 kg)    General: Appears her stated age, well developed, well nourished in NAD. Cardiovascular: Normal rate and rhythm. S1,S2 noted.  No murmur, rubs or gallops noted.  Pulmonary/Chest: Normal effort and positive vesicular breath sounds. Neurological: Alert and oriented.   BMET    Component Value Date/Time   NA 143 04/29/2020 1641   K 3.3 (L) 04/29/2020 1641   CL 101 04/29/2020 1641   CO2 27 04/29/2020 1641   GLUCOSE 88 04/29/2020 1641   GLUCOSE 91 04/28/2019 0802   BUN 9 04/29/2020 1641   CREATININE 0.69 04/29/2020 1641   CALCIUM 9.2 04/29/2020 1641   GFRNONAA 93 04/29/2020 1641   GFRAA 107 04/29/2020 1641    Lipid Panel     Component Value Date/Time   CHOL 144 04/29/2020 1641   TRIG 106 04/29/2020 1641   HDL 34 (L) 04/29/2020 1641   CHOLHDL 4.2 04/29/2020 1641   CHOLHDL 5 04/28/2019 0802   VLDL 42.8 (H) 04/28/2019 0802   LDLCALC 90 04/29/2020 1641    CBC    Component Value Date/Time   WBC 8.3 04/29/2020 1641   WBC 7.7 04/28/2019 0802   RBC 4.85 04/29/2020 1641   RBC 4.94 04/28/2019 0802   HGB 13.1 04/29/2020 1641   HCT 40.0 04/29/2020 1641   PLT 298 04/29/2020 1641   MCV 83 04/29/2020 1641   MCH 27.0 04/29/2020 1641   MCH 28.8 09/29/2017 0446   MCHC 32.8 04/29/2020 1641   MCHC 33.3 04/28/2019 0802   RDW 13.1 04/29/2020 1641   LYMPHSABS 2.2 04/28/2019 0802    MONOABS 0.6 04/28/2019 0802   EOSABS 0.3 04/28/2019 0802   BASOSABS 0.0 04/28/2019 0802    Hgb A1C Lab Results  Component Value Date   HGBA1C 5.6 04/29/2020           Assessment & Plan:   Abnormal Weight Gain:  Phentermine reduced to 15 mg  Encouraged high protein, lots of veggies, low carb diet Encouraged at least 150 minutes of exercise per week  Hypothyroidism:  TSH and Free T4 today  RTC in 1 month for weight check Webb Silversmith, NP This visit occurred during the SARS-CoV-2 public health emergency.  Safety protocols were in place, including screening questions prior to the visit, additional usage of staff PPE, and extensive cleaning of exam room while observing appropriate contact time as indicated for disinfecting solutions.

## 2020-06-03 NOTE — Patient Instructions (Signed)

## 2020-06-03 NOTE — Addendum Note (Signed)
Addended by: Lurlean Nanny on: 06/03/2020 03:49 PM   Modules accepted: Orders

## 2020-06-03 NOTE — Addendum Note (Signed)
Addended by: Cloyd Stagers on: 06/03/2020 04:00 PM   Modules accepted: Orders

## 2020-06-04 LAB — T4, FREE: Free T4: 1.52 ng/dL (ref 0.82–1.77)

## 2020-06-04 LAB — TSH: TSH: <0.005 u[IU]/mL — ABNORMAL LOW (ref 0.450–4.500)

## 2020-06-06 ENCOUNTER — Ambulatory Visit: Payer: 59 | Admitting: Urology

## 2020-06-10 ENCOUNTER — Encounter: Payer: Self-pay | Admitting: Internal Medicine

## 2020-06-10 MED ORDER — LEVOTHYROXINE SODIUM 150 MCG PO TABS
150.0000 ug | ORAL_TABLET | Freq: Every day | ORAL | 0 refills | Status: DC
Start: 2020-06-10 — End: 2020-07-01

## 2020-06-13 ENCOUNTER — Telehealth: Payer: Self-pay

## 2020-06-13 NOTE — Telephone Encounter (Signed)
Contacted Caryl Pina at [facility] and she said script for synthroid was written for generic and pt needed brand name. Advised this nurse could give verbal for brand. She then heard the pharmacist say they got the script to go through on Saturday as brand and do not need a verbal order.

## 2020-06-13 NOTE — Telephone Encounter (Signed)
[facility] Night - Client TELEPHONE ADVICE RECORD [facility] Patient Name: Katelyn [name] Gender: Unknown DOB: Jul 06, 1956 Age: 64 Y 43 M 29 D Return Phone Number: Address[place] [address] Client [place] Night - Client Client Site [place] - Night Contact Type Call Who Is [place] Call Type Pharmacy Send to RN Chief Complaint Prescription Refill or Medication Request (non symptomatic) Reason for Call Request to change medication order Initial Comment Caller states her name is [name] she needs a change on the prescription. Pharmacy Name [facility] Pharmacist Name [place] Number [phone] Translation No Disp. Time Eilene Ghazi Time) Disposition Final User 06/10/2020 9:18:56 PM Pharmacy Call Carlis Abbott, RN, [facility] Reason: [place] to speak with them at [phone] and they are already closed for the day. 06/10/2020 9:19:03 PM Clinical Call Yes Carlis Abbott, RN, [facility]

## 2020-06-30 ENCOUNTER — Other Ambulatory Visit: Payer: Self-pay

## 2020-06-30 DIAGNOSIS — I10 Essential (primary) hypertension: Secondary | ICD-10-CM

## 2020-06-30 NOTE — Telephone Encounter (Signed)
Patient is requesting a refill on medication until new appt date    levothyroxine (SYNTHROID) 150 MCG tablet

## 2020-07-01 MED ORDER — PHENTERMINE HCL 15 MG PO CAPS: 15.0000 mg | ORAL_CAPSULE | ORAL | 0 refills | Status: DC

## 2020-07-01 MED ORDER — ATENOLOL 25 MG PO TABS: 25.0000 mg | ORAL_TABLET | ORAL | 0 refills | Status: DC

## 2020-07-01 MED ORDER — LEVOTHYROXINE SODIUM 150 MCG PO TABS: 150.0000 ug | ORAL_TABLET | Freq: Every day | ORAL | 0 refills | Status: DC

## 2020-07-01 NOTE — Addendum Note (Signed)
Addended by: Lurlean Nanny on: 07/01/2020 02:11 PM   Modules accepted: Orders

## 2020-07-01 NOTE — Telephone Encounter (Signed)
Pt's appt had to be rescheduled from next Fri, pt would like refill on Phentermine, also Atenolol that she takes for migraines (you have never filled) please advise

## 2020-07-08 ENCOUNTER — Ambulatory Visit: Payer: 59 | Admitting: Internal Medicine

## 2020-07-29 ENCOUNTER — Other Ambulatory Visit: Payer: Self-pay

## 2020-07-29 ENCOUNTER — Ambulatory Visit: Payer: 59 | Admitting: Internal Medicine

## 2020-07-29 VITALS — BP 134/86 | HR 67 | Temp 97.4°F | Wt 173.0 lb

## 2020-07-29 DIAGNOSIS — E559 Vitamin D deficiency, unspecified: Secondary | ICD-10-CM | POA: Diagnosis not present

## 2020-07-29 DIAGNOSIS — R5383 Other fatigue: Secondary | ICD-10-CM

## 2020-07-29 DIAGNOSIS — E039 Hypothyroidism, unspecified: Secondary | ICD-10-CM | POA: Diagnosis not present

## 2020-07-29 DIAGNOSIS — R635 Abnormal weight gain: Secondary | ICD-10-CM | POA: Diagnosis not present

## 2020-07-29 MED ORDER — PHENTERMINE HCL 37.5 MG PO CAPS
37.5000 mg | ORAL_CAPSULE | ORAL | 3 refills | Status: DC
Start: 2020-07-29 — End: 2021-04-12

## 2020-07-29 NOTE — Progress Notes (Signed)
Subjective:    Patient ID: Katelyn [name], female    DOB: 10-12-1956, 64 y.o.   MRN: [id number]  HPI  Pt presents to the clinic today for follow up of abnormal weight gain. She was started on Phentermine 04/2020. Her starting weight was 175 lbs with a BMI of 29.41. She reports she does not really take Phentermine to lose weight but to maintain her weight. Her weight today is 17.3 lbs with a BMI of 29.24. She reports no adverse effects from the Phentermine. Her BP today is 134/86.  She is also due to repeat her TSH today. Her TSH has been < 0.4, her Levothyroxine was recently decreased to 150 mcg daily.  She also would like her Vit D checked today. She has been very fatigued lately. She reports she gets like this when her Vit D is low. She is taking Vit D3 but does not take Calcium due to GI upset.  Review of Systems      Past Medical History:  Diagnosis Date   Anemia    Headache(784.0)    cluster headaches, migraines- states is taking Atenolol at present as directed per Dr Lacinda Axon  to prevent migraine from increased stress-  states if on medication for several weeks that heart rate will drop to 40's   History of kidney stones    HYPOTHYROIDISM 05/27/2007   Pneumonia    post op x 2 last yrs ago   PONV (postoperative nausea and vomiting)    likes phenergan with anesthesia, has woken up with 2 surgeries per patient    WEIGHT GAIN 07/15/2009    Current Outpatient Medications  Medication Sig Dispense Refill   albuterol (VENTOLIN HFA) 108 (90 Base) MCG/ACT inhaler Inhale 2 puffs into the lungs every 6 (six) hours as needed for wheezing or shortness of breath. 18 g 2   atenolol (TENORMIN) 25 MG tablet Take 1 tablet (25 mg total) by mouth every morning. For migraines 90 tablet 0   Biotin 1 MG CAPS Take 1 mg by mouth daily.     cholecalciferol (VITAMIN D) 1000 UNITS tablet Take 1,000 Units by mouth daily.      Cranberry 1000 MG CAPS Take 1,000 mg by mouth daily.      Cyanocobalamin (VITAMIN B-12) 2500 MCG SUBL Place 2,500 mcg under the tongue daily.      levothyroxine (SYNTHROID) 150 MCG tablet Take 1 tablet (150 mcg total) by mouth daily. 30 tablet 0   mirabegron ER (MYRBETRIQ) 25 MG TB24 tablet Take 25 mg by mouth daily.     phentermine 15 MG capsule Take 1 capsule (15 mg total) by mouth every morning. 30 capsule 0   promethazine (PHENERGAN) 25 MG tablet Take 1 tablet (25 mg total) every 8 (eight) hours as needed by mouth for refractory nausea / vomiting (if response to Zofran not sufficient). 30 tablet 0   tamsulosin (FLOMAX) 0.4 MG CAPS capsule Take 0.4 mg by mouth daily.     No current facility-administered medications for this visit.    Allergies  Allergen Reactions   Adhesive [Tape] Swelling and Other (See Comments)    Reaction:  Eats in skin   Ciprofloxacin Hives and Shortness Of Breath   Dimethicone Hives and Shortness Of Breath   Erythromycin Hives and Shortness Of Breath   Latex Hives and Shortness Of Breath   Other Hives, Shortness Of Breath and Other (See Comments)    Pt is allergic to nitrile gloves.  Pt is allergic to antibacterial  soaps- rash - states she only uses Dove soap  SCDs- patient states - had itching with this    Penicillins Hives, Shortness Of Breath and Other (See Comments)    Has patient had a PCN reaction causing immediate rash, facial/tongue/throat swelling, SOB or lightheadedness with hypotension: Yes Has patient had a PCN reaction causing severe rash involving mucus membranes or skin necrosis: No Has patient had a PCN reaction that required hospitalization No Has patient had a PCN reaction occurring within the last 10 years: No If all of the above answers are "NO", then may proceed with Cephalosporin use.   Pineapple Anaphylaxis   Red Dye Hives and Shortness Of Breath   Shellfish Allergy Hives and Shortness Of Breath   Sulfonamide Derivatives Hives and Shortness Of Breath   Alcohol-Sulfur [Sulfur]  Swelling and Other (See Comments)    Reaction:  Localized swelling  With alcohol    Aspirin Nausea And Vomiting   Doxycycline Nausea And Vomiting   Hibiclens [Chlorhexidine]     Rash    Ceftin [Cefuroxime] Rash    Family History  Problem Relation Age of Onset   Alzheimer's disease Mother    Cervical cancer Mother    Diabetes Sister    Heart attack Brother    Hypertension Neg Hx        family hx   Heart disease Neg Hx        family hx    Social History   Socioeconomic History   Marital status: Married    Spouse name: Not on file   Number of children: Not on file   Years of education: Not on file   Highest education level: Not on file  Occupational History   Not on file  Tobacco Use   Smoking status: Never Smoker   Smokeless tobacco: Never Used  Vaping Use   Vaping Use: Never used  Substance and Sexual Activity   Alcohol use: No   Drug use: No   Sexual activity: Yes  Other Topics Concern   Not on file  Social History Narrative   Not on file   Social Determinants of Health   Financial Resource Strain:    Difficulty of Paying Living Expenses: Not on file  Food Insecurity:    Worried About Running Out of Food in the Last Year: Not on file   [employer] of Food in the Last Year: Not on file  Transportation Needs:    Lack of Transportation (Medical): Not on file   Lack of Transportation (Non-Medical): Not on file  Physical Activity:    Days of Exercise per Week: Not on file   Minutes of Exercise per Session: Not on file  Stress:    Feeling of Stress : Not on file  Social Connections:    Frequency of Communication with Friends and Family: Not on file   Frequency of Social Gatherings with Friends and Family: Not on file   Attends Religious Services: Not on file   Active Member of Clubs or Organizations: Not on file   Attends Archivist Meetings: Not on file   Marital Status: Not on file  Intimate Partner Violence:      Fear of Current or Ex-Partner: Not on file   Emotionally Abused: Not on file   Physically Abused: Not on file   Sexually Abused: Not on file     Constitutional: Pt reports fatigue. Denies fever, malaise, headache or abrupt weight changes.  Respiratory: Denies difficulty breathing, shortness of breath,  cough or sputum production.   Cardiovascular: Denies chest pain, chest tightness, palpitations or swelling in the hands or feet.  Skin: Denies redness, rashes, lesions or ulcercations.  Neurological: Denies dizziness, difficulty with memory, difficulty with speech or problems with balance and coordination.   No other specific complaints in a complete review of systems (except as listed in HPI above).  Objective:   Physical Exam  BP 134/86    Pulse 67    Temp (!) 97.4 F (36.3 C) (Temporal)    Wt 78.5 kg    SpO2 98%    BMI 29.24 kg/m   Wt Readings from Last 3 Encounters:  06/03/20 172 lb (78 kg)  04/29/20 175 lb (79.4 kg)  03/25/20 183 lb 8 oz (83.2 kg)    General: Appears her stated age, oberweight in NAD. Cardiovascular: Normal rate and rhythm.  Pulmonary/Chest: Normal effort and positive vesicular breath sounds.  Neurological: Alert and oriented.   BMET    Component Value Date/Time   NA 143 04/29/2020 1641   K 3.3 (L) 04/29/2020 1641   CL 101 04/29/2020 1641   CO2 27 04/29/2020 1641   GLUCOSE 88 04/29/2020 1641   GLUCOSE 91 04/28/2019 0802   BUN 9 04/29/2020 1641   CREATININE 0.69 04/29/2020 1641   CALCIUM 9.2 04/29/2020 1641   GFRNONAA 93 04/29/2020 1641   GFRAA 107 04/29/2020 1641    Lipid Panel     Component Value Date/Time   CHOL 144 04/29/2020 1641   TRIG 106 04/29/2020 1641   HDL 34 (L) 04/29/2020 1641   CHOLHDL 4.2 04/29/2020 1641   CHOLHDL 5 04/28/2019 0802   VLDL 42.8 (H) 04/28/2019 0802   LDLCALC 90 04/29/2020 1641    CBC    Component Value Date/Time   WBC 8.3 04/29/2020 1641   WBC 7.7 04/28/2019 0802   RBC 4.85 04/29/2020 1641   RBC  4.94 04/28/2019 0802   HGB 13.1 04/29/2020 1641   HCT 40.0 04/29/2020 1641   PLT 298 04/29/2020 1641   MCV 83 04/29/2020 1641   MCH 27.0 04/29/2020 1641   MCH 28.8 09/29/2017 0446   MCHC 32.8 04/29/2020 1641   MCHC 33.3 04/28/2019 0802   RDW 13.1 04/29/2020 1641   LYMPHSABS 2.2 04/28/2019 0802   MONOABS 0.6 04/28/2019 0802   EOSABS 0.3 04/28/2019 0802   BASOSABS 0.0 04/28/2019 0802    Hgb A1C Lab Results  Component Value Date   HGBA1C 5.6 04/29/2020           Assessment & Plan:   Abnormal Weight Gain:  RX for Phentermine 37.5 mg sent to pharmacy Encouraged high protein, low carb diet and exercise for weight loss Will continue to monitor weight, BP  Vit D Deficiency, Fatigue:  Vit D today  Hypothyroidism:  Repeat TSH today Will adjust Levothyroxine if needed based on labs  RTC in 6 months for annual exam Webb Silversmith, NP This visit occurred during the SARS-CoV-2 public health emergency.  Safety protocols were in place, including screening questions prior to the visit, additional usage of staff PPE, and extensive cleaning of exam room while observing appropriate contact time as indicated for disinfecting solutions.

## 2020-07-30 LAB — TSH: TSH: 0.236 u[IU]/mL — ABNORMAL LOW (ref 0.450–4.500)

## 2020-07-30 LAB — T4, FREE: Free T4: 1.13 ng/dL (ref 0.82–1.77)

## 2020-07-30 LAB — VITAMIN D 25 HYDROXY (VIT D DEFICIENCY, FRACTURES): Vit D, 25-Hydroxy: 26.1 ng/mL — ABNORMAL LOW (ref 30.0–100.0)

## 2020-07-31 ENCOUNTER — Encounter: Payer: Self-pay | Admitting: Internal Medicine

## 2020-07-31 NOTE — Patient Instructions (Signed)

## 2020-08-02 ENCOUNTER — Encounter: Payer: Self-pay | Admitting: Internal Medicine

## 2020-08-02 MED ORDER — LEVOTHYROXINE SODIUM 125 MCG PO TABS
125.0000 ug | ORAL_TABLET | Freq: Every day | ORAL | 0 refills | Status: DC
Start: 2020-08-02 — End: 2020-08-02

## 2020-08-02 MED ORDER — VITAMIN D (ERGOCALCIFEROL) 1.25 MG (50000 UNIT) PO CAPS
50000.0000 [IU] | ORAL_CAPSULE | ORAL | 0 refills | Status: DC
Start: 2020-08-02 — End: 2020-11-07

## 2020-08-02 MED ORDER — SYNTHROID 137 MCG PO TABS
137.0000 ug | ORAL_TABLET | Freq: Every day | ORAL | 0 refills | Status: DC
Start: 2020-08-02 — End: 2020-09-01

## 2020-08-02 MED ORDER — LEVOTHYROXINE SODIUM 125 MCG PO TABS: 125.0000 ug | ORAL_TABLET | Freq: Every day | ORAL | 0 refills | Status: DC

## 2020-08-02 NOTE — Addendum Note (Signed)
Addended by: Jearld Fenton on: 08/02/2020 08:27 PM   Modules accepted: Orders

## 2020-08-02 NOTE — Addendum Note (Signed)
Addended by: Lurlean Nanny on: 08/02/2020 05:37 PM   Modules accepted: Orders

## 2020-08-30 ENCOUNTER — Other Ambulatory Visit: Payer: Self-pay | Admitting: Internal Medicine

## 2020-09-27 ENCOUNTER — Other Ambulatory Visit: Payer: Self-pay | Admitting: Internal Medicine

## 2020-09-27 DIAGNOSIS — I10 Essential (primary) hypertension: Secondary | ICD-10-CM

## 2020-10-28 ENCOUNTER — Telehealth: Payer: Self-pay

## 2020-10-28 NOTE — Telephone Encounter (Signed)
lmovm for pt to return call... Pt needs to schedule a 30 min appt for surgical clearance

## 2020-11-01 ENCOUNTER — Ambulatory Visit: Payer: 59 | Admitting: Internal Medicine

## 2020-11-02 ENCOUNTER — Other Ambulatory Visit: Payer: Self-pay

## 2020-11-02 ENCOUNTER — Encounter: Payer: Self-pay | Admitting: Internal Medicine

## 2020-11-02 ENCOUNTER — Ambulatory Visit: Payer: 59 | Admitting: Internal Medicine

## 2020-11-02 VITALS — BP 130/68 | HR 67 | Temp 97.6°F | Wt 166.5 lb

## 2020-11-02 DIAGNOSIS — L905 Scar conditions and fibrosis of skin: Secondary | ICD-10-CM

## 2020-11-02 DIAGNOSIS — Z01818 Encounter for other preprocedural examination: Secondary | ICD-10-CM | POA: Diagnosis not present

## 2020-11-02 LAB — POCT URINALYSIS DIPSTICK
Bilirubin, UA: NEGATIVE
Glucose, UA: NEGATIVE
Ketones, UA: NEGATIVE
Leukocytes, UA: NEGATIVE
Nitrite, UA: NEGATIVE
Odor: ABNORMAL
Protein, UA: POSITIVE — AB
Spec Grav, UA: >=1.03 — AB (ref 1.010–1.025)
Urobilinogen, UA: 0.2 E.U./dL
pH, UA: 6 (ref 5.0–8.0)

## 2020-11-02 NOTE — Progress Notes (Signed)
Subjective:    Patient ID: Katelyn [name], female    DOB: 10-Dec-1955, 64 y.o.   MRN: [id number]  HPI  Patient presents the clinic today for preop examination.  She will be having scar revision of her left total knee by Dr. Ihor Gully scheduled November 24, 2020. She reports her left knee locks up on her. When this occurs, she feels sharp pain just above the kneecap.  The pain resolves after the knee pops.  She has not noticed any swelling.  She does report some numbness around her scar but denies numbness, tingling or weakness of the left lower extremity.  Review of Systems      Past Medical History:  Diagnosis Date  . Anemia   . Headache(784.0)    cluster headaches, migraines- states is taking Atenolol at present as directed per Dr Lacinda Axon  to prevent migraine from increased stress-  states if on medication for several weeks that heart rate will drop to 40's  . History of kidney stones   . HYPOTHYROIDISM 05/27/2007  . Pneumonia    post op x 2 last yrs ago  . PONV (postoperative nausea and vomiting)    likes phenergan with anesthesia, has woken up with 2 surgeries per patient   . WEIGHT GAIN 07/15/2009    Current Outpatient Medications  Medication Sig Dispense Refill  . albuterol (VENTOLIN HFA) 108 (90 Base) MCG/ACT inhaler Inhale 2 puffs into the lungs every 6 (six) hours as needed for wheezing or shortness of breath. 18 g 2  . atenolol (TENORMIN) 25 MG tablet TAKE 1 TABLET BY MOUTH EVERY MORNING FORMIGRAINES 90 tablet 0  . Biotin 1 MG CAPS Take 1 mg by mouth daily.    . cholecalciferol (VITAMIN D) 1000 UNITS tablet Take 1,000 Units by mouth daily.     . Cranberry 1000 MG CAPS Take 1,000 mg by mouth daily.    . Cyanocobalamin (VITAMIN B-12) 2500 MCG SUBL Place 2,500 mcg under the tongue daily.     . mirabegron ER (MYRBETRIQ) 25 MG TB24 tablet Take 25 mg by mouth daily.    . phentermine 37.5 MG capsule Take 1 capsule (37.5 mg total) by mouth every morning. 30 capsule 3  .  promethazine (PHENERGAN) 25 MG tablet Take 1 tablet (25 mg total) every 8 (eight) hours as needed by mouth for refractory nausea / vomiting (if response to Zofran not sufficient). 30 tablet 0  . SYNTHROID 137 MCG tablet TAKE 1 TABLET BY MOUTH ONCE A DAY BEFOREBREAKFAST (SCHEDULE LAB APPT PER DR) 90 tablet 0  . tamsulosin (FLOMAX) 0.4 MG CAPS capsule Take 0.4 mg by mouth daily.    . Vitamin D, Ergocalciferol, (DRISDOL) 1.25 MG (50000 UNIT) CAPS capsule Take 1 capsule (50,000 Units total) by mouth every 7 (seven) days. 12 capsule 0   No current facility-administered medications for this visit.    Allergies  Allergen Reactions  . Adhesive [Tape] Swelling and Other (See Comments)    Reaction:  Eats in skin  . Ciprofloxacin Hives and Shortness Of Breath  . Dimethicone Hives and Shortness Of Breath  . Erythromycin Hives and Shortness Of Breath  . Latex Hives and Shortness Of Breath  . Other Hives, Shortness Of Breath and Other (See Comments)    Pt is allergic to nitrile gloves.  Pt is allergic to antibacterial soaps- rash - states she only uses Dove soap  SCDs- patient states - had itching with this   . Penicillins Hives, Shortness Of Breath and Other (  See Comments)    Has patient had a PCN reaction causing immediate rash, facial/tongue/throat swelling, SOB or lightheadedness with hypotension: Yes Has patient had a PCN reaction causing severe rash involving mucus membranes or skin necrosis: No Has patient had a PCN reaction that required hospitalization No Has patient had a PCN reaction occurring within the last 10 years: No If all of the above answers are "NO", then may proceed with Cephalosporin use.  Marland Kitchen Pineapple Anaphylaxis  . Red Dye Hives and Shortness Of Breath  . Shellfish Allergy Hives and Shortness Of Breath  . Sulfonamide Derivatives Hives and Shortness Of Breath  . Alcohol-Sulfur [Sulfur] Swelling and Other (See Comments)    Reaction:  Localized swelling  With alcohol   .  Aspirin Nausea And Vomiting  . Doxycycline Nausea And Vomiting  . Hibiclens [Chlorhexidine]     Rash   . Ceftin [Cefuroxime] Rash    Family History  Problem Relation Age of Onset  . Alzheimer's disease Mother   . Cervical cancer Mother   . Diabetes Sister   . Heart attack Brother   . Hypertension Neg Hx        family hx  . Heart disease Neg Hx        family hx    Social History   Socioeconomic History  . Marital status: Married    Spouse name: Not on file  . Number of children: Not on file  . Years of education: Not on file  . Highest education level: Not on file  Occupational History  . Not on file  Tobacco Use  . Smoking status: Never Smoker  . Smokeless tobacco: Never Used  Vaping Use  . Vaping Use: Never used  Substance and Sexual Activity  . Alcohol use: No  . Drug use: No  . Sexual activity: Yes  Other Topics Concern  . Not on file  Social History Narrative  . Not on file   Social Determinants of Health   Financial Resource Strain: Not on file  Food Insecurity: Not on file  Transportation Needs: Not on file  Physical Activity: Not on file  Stress: Not on file  Social Connections: Not on file  Intimate Partner Violence: Not on file     Constitutional: Denies fever, malaise, fatigue, headache or abrupt weight changes.  Respiratory: Denies difficulty breathing, shortness of breath, cough or sputum production.   Cardiovascular: Denies chest pain, chest tightness, palpitations or swelling in the hands or feet.  Musculoskeletal: Patient reports left knee pain.  Denies decrease in range of motion, difficulty with gait, muscle pain or joint swelling.  Skin: Denies redness, rashes, lesions or ulcercations.  Neurological: Denies numbness, tingling, weakness or problems with balance and coordination.    No other specific complaints in a complete review of systems (except as listed in HPI above).  Objective:   Physical Exam  BP 130/68   Pulse 67   Temp  97.6 F (36.4 C) (Temporal)   Wt 166 lb 8 oz (75.5 kg)   SpO2 97%   BMI 28.14 kg/m   Wt Readings from Last 3 Encounters:  07/29/20 173 lb (78.5 kg)  06/03/20 172 lb (78 kg)  04/29/20 175 lb (79.4 kg)    General: Appears her stated age, well developed, well nourished in NAD. Skin: Warm, dry and intact.  Scar noted over anterior knee Cardiovascular: Normal rate and rhythm. S1,S2 noted.  No murmur, rubs or gallops noted. No JVD or BLE edema.  Pedal pulses 2+  bilaterally. Pulmonary/Chest: Normal effort and positive vesicular breath sounds. No respiratory distress. No wheezes, rales or ronchi noted.  Musculoskeletal: Normal flexion and extension of left knee.  No joint swelling noted.  Pain with palpation just above the left kneecap.  Strength 5/5 BLE.  Gait slow and steady with use of cane. Neurological: Alert and oriented.    BMET    Component Value Date/Time   NA 143 04/29/2020 1641   K 3.3 (L) 04/29/2020 1641   CL 101 04/29/2020 1641   CO2 27 04/29/2020 1641   GLUCOSE 88 04/29/2020 1641   GLUCOSE 91 04/28/2019 0802   BUN 9 04/29/2020 1641   CREATININE 0.69 04/29/2020 1641   CALCIUM 9.2 04/29/2020 1641   GFRNONAA 93 04/29/2020 1641   GFRAA 107 04/29/2020 1641    Lipid Panel     Component Value Date/Time   CHOL 144 04/29/2020 1641   TRIG 106 04/29/2020 1641   HDL 34 (L) 04/29/2020 1641   CHOLHDL 4.2 04/29/2020 1641   CHOLHDL 5 04/28/2019 0802   VLDL 42.8 (H) 04/28/2019 0802   LDLCALC 90 04/29/2020 1641    CBC    Component Value Date/Time   WBC 8.3 04/29/2020 1641   WBC 7.7 04/28/2019 0802   RBC 4.85 04/29/2020 1641   RBC 4.94 04/28/2019 0802   HGB 13.1 04/29/2020 1641   HCT 40.0 04/29/2020 1641   PLT 298 04/29/2020 1641   MCV 83 04/29/2020 1641   MCH 27.0 04/29/2020 1641   MCH 28.8 09/29/2017 0446   MCHC 32.8 04/29/2020 1641   MCHC 33.3 04/28/2019 0802   RDW 13.1 04/29/2020 1641   LYMPHSABS 2.2 04/28/2019 0802   MONOABS 0.6 04/28/2019 0802   EOSABS 0.3  04/28/2019 0802   BASOSABS 0.0 04/28/2019 0802    Hgb A1C Lab Results  Component Value Date   HGBA1C 5.6 04/29/2020            Assessment & Plan:    Preop examination for Scar Revision of Prior Left Total Knee:  Indication for ECG: Preop screening Interpretation of ECG: normal rate and rhythm, old infarct, no acute findings Comparison of ECG: 09/2017, unchanged We will check CBC, C met, albumin, PT/INR, A1c and urinalysis Plan will be for completed and faxed to Dr. Honor Loh office  Return precautions discussed  Webb Silversmith, NP This visit occurred during the SARS-CoV-2 public health emergency.  Safety protocols were in place, including screening questions prior to the visit, additional usage of staff PPE, and extensive cleaning of exam room while observing appropriate contact time as indicated for disinfecting solutions.

## 2020-11-02 NOTE — Patient Instructions (Signed)
Journal for Nurse Practitioners, 15(4), 263-267. Retrieved August 18, 2018 from http://clinicalkey.com/nursing">  Knee Exercises Ask your health care provider which exercises are safe for you. Do exercises exactly as told by your health care provider and adjust them as directed. It is normal to feel mild stretching, pulling, tightness, or discomfort as you do these exercises. Stop right away if you feel sudden pain or your pain gets worse. Do not begin these exercises until told by your health care provider. Stretching and range-of-motion exercises These exercises warm up your muscles and joints and improve the movement and flexibility of your knee. These exercises also help to relieve pain and swelling. Knee extension, prone 1. Lie on your abdomen (prone position) on a bed. 2. Place your left / right knee just beyond the edge of the surface so your knee is not on the bed. You can put a towel under your left / right thigh just above your kneecap for comfort. 3. Relax your leg muscles and allow gravity to straighten your knee (extension). You should feel a stretch behind your left / right knee. 4. Hold this position for __________ seconds. 5. Scoot up so your knee is supported between repetitions. Repeat __________ times. Complete this exercise __________ times a day. Knee flexion, active  1. Lie on your back with both legs straight. If this causes back discomfort, bend your left / right knee so your foot is flat on the floor. 2. Slowly slide your left / right heel back toward your buttocks. Stop when you feel a gentle stretch in the front of your knee or thigh (flexion). 3. Hold this position for __________ seconds. 4. Slowly slide your left / right heel back to the starting position. Repeat __________ times. Complete this exercise __________ times a day. Quadriceps stretch, prone  1. Lie on your abdomen on a firm surface, such as a bed or padded floor. 2. Bend your left / right knee and hold  your ankle. If you cannot reach your ankle or pant leg, loop a belt around your foot and grab the belt instead. 3. Gently pull your heel toward your buttocks. Your knee should not slide out to the side. You should feel a stretch in the front of your thigh and knee (quadriceps). 4. Hold this position for __________ seconds. Repeat __________ times. Complete this exercise __________ times a day. Hamstring, supine 1. Lie on your back (supine position). 2. Loop a belt or towel over the ball of your left / right foot. The ball of your foot is on the walking surface, right under your toes. 3. Straighten your left / right knee and slowly pull on the belt to raise your leg until you feel a gentle stretch behind your knee (hamstring). ? Do not let your knee bend while you do this. ? Keep your other leg flat on the floor. 4. Hold this position for __________ seconds. Repeat __________ times. Complete this exercise __________ times a day. Strengthening exercises These exercises build strength and endurance in your knee. Endurance is the ability to use your muscles for a long time, even after they get tired. Quadriceps, isometric This exercise stretches the muscles in front of your thigh (quadriceps) without moving your knee joint (isometric). 1. Lie on your back with your left / right leg extended and your other knee bent. Put a rolled towel or small pillow under your knee if told by your health care provider. 2. Slowly tense the muscles in the front of your left /   right thigh. You should see your kneecap slide up toward your hip or see increased dimpling just above the knee. This motion will push the back of the knee toward the floor. 3. For __________ seconds, hold the muscle as tight as you can without increasing your pain. 4. Relax the muscles slowly and completely. Repeat __________ times. Complete this exercise __________ times a day. Straight leg raises This exercise stretches the muscles in front  of your thigh (quadriceps) and the muscles that move your hips (hip flexors). 1. Lie on your back with your left / right leg extended and your other knee bent. 2. Tense the muscles in the front of your left / right thigh. You should see your kneecap slide up or see increased dimpling just above the knee. Your thigh may even shake a bit. 3. Keep these muscles tight as you raise your leg 4-6 inches (10-15 cm) off the floor. Do not let your knee bend. 4. Hold this position for __________ seconds. 5. Keep these muscles tense as you lower your leg. 6. Relax your muscles slowly and completely after each repetition. Repeat __________ times. Complete this exercise __________ times a day. Hamstring, isometric 1. Lie on your back on a firm surface. 2. Bend your left / right knee about __________ degrees. 3. Dig your left / right heel into the surface as if you are trying to pull it toward your buttocks. Tighten the muscles in the back of your thighs (hamstring) to "dig" as hard as you can without increasing any pain. 4. Hold this position for __________ seconds. 5. Release the tension gradually and allow your muscles to relax completely for __________ seconds after each repetition. Repeat __________ times. Complete this exercise __________ times a day. Hamstring curls If told by your health care provider, do this exercise while wearing ankle weights. Begin with __________ lb weights. Then increase the weight by 1 lb (0.5 kg) increments. Do not wear ankle weights that are more than __________ lb. 1. Lie on your abdomen with your legs straight. 2. Bend your left / right knee as far as you can without feeling pain. Keep your hips flat against the floor. 3. Hold this position for __________ seconds. 4. Slowly lower your leg to the starting position. Repeat __________ times. Complete this exercise __________ times a day. Squats This exercise strengthens the muscles in front of your thigh and knee  (quadriceps). 1. Stand in front of a table, with your feet and knees pointing straight ahead. You may rest your hands on the table for balance but not for support. 2. Slowly bend your knees and lower your hips like you are going to sit in a chair. ? Keep your weight over your heels, not over your toes. ? Keep your lower legs upright so they are parallel with the table legs. ? Do not let your hips go lower than your knees. ? Do not bend lower than told by your health care provider. ? If your knee pain increases, do not bend as low. 3. Hold the squat position for __________ seconds. 4. Slowly push with your legs to return to standing. Do not use your hands to pull yourself to standing. Repeat __________ times. Complete this exercise __________ times a day. Wall slides This exercise strengthens the muscles in front of your thigh and knee (quadriceps). 1. Lean your back against a smooth wall or door, and walk your feet out 18-24 inches (46-61 cm) from it. 2. Place your feet hip-width apart. 3.   Slowly slide down the wall or door until your knees bend __________ degrees. Keep your knees over your heels, not over your toes. Keep your knees in line with your hips. 4. Hold this position for __________ seconds. Repeat __________ times. Complete this exercise __________ times a day. Straight leg raises This exercise strengthens the muscles that rotate the leg at the hip and move it away from your body (hip abductors). 1. Lie on your side with your left / right leg in the top position. Lie so your head, shoulder, knee, and hip line up. You may bend your bottom knee to help you keep your balance. 2. Roll your hips slightly forward so your hips are stacked directly over each other and your left / right knee is facing forward. 3. Leading with your heel, lift your top leg 4-6 inches (10-15 cm). You should feel the muscles in your outer hip lifting. ? Do not let your foot drift forward. ? Do not let your knee  roll toward the ceiling. 4. Hold this position for __________ seconds. 5. Slowly return your leg to the starting position. 6. Let your muscles relax completely after each repetition. Repeat __________ times. Complete this exercise __________ times a day. Straight leg raises This exercise stretches the muscles that move your hips away from the front of the pelvis (hip extensors). 1. Lie on your abdomen on a firm surface. You can put a pillow under your hips if that is more comfortable. 2. Tense the muscles in your buttocks and lift your left / right leg about 4-6 inches (10-15 cm). Keep your knee straight as you lift your leg. 3. Hold this position for __________ seconds. 4. Slowly lower your leg to the starting position. 5. Let your leg relax completely after each repetition. Repeat __________ times. Complete this exercise __________ times a day. This information is not intended to replace advice given to you by your health care provider. Make sure you discuss any questions you have with your health care provider. Document Revised: 08/19/2018 Document Reviewed: 08/19/2018 Elsevier Patient Education  2020 [employer]  

## 2020-11-03 LAB — COMPREHENSIVE METABOLIC PANEL
ALT: 13 IU/L (ref 0–32)
AST: 14 IU/L (ref 0–40)
Albumin/Globulin Ratio: 1.8 (ref 1.2–2.2)
Albumin: 4.4 g/dL (ref 3.8–4.8)
Alkaline Phosphatase: 107 IU/L (ref 44–121)
BUN/Creatinine Ratio: 15 (ref 12–28)
BUN: 12 mg/dL (ref 8–27)
Bilirubin Total: 0.3 mg/dL (ref 0.0–1.2)
CO2: 23 mmol/L (ref 20–29)
Calcium: 9.3 mg/dL (ref 8.7–10.3)
Chloride: 102 mmol/L (ref 96–106)
Creatinine, Ser: 0.79 mg/dL (ref 0.57–1.00)
GFR calc Af Amer: 91 mL/min/{1.73_m2} (ref >59)
GFR calc non Af Amer: 79 mL/min/{1.73_m2} (ref >59)
Globulin, Total: 2.4 g/dL (ref 1.5–4.5)
Glucose: 64 mg/dL — ABNORMAL LOW (ref 65–99)
Potassium: 3.6 mmol/L (ref 3.5–5.2)
Sodium: 141 mmol/L (ref 134–144)
Total Protein: 6.8 g/dL (ref 6.0–8.5)

## 2020-11-03 LAB — PROTIME-INR
INR: 1 (ref 0.9–1.2)
Prothrombin Time: 10.3 s (ref 9.1–12.0)

## 2020-11-03 LAB — CBC
Hematocrit: 41.7 % (ref 34.0–46.6)
Hemoglobin: 14 g/dL (ref 11.1–15.9)
MCH: 28.9 pg (ref 26.6–33.0)
MCHC: 33.6 g/dL (ref 31.5–35.7)
MCV: 86 fL (ref 79–97)
Platelets: 337 10*3/uL (ref 150–450)
RBC: 4.85 x10E6/uL (ref 3.77–5.28)
RDW: 12.4 % (ref 11.7–15.4)
WBC: 7.7 10*3/uL (ref 3.4–10.8)

## 2020-11-03 LAB — HEMOGLOBIN A1C
Est. average glucose Bld gHb Est-mCnc: 114 mg/dL
Hgb A1c MFr Bld: 5.6 % (ref 4.8–5.6)

## 2020-11-03 NOTE — Telephone Encounter (Signed)
Can we add on TSH and Vit D to blood drawn yesterday? Dx Acquired Hypothyroidism and Vit D deficiency?

## 2020-11-04 LAB — SPECIMEN STATUS REPORT

## 2020-11-04 LAB — TSH: TSH: 0.588 u[IU]/mL (ref 0.450–4.500)

## 2020-11-04 LAB — VITAMIN D 25 HYDROXY (VIT D DEFICIENCY, FRACTURES): Vit D, 25-Hydroxy: 28.4 ng/mL — ABNORMAL LOW (ref 30.0–100.0)

## 2020-11-14 NOTE — Progress Notes (Signed)
DUE TO COVID-19 ONLY ONE VISITOR IS ALLOWED TO COME WITH YOU AND STAY IN THE WAITING ROOM ONLY DURING PRE OP AND PROCEDURE DAY OF SURGERY. THE 1 VISITOR  MAY VISIT WITH YOU AFTER SURGERY IN YOUR PRIVATE ROOM DURING VISITING HOURS ONLY!  YOU NEED TO HAVE A COVID 19 TEST ON_1/08/2021 ______ @_______ , THIS TEST MUST BE DONE BEFORE SURGERY,  COVID TESTING SITE [address] [place] , IT IS ON THE RIGHT GOING OUT [address] APPROXIMATELY  2 MINUTES PAST [employer] ON THE RIGHT. ONCE YOUR COVID TEST IS COMPLETED,  PLEASE BEGIN THE QUARANTINE INSTRUCTIONS AS OUTLINED IN YOUR HANDOUT.                Toryn Dewalt Smart  11/14/2020   Your procedure is scheduled on: 11/24/2020    Report to [facility] Main  Entrance   Report to admitting at   0610am      Call this number if you have problems the morning of surgery [phone]    REMEMBER: NO  SOLID FOOD CANDY OR GUM AFTER MIDNIGHT. CLEAR LIQUIDS UNTIL    0540am        . NOTHING BY MOUTH EXCEPT CLEAR LIQUIDS UNTIL    . PLEASE FINISH ENSURE DRINK PER SURGEON ORDER  WHICH NEEDS TO BE COMPLETED AT   0540am    .      CLEAR LIQUID DIET   Foods Allowed                                                                    Coffee and tea, regular and decaf                            Fruit ices (not with fruit pulp)                                      Iced Popsicles                                    Carbonated beverages, regular and diet                                    Cranberry, grape and apple juices Sports drinks like Gatorade Lightly seasoned clear broth or consume(fat free) Sugar, honey syrup ___________________________________________________________________      BRUSH YOUR TEETH MORNING OF SURGERY AND RINSE YOUR MOUTH OUT, NO CHEWING GUM CANDY OR MINTS.     Take these medicines the morning of surgery with A SIP OF WATER: inhalers as usual and bring, Synthroid, Myrbetriq  DO NOT TAKE ANY DIABETIC  MEDICATIONS DAY OF YOUR SURGERY                               You may not have any metal on your body including hair pins and  piercings  Do not wear jewelry, make-up, lotions, powders or perfumes, deodorant             Do not wear nail polish on your fingernails.  Do not shave  48 hours prior to surgery.              Men may shave face and neck.   Do not bring valuables to the hospital. [place].  Contacts, dentures or bridgework may not be worn into surgery.  Leave suitcase in the car. After surgery it may be brought to your room.     Patients discharged the day of surgery will not be allowed to drive home. IF YOU ARE HAVING SURGERY AND GOING HOME THE SAME DAY, YOU MUST HAVE AN ADULT TO DRIVE YOU HOME AND BE WITH YOU FOR 24 HOURS. YOU MAY GO HOME BY TAXI OR UBER OR ORTHERWISE, BUT AN ADULT MUST ACCOMPANY YOU HOME AND STAY WITH YOU FOR 24 HOURS.  Name and phone number of your driver:  Special Instructions: N/A              Please read over the following fact sheets you were given: _____________________________________________________________________  [facility] - Preparing for Surgery Before surgery, you can play an important role.  Because skin is not sterile, your skin needs to be as free of germs as possible.  You can reduce the number of germs on your skin by washing with CHG (chlorahexidine gluconate) soap before surgery.  CHG is an antiseptic cleaner which kills germs and bonds with the skin to continue killing germs even after washing. Please DO NOT use if you have an allergy to CHG or antibacterial soaps.  If your skin becomes reddened/irritated stop using the CHG and inform your nurse when you arrive at Short Stay. Do not shave (including legs and underarms) for at least 48 hours prior to the first CHG shower.  You may shave your face/neck. Please follow these instructions carefully:  1.  Shower with CHG Soap the night  before surgery and the  morning of Surgery.  2.  If you choose to wash your hair, wash your hair first as usual with your  normal  shampoo.  3.  After you shampoo, rinse your hair and body thoroughly to remove the  shampoo.                           4.  Use CHG as you would any other liquid soap.  You can apply chg directly  to the skin and wash                       Gently with a scrungie or clean washcloth.  5.  Apply the CHG Soap to your body ONLY FROM THE NECK DOWN.   Do not use on face/ open                           Wound or open sores. Avoid contact with eyes, ears mouth and genitals (private parts).                       Wash face,  Genitals (private parts) with your normal soap.             6.  Wash thoroughly, paying special attention to the area where your surgery  will be performed.  7.  Thoroughly rinse your body with warm water from the neck down.  8.  DO NOT shower/wash with your normal soap after using and rinsing off  the CHG Soap.                9.  Pat yourself dry with a clean towel.            10.  Wear clean pajamas.            11.  Place clean sheets on your bed the night of your first shower and do not  sleep with pets. Day of Surgery : Do not apply any lotions/deodorants the morning of surgery.  Please wear clean clothes to the hospital/surgery center.  FAILURE TO FOLLOW THESE INSTRUCTIONS MAY RESULT IN THE CANCELLATION OF YOUR SURGERY PATIENT SIGNATURE_________________________________  NURSE SIGNATURE__________________________________  ________________________________________________________________________

## 2020-11-15 ENCOUNTER — Encounter (HOSPITAL_COMMUNITY): Payer: Self-pay

## 2020-11-15 ENCOUNTER — Encounter (HOSPITAL_COMMUNITY)
Admission: RE | Admit: 2020-11-15 | Discharge: 2020-11-15 | Disposition: A | Discharge status: Home / Self Care | Payer: 59 | Source: Ambulatory Visit | Attending: Orthopedic Surgery | Admitting: Orthopedic Surgery

## 2020-11-15 ENCOUNTER — Other Ambulatory Visit: Payer: Self-pay

## 2020-11-15 DIAGNOSIS — Z01812 Encounter for preprocedural laboratory examination: Secondary | ICD-10-CM | POA: Diagnosis present

## 2020-11-15 HISTORY — DX: Unspecified asthma, uncomplicated: J45.909

## 2020-11-15 HISTORY — DX: Unspecified osteoarthritis, unspecified site: M19.90

## 2020-11-15 LAB — CBC
HCT: 45.2 % (ref 36.0–46.0)
Hemoglobin: 14.8 g/dL (ref 12.0–15.0)
MCH: 28.4 pg (ref 26.0–34.0)
MCHC: 32.7 g/dL (ref 30.0–36.0)
MCV: 86.6 fL (ref 80.0–100.0)
Platelets: 314 10*3/uL (ref 150–400)
RBC: 5.22 MIL/uL — ABNORMAL HIGH (ref 3.87–5.11)
RDW: 12.2 % (ref 11.5–15.5)
WBC: 7.8 10*3/uL (ref 4.0–10.5)
nRBC: 0 % (ref 0.0–0.2)

## 2020-11-15 LAB — BASIC METABOLIC PANEL
Anion gap: 7 (ref 5–15)
BUN: 9 mg/dL (ref 8–23)
CO2: 29 mmol/L (ref 22–32)
Calcium: 9.1 mg/dL (ref 8.9–10.3)
Chloride: 104 mmol/L (ref 98–111)
Creatinine, Ser: 0.66 mg/dL (ref 0.44–1.00)
GFR, Estimated: >60 mL/min (ref >60)
Glucose, Bld: 100 mg/dL — ABNORMAL HIGH (ref 70–99)
Potassium: 3.9 mmol/L (ref 3.5–5.1)
Sodium: 140 mmol/L (ref 135–145)

## 2020-11-15 NOTE — Progress Notes (Signed)
Anesthesia Review:  PCP: Nicki Reaper, NP  LOV- 11/02/20 Cardiologist : Chest x-ray : EKG : 11/02/2020  Echo : Stress test: Cardiac Cath :  Activity level:  Can do a flight of stairs without difficulty  Sleep Study/ CPAP :no  Fasting Blood Sugar :      / Checks Blood Sugar -- times a day:   Blood Thinner/ Instructions /Last Dose: ASA / Instructions/ Last Dose :  Preop Drink not given due to multiple allergies.   Pt has not stopped Phentermine- Instructed pt she needs to speak with Dr Charlann Boxer in regards to this.  Pt has visit on 11/17/2020 with DR Charlann Boxer per pt.   Pt to take Atenolol am of surgery if has migraine.   a1c-12/22-5.6

## 2020-11-22 ENCOUNTER — Other Ambulatory Visit (HOSPITAL_COMMUNITY)
Admission: RE | Admit: 2020-11-22 | Discharge: 2020-11-22 | Disposition: A | Discharge status: Home / Self Care | Payer: 59 | Source: Ambulatory Visit | Attending: Orthopedic Surgery | Admitting: Orthopedic Surgery

## 2020-11-22 DIAGNOSIS — Z20822 Contact with and (suspected) exposure to covid-19: Secondary | ICD-10-CM | POA: Insufficient documentation

## 2020-11-22 DIAGNOSIS — Z01812 Encounter for preprocedural laboratory examination: Secondary | ICD-10-CM | POA: Diagnosis not present

## 2020-11-22 LAB — SARS CORONAVIRUS 2 (TAT 6-24 HRS): SARS Coronavirus 2: NEGATIVE

## 2020-11-22 NOTE — H&P (Signed)
ADMISSION H&P  Patient is being admitted for open excision of scar tissue of left total knee arthroplasty.  Subjective:  Chief Complaint:  Arthrofibrosis of the left total knee arthroplasty  HPI: Katelyn [name] 74, 65 y.o. female, has a history of pain and functional disability in the left knee due to excessive scarring in the left knee and has failed non-surgical conservative treatments for greater than 12 weeks to include NSAID's and/or analgesics, supervised PT with diminished ADL's post treatment, use of assistive devices and activity modification.  Onset of symptoms was gradual, starting  years ago with gradually worsening course since that time. The patient noted prior procedures on the knee to include  arthroplasty on the left knee(s).  Patient currently rates pain in the left knee(s) at 10 out of 10 with activity. Patient has night pain, worsening of pain with activity and weight bearing, pain that interferes with activities of daily living, pain with passive range of motion and joint swelling.  Patient has evidence of previous left TKA by imaging studies.  There is no active infection.  Risks, benefits and expectations were discussed with the patient.  Risks including but not limited to the risk of anesthesia, blood clots, nerve damage, blood vessel damage, failure of the prosthesis, infection and up to and including death.  Patient understand the risks, benefits and expectations and wishes to proceed with surgery.   D/C Plans:       Home (SD)  Post-op Meds:       No Rx given   Tranexamic Acid:      To be given - IV   Decadron:      Is to be given  FYI:      Xarelto   Norco  Able to use Keflex  DME:   Rx sent for - RW & 3-N-1  PT:   OPPT   Pharmacy: [facility]    Patient Active Problem List   Diagnosis Date Noted  . Frequent headaches 03/26/2020  . Hyperlipidemia 04/29/2019  . Hypertension 10/18/2011  . Hypothyroidism 05/27/2007   Past Medical History:   Diagnosis Date  . Anemia   . Arthritis   . Asthma   . Headache(784.0)    cluster headaches, migraines- states is taking Atenolol at present as directed per Dr Lacinda Axon  to prevent migraine from increased stress-  states if on medication for several weeks that heart rate will drop to 40's  . History of kidney stones   . HYPOTHYROIDISM 05/27/2007  . Pneumonia    post op x 2 last yrs ago  . PONV (postoperative nausea and vomiting)    likes phenergan with anesthesia, has woken up with 2 surgeries per patient   . WEIGHT GAIN 07/15/2009    Past Surgical History:  Procedure Laterality Date  . ABDOMINAL HYSTERECTOMY    . APPENDECTOMY    . CYSTOSCOPY WITH RETROGRADE PYELOGRAM, URETEROSCOPY AND STENT PLACEMENT Right 04/26/2017   Procedure: CYSTOSCOPY WITH RETROGRADE PYELOGRAM, URETEROSCOPY AND STENT PLACEMENT;  Surgeon: Cleon Gustin, MD;  Location: [facility];  Service: Urology;  Laterality: Right;  . CYSTOSCOPY WITH RETROGRADE PYELOGRAM, URETEROSCOPY AND STENT PLACEMENT Right 08/19/2017   Procedure: CYSTOSCOPY WITH RETROGRADE PYELOGRAM, URETEROSCOPY;  Surgeon: Cleon Gustin, MD;  Location: [facility];  Service: Urology;  Laterality: Right;  . DIAGNOSTIC LAPAROSCOPY    . FOOT SURGERY  2007   tumor removed from right foot/   . HOLMIUM LASER APPLICATION Right 02/08/5187   Procedure: HOLMIUM LASER APPLICATION;  Surgeon: Cleon Gustin, MD;  Location: [facility];  Service: Urology;  Laterality: Right;  . KNEE ARTHROSCOPY Left    x 3  . LAPAROTOMY  10/30/2011   Procedure: EXPLORATORY LAPAROTOMY;  Surgeon: Janie Morning, MD;  Location: [facility];  Service: Gynecology;  Laterality: N/A;  . mortons neuroma removed from foot    . SALPINGOOPHORECTOMY  10/30/2011   Procedure: SALPINGO OOPHERECTOMY;  Surgeon: Janie Morning, MD;  Location: [facility];  Service: Gynecology;  Laterality: Right;  RIGHT  . TONSILLECTOMY    . TOTAL KNEE ARTHROPLASTY Left 09/25/2017   Procedure: LEFT  TOTAL KNEE ARTHROPLASTY;  Surgeon: Latanya Maudlin, MD;  Location: [facility];  Service: Orthopedics;  Laterality: Left;  . TUBAL LIGATION    . TYMPANOSTOMY TUBE PLACEMENT      No current facility-administered medications for this encounter.   Current Outpatient Medications  Medication Sig Dispense Refill Last Dose  . albuterol (VENTOLIN HFA) 108 (90 Base) MCG/ACT inhaler Inhale 2 puffs into the lungs every 6 (six) hours as needed for wheezing or shortness of breath. 18 g 2   . atenolol (TENORMIN) 25 MG tablet TAKE 1 TABLET BY MOUTH EVERY MORNING FORMIGRAINES (Patient taking differently: Take 25 mg by mouth daily as needed (migraines).) 90 tablet 0   . Biotin 1 MG CAPS Take 1 mg by mouth daily.     . cholecalciferol (VITAMIN D) 1000 UNITS tablet Take 1,000 Units by mouth daily.     . Cyanocobalamin (VITAMIN B-12) 2500 MCG SUBL Place 2,500 mcg under the tongue daily.     . mirabegron ER (MYRBETRIQ) 25 MG TB24 tablet Take 25 mg by mouth daily.     . phentermine 37.5 MG capsule Take 1 capsule (37.5 mg total) by mouth every morning. 30 capsule 3   . promethazine (PHENERGAN) 25 MG tablet Take 1 tablet (25 mg total) every 8 (eight) hours as needed by mouth for refractory nausea / vomiting (if response to Zofran not sufficient). 30 tablet 0   . SYNTHROID 137 MCG tablet TAKE 1 TABLET BY MOUTH ONCE A DAY BEFOREBREAKFAST (SCHEDULE LAB APPT PER DR) (Patient taking differently: Take 137 mcg by mouth daily before breakfast.) 90 tablet 0   . tamsulosin (FLOMAX) 0.4 MG CAPS capsule Take 0.4 mg by mouth daily as needed (kidney stones).      Allergies  Allergen Reactions  . Adhesive [Tape] Swelling and Other (See Comments)    Reaction:  Eats in skin  . Ciprofloxacin Hives and Shortness Of Breath  . Dimethicone Hives and Shortness Of Breath  . Erythromycin Hives and Shortness Of Breath  . Latex Hives and Shortness Of Breath  . Other Hives, Shortness Of Breath and Other (See Comments)    Pt is allergic to  nitrile gloves.  Pt is allergic to antibacterial soaps- rash - states she only uses Dove soap  SCDs- patient states - had itching with this   . Penicillins Hives, Shortness Of Breath and Other (See Comments)    Has patient had a PCN reaction causing immediate rash, facial/tongue/throat swelling, SOB or lightheadedness with hypotension: Yes Has patient had a PCN reaction causing severe rash involving mucus membranes or skin necrosis: No Has patient had a PCN reaction that required hospitalization No Has patient had a PCN reaction occurring within the last 10 years: No If all of the above answers are "NO", then may proceed with Cephalosporin use.  Marland Kitchen Pineapple Anaphylaxis  . Red Dye Hives and Shortness Of Breath  . Shellfish  Allergy Hives and Shortness Of Breath  . Sulfonamide Derivatives Hives and Shortness Of Breath  . Alcohol-Sulfur [Sulfur] Swelling and Other (See Comments)    Reaction:  Localized swelling  With alcohol   . Aspirin Nausea And Vomiting  . Doxycycline Nausea And Vomiting  . Hibiclens [Chlorhexidine]     Rash   . Ceftin [Cefuroxime] Rash    Social History   Tobacco Use  . Smoking status: Never Smoker  . Smokeless tobacco: Never Used  Substance Use Topics  . Alcohol use: No    Family History  Problem Relation Age of Onset  . Alzheimer's disease Mother   . Cervical cancer Mother   . Diabetes Sister   . Heart attack Brother   . Hypertension Neg Hx        family hx  . Heart disease Neg Hx        family hx     Review of Systems  Constitutional: Negative.   HENT: Negative.   Eyes: Negative.   Respiratory: Negative.   Cardiovascular: Negative.   Gastrointestinal: Negative.   Genitourinary: Negative.   Musculoskeletal: Positive for joint pain.  Skin: Negative.   Neurological: Positive for headaches.  Endo/Heme/Allergies: Negative.   Psychiatric/Behavioral: Negative.      Objective:  Physical Exam Constitutional:      Appearance: She is  well-developed.  HENT:     Head: Normocephalic.  Eyes:     Pupils: Pupils are equal, round, and reactive to light.  Neck:     Thyroid: No thyromegaly.     Vascular: No JVD.     Trachea: No tracheal deviation.  Cardiovascular:     Rate and Rhythm: Normal rate and regular rhythm.     Pulses: Intact distal pulses.  Pulmonary:     Effort: Pulmonary effort is normal. No respiratory distress.     Breath sounds: Normal breath sounds. No wheezing.  Abdominal:     Palpations: Abdomen is soft.     Tenderness: There is no abdominal tenderness. There is no guarding.  Musculoskeletal:     Cervical back: Neck supple.     Left knee: Swelling present. No erythema or ecchymosis. Decreased range of motion. Tenderness present.  Lymphadenopathy:     Cervical: No cervical adenopathy.  Skin:    General: Skin is warm and dry.  Neurological:     Mental Status: She is alert and oriented to person, place, and time.  Psychiatric:        Mood and Affect: Mood and affect normal.       Labs:  Estimated body mass index is 26.95 kg/m as calculated from the following:   Height as of 11/15/20: 5\' 6"  (1.676 m).   Weight as of 11/15/20: 75.8 kg.   Imaging Review Plain radiographs demonstrate previous TKA of the left knee(s). The bone quality appears to be good for age and reported activity level.      Assessment/Plan:  Arthrofibrosis of the left total knee arthroplasty   The patient history, physical examination, clinical judgment of the provider and imaging studies are consistent with arthrofibrosis of the left total knee arthroplasty and  open excision of scar tissue of left total knee arthroplasty  is deemed medically necessary. The treatment options including medical management, injection therapy arthroscopy and arthroplasty were discussed at length. The risks and benefits of total knee arthroplasty were presented and reviewed. The risks due to aseptic loosening, infection, stiffness, patella  tracking problems, thromboembolic complications and other imponderables were discussed. The  patient acknowledged the explanation, agreed to proceed with the plan and consent was signed. Patient is being admitted for inpatient treatment for surgery, pain control, PT, OT, prophylactic antibiotics, VTE prophylaxis, progressive ambulation and ADL's and discharge planning. The patient is planning to be discharged home.   West Pugh [employer]   PA-C  11/22/2020, 9:22 PM

## 2020-11-23 MED ORDER — GENTAMICIN SULFATE 40 MG/ML IJ SOLN
5.0000 mg/kg | INTRAVENOUS | Status: AC
  Administered 2020-11-24: 330 mg via INTRAVENOUS
  Filled 2020-11-23: qty 8.25

## 2020-11-24 ENCOUNTER — Encounter (HOSPITAL_COMMUNITY): Payer: Self-pay | Admitting: Orthopedic Surgery

## 2020-11-24 ENCOUNTER — Other Ambulatory Visit: Payer: Self-pay

## 2020-11-24 ENCOUNTER — Ambulatory Visit (HOSPITAL_COMMUNITY): Payer: 59 | Admitting: Physician Assistant

## 2020-11-24 ENCOUNTER — Ambulatory Visit (HOSPITAL_COMMUNITY): Payer: 59 | Admitting: Anesthesiology

## 2020-11-24 ENCOUNTER — Encounter (HOSPITAL_COMMUNITY)
Admission: RE | Disposition: A | Discharge status: Home / Self Care | Payer: Self-pay | Source: Ambulatory Visit | Attending: Orthopedic Surgery

## 2020-11-24 ENCOUNTER — Ambulatory Visit (HOSPITAL_COMMUNITY)
Admission: RE | Admit: 2020-11-24 | Discharge: 2020-11-24 | Disposition: A | Discharge status: Home / Self Care | Payer: 59 | Source: Ambulatory Visit | Attending: Orthopedic Surgery | Admitting: Orthopedic Surgery

## 2020-11-24 DIAGNOSIS — M24662 Ankylosis, left knee: Secondary | ICD-10-CM | POA: Diagnosis present

## 2020-11-24 DIAGNOSIS — Z96652 Presence of left artificial knee joint: Secondary | ICD-10-CM | POA: Insufficient documentation

## 2020-11-24 DIAGNOSIS — M2352 Chronic instability of knee, left knee: Secondary | ICD-10-CM | POA: Diagnosis not present

## 2020-11-24 DIAGNOSIS — Z79890 Hormone replacement therapy (postmenopausal): Secondary | ICD-10-CM | POA: Diagnosis not present

## 2020-11-24 DIAGNOSIS — Z79899 Other long term (current) drug therapy: Secondary | ICD-10-CM | POA: Insufficient documentation

## 2020-11-24 DIAGNOSIS — M25862 Other specified joint disorders, left knee: Secondary | ICD-10-CM | POA: Diagnosis present

## 2020-11-24 HISTORY — PX: SCAR DEBRIDEMENT OF TOTAL KNEE: SHX6544

## 2020-11-24 LAB — TYPE AND SCREEN
ABO/RH(D): O POS
Antibody Screen: NEGATIVE

## 2020-11-24 SURGERY — REVISION, SCAR, KNEE
Anesthesia: Regional | Site: Knee | Laterality: Left

## 2020-11-24 MED ORDER — POLYETHYLENE GLYCOL 3350 17 G PO PACK: 17.0000 g | PACK | Freq: Two times a day (BID) | ORAL | 0 refills | Status: DC

## 2020-11-24 MED ORDER — ONDANSETRON HCL 4 MG/2ML IJ SOLN
INTRAMUSCULAR | Status: AC
  Filled 2020-11-24: qty 2

## 2020-11-24 MED ORDER — HYDROCODONE-ACETAMINOPHEN 7.5-325 MG PO TABS: 1.0000 | ORAL_TABLET | ORAL | 0 refills | Status: DC | PRN

## 2020-11-24 MED ORDER — STERILE WATER FOR IRRIGATION IR SOLN
Status: DC | PRN
  Administered 2020-11-24: 1000 mL

## 2020-11-24 MED ORDER — ONDANSETRON HCL 4 MG/2ML IJ SOLN
INTRAMUSCULAR | Status: DC | PRN
  Administered 2020-11-24: 4 mg via INTRAVENOUS

## 2020-11-24 MED ORDER — METHOCARBAMOL 500 MG IVPB - SIMPLE MED
INTRAVENOUS | Status: AC
  Filled 2020-11-24: qty 50

## 2020-11-24 MED ORDER — VANCOMYCIN HCL IN DEXTROSE 1-5 GM/200ML-% IV SOLN
1000.0000 mg | INTRAVENOUS | Status: AC
  Administered 2020-11-24: 1000 mg via INTRAVENOUS
  Filled 2020-11-24: qty 200

## 2020-11-24 MED ORDER — 0.9 % SODIUM CHLORIDE (POUR BTL) OPTIME
TOPICAL | Status: DC | PRN
  Administered 2020-11-24: 1000 mL

## 2020-11-24 MED ORDER — TOBRAMYCIN SULFATE 1.2 G IJ SOLR
INTRAMUSCULAR | Status: AC
  Filled 2020-11-24: qty 1.2

## 2020-11-24 MED ORDER — MEPIVACAINE HCL (PF) 2 % IJ SOLN
INTRAMUSCULAR | Status: DC | PRN
  Administered 2020-11-24: 2 mL via INTRATHECAL

## 2020-11-24 MED ORDER — TRANEXAMIC ACID-NACL 1000-0.7 MG/100ML-% IV SOLN
1000.0000 mg | INTRAVENOUS | Status: AC
  Administered 2020-11-24: 1000 mg via INTRAVENOUS
  Filled 2020-11-24: qty 100

## 2020-11-24 MED ORDER — RIVAROXABAN 10 MG PO TABS: 10.0000 mg | ORAL_TABLET | Freq: Every day | ORAL | 0 refills | Status: DC

## 2020-11-24 MED ORDER — DEXAMETHASONE SODIUM PHOSPHATE 10 MG/ML IJ SOLN
INTRAMUSCULAR | Status: DC | PRN
  Administered 2020-11-24: 5 mg

## 2020-11-24 MED ORDER — PROPOFOL 10 MG/ML IV BOLUS
INTRAVENOUS | Status: DC | PRN
  Administered 2020-11-24: 20 mg via INTRAVENOUS
  Administered 2020-11-24: 30 mg via INTRAVENOUS

## 2020-11-24 MED ORDER — DEXAMETHASONE SODIUM PHOSPHATE 10 MG/ML IJ SOLN
10.0000 mg | Freq: Once | INTRAMUSCULAR | Status: AC
  Administered 2020-11-24: 10 mg via INTRAVENOUS

## 2020-11-24 MED ORDER — PROPOFOL 500 MG/50ML IV EMUL
INTRAVENOUS | Status: DC | PRN
  Administered 2020-11-24: 100 ug/kg/min via INTRAVENOUS

## 2020-11-24 MED ORDER — DOCUSATE SODIUM 100 MG PO CAPS: 100.0000 mg | ORAL_CAPSULE | Freq: Two times a day (BID) | ORAL | 0 refills | Status: DC

## 2020-11-24 MED ORDER — LACTATED RINGERS IV BOLUS
500.0000 mL | Freq: Once | INTRAVENOUS | Status: AC
  Administered 2020-11-24: 500 mL via INTRAVENOUS

## 2020-11-24 MED ORDER — HYDROCODONE-ACETAMINOPHEN 7.5-325 MG PO TABS
ORAL_TABLET | ORAL | Status: AC
  Filled 2020-11-24: qty 1

## 2020-11-24 MED ORDER — FENTANYL CITRATE (PF) 100 MCG/2ML IJ SOLN
INTRAMUSCULAR | Status: AC
  Filled 2020-11-24: qty 2

## 2020-11-24 MED ORDER — FENTANYL CITRATE (PF) 100 MCG/2ML IJ SOLN
INTRAMUSCULAR | Status: AC
  Administered 2020-11-24: 50 ug
  Filled 2020-11-24: qty 2

## 2020-11-24 MED ORDER — DEXAMETHASONE SODIUM PHOSPHATE 10 MG/ML IJ SOLN
INTRAMUSCULAR | Status: AC
  Filled 2020-11-24: qty 1

## 2020-11-24 MED ORDER — LACTATED RINGERS IV BOLUS
250.0000 mL | Freq: Once | INTRAVENOUS | Status: AC
  Administered 2020-11-24: 250 mL via INTRAVENOUS

## 2020-11-24 MED ORDER — MIDAZOLAM HCL 2 MG/2ML IJ SOLN
INTRAMUSCULAR | Status: AC
  Administered 2020-11-24: 2 mg
  Filled 2020-11-24: qty 2

## 2020-11-24 MED ORDER — ROPIVACAINE HCL 5 MG/ML IJ SOLN
INTRAMUSCULAR | Status: DC | PRN
  Administered 2020-11-24: 20 mL via PERINEURAL

## 2020-11-24 MED ORDER — LIDOCAINE HCL (PF) 2 % IJ SOLN
INTRAMUSCULAR | Status: AC
  Filled 2020-11-24: qty 5

## 2020-11-24 MED ORDER — BUPIVACAINE HCL (PF) 0.25 % IJ SOLN
INTRAMUSCULAR | Status: DC | PRN
  Administered 2020-11-24: 30 mL

## 2020-11-24 MED ORDER — TRANEXAMIC ACID-NACL 1000-0.7 MG/100ML-% IV SOLN
1000.0000 mg | Freq: Once | INTRAVENOUS | Status: AC
  Administered 2020-11-24: 1000 mg via INTRAVENOUS

## 2020-11-24 MED ORDER — VANCOMYCIN HCL 1000 MG IV SOLR
INTRAVENOUS | Status: AC
  Filled 2020-11-24: qty 1000

## 2020-11-24 MED ORDER — TRANEXAMIC ACID-NACL 1000-0.7 MG/100ML-% IV SOLN
INTRAVENOUS | Status: AC
  Filled 2020-11-24: qty 100

## 2020-11-24 MED ORDER — SODIUM CHLORIDE (PF) 0.9 % IJ SOLN
INTRAMUSCULAR | Status: DC | PRN
  Administered 2020-11-24: 30 mL

## 2020-11-24 MED ORDER — KETOROLAC TROMETHAMINE 30 MG/ML IJ SOLN
INTRAMUSCULAR | Status: DC | PRN
  Administered 2020-11-24: 30 mg

## 2020-11-24 MED ORDER — FENTANYL CITRATE (PF) 100 MCG/2ML IJ SOLN
INTRAMUSCULAR | Status: AC
  Administered 2020-11-24: 25 ug via INTRAVENOUS
  Filled 2020-11-24: qty 2

## 2020-11-24 MED ORDER — PROMETHAZINE HCL 25 MG PO TABS: 12.5000 mg | ORAL_TABLET | Freq: Four times a day (QID) | ORAL | 1 refills | Status: DC | PRN

## 2020-11-24 MED ORDER — APREPITANT 40 MG PO CAPS
ORAL_CAPSULE | ORAL | Status: AC
  Administered 2020-11-24: 40 mg via ORAL
  Filled 2020-11-24: qty 1

## 2020-11-24 MED ORDER — HYDROCODONE-ACETAMINOPHEN 7.5-325 MG PO TABS
1.0000 | ORAL_TABLET | ORAL | Status: DC | PRN
  Administered 2020-11-24 (×2): 1 via ORAL

## 2020-11-24 MED ORDER — ORAL CARE MOUTH RINSE: 15.0000 mL | Freq: Once | OROMUCOSAL | Status: DC

## 2020-11-24 MED ORDER — POVIDONE-IODINE 10 % EX SWAB
2.0000 "application " | Freq: Once | CUTANEOUS | Status: AC
  Administered 2020-11-24: 2 via TOPICAL

## 2020-11-24 MED ORDER — VANCOMYCIN HCL IN DEXTROSE 1-5 GM/200ML-% IV SOLN: 1000.0000 mg | Freq: Two times a day (BID) | INTRAVENOUS | Status: DC

## 2020-11-24 MED ORDER — LACTATED RINGERS IV SOLN: INTRAVENOUS | Status: DC

## 2020-11-24 MED ORDER — ACETAMINOPHEN 500 MG PO TABS
1000.0000 mg | ORAL_TABLET | Freq: Once | ORAL | Status: AC
  Administered 2020-11-24: 1000 mg via ORAL
  Filled 2020-11-24: qty 2

## 2020-11-24 MED ORDER — KETOROLAC TROMETHAMINE 30 MG/ML IJ SOLN
INTRAMUSCULAR | Status: AC
  Filled 2020-11-24: qty 1

## 2020-11-24 MED ORDER — METHOCARBAMOL 500 MG IVPB - SIMPLE MED
500.0000 mg | Freq: Four times a day (QID) | INTRAVENOUS | Status: DC | PRN
  Administered 2020-11-24: 500 mg via INTRAVENOUS

## 2020-11-24 MED ORDER — FERROUS SULFATE 325 (65 FE) MG PO TABS: 325.0000 mg | ORAL_TABLET | Freq: Three times a day (TID) | ORAL | 0 refills | Status: DC

## 2020-11-24 MED ORDER — PROMETHAZINE HCL 25 MG/ML IJ SOLN
INTRAMUSCULAR | Status: AC
  Filled 2020-11-24: qty 1

## 2020-11-24 MED ORDER — APREPITANT 40 MG PO CAPS: 40.0000 mg | ORAL_CAPSULE | Freq: Once | ORAL | Status: AC

## 2020-11-24 MED ORDER — CHLORHEXIDINE GLUCONATE 0.12 % MT SOLN: 15.0000 mL | Freq: Once | OROMUCOSAL | Status: DC

## 2020-11-24 MED ORDER — METHOCARBAMOL 500 MG PO TABS: 500.0000 mg | ORAL_TABLET | Freq: Four times a day (QID) | ORAL | 0 refills | Status: DC | PRN

## 2020-11-24 MED ORDER — PROPOFOL 1000 MG/100ML IV EMUL
INTRAVENOUS | Status: AC
  Filled 2020-11-24: qty 100

## 2020-11-24 MED ORDER — DEXAMETHASONE SODIUM PHOSPHATE 10 MG/ML IJ SOLN
INTRAMUSCULAR | Status: DC | PRN
  Administered 2020-11-24: 4 mg via INTRAVENOUS

## 2020-11-24 MED ORDER — BUPIVACAINE HCL 0.25 % IJ SOLN
INTRAMUSCULAR | Status: AC
  Filled 2020-11-24: qty 1

## 2020-11-24 MED ORDER — METHOCARBAMOL 500 MG PO TABS: 500.0000 mg | ORAL_TABLET | Freq: Four times a day (QID) | ORAL | Status: DC | PRN

## 2020-11-24 MED ORDER — FENTANYL CITRATE (PF) 100 MCG/2ML IJ SOLN
25.0000 ug | INTRAMUSCULAR | Status: DC | PRN
  Administered 2020-11-24 (×2): 50 ug via INTRAVENOUS

## 2020-11-24 MED ORDER — HYDROCODONE-ACETAMINOPHEN 5-325 MG PO TABS: 1.0000 | ORAL_TABLET | ORAL | Status: DC | PRN

## 2020-11-24 MED ORDER — SODIUM CHLORIDE (PF) 0.9 % IJ SOLN
INTRAMUSCULAR | Status: AC
  Filled 2020-11-24: qty 30

## 2020-11-24 SURGICAL SUPPLY — 54 items
ADH SKN CLS APL DERMABOND .7 (GAUZE/BANDAGES/DRESSINGS) ×1
BAG SPEC THK2 15X12 ZIP CLS (MISCELLANEOUS)
BAG ZIPLOCK 12X15 (MISCELLANEOUS) IMPLANT
BLADE SAW SGTL 11.0X1.19X90.0M (BLADE) IMPLANT
BLADE SAW SGTL 13.0X1.19X90.0M (BLADE) ×2 IMPLANT
BLADE SURG SZ10 CARB STEEL (BLADE) ×4 IMPLANT
BNDG CMPR MED 10X6 ELC LF (GAUZE/BANDAGES/DRESSINGS) ×1
BNDG ELASTIC 6X10 VLCR STRL LF (GAUZE/BANDAGES/DRESSINGS) ×1 IMPLANT
BNDG ELASTIC 6X5.8 VLCR STR LF (GAUZE/BANDAGES/DRESSINGS) ×2 IMPLANT
BOWL SMART MIX CTS (DISPOSABLE) ×2 IMPLANT
COVER WAND RF STERILE (DRAPES) IMPLANT
CUFF TOURN SGL QUICK 34 (TOURNIQUET CUFF) ×2
CUFF TRNQT CYL 34X4.125X (TOURNIQUET CUFF) ×1 IMPLANT
DECANTER SPIKE VIAL GLASS SM (MISCELLANEOUS) ×4 IMPLANT
DERMABOND ADVANCED (GAUZE/BANDAGES/DRESSINGS) ×1
DERMABOND ADVANCED .7 DNX12 (GAUZE/BANDAGES/DRESSINGS) ×1 IMPLANT
DRAPE U-SHAPE 47X51 STRL (DRAPES) ×2 IMPLANT
DRESSING AQUACEL AG SP 3.5X10 (GAUZE/BANDAGES/DRESSINGS) ×1 IMPLANT
DRSG ADAPTIC 3X8 NADH LF (GAUZE/BANDAGES/DRESSINGS) ×2 IMPLANT
DRSG AQUACEL AG SP 3.5X10 (GAUZE/BANDAGES/DRESSINGS) ×2
DURAPREP 26ML APPLICATOR (WOUND CARE) ×4 IMPLANT
ELECT REM PT RETURN 15FT ADLT (MISCELLANEOUS) ×2 IMPLANT
GAUZE SPONGE 4X4 12PLY STRL (GAUZE/BANDAGES/DRESSINGS) ×1 IMPLANT
GLOVE BIOGEL PI IND STRL 8.5 (GLOVE) IMPLANT
GLOVE BIOGEL PI INDICATOR 8.5 (GLOVE)
GLOVE ECLIPSE 8.0 STRL XLNG CF (GLOVE) ×2 IMPLANT
GLOVE ORTHO TXT STRL SZ7.5 (GLOVE) ×2 IMPLANT
GLOVE SURG ENC MOIS LTX SZ6 (GLOVE) IMPLANT
GLOVE SURG UNDER POLY LF SZ6.5 (GLOVE) IMPLANT
GLOVE SURG UNDER POLY LF SZ7.5 (GLOVE) ×2 IMPLANT
GOWN STRL REUS W/TWL 2XL LVL3 (GOWN DISPOSABLE) IMPLANT
GOWN STRL REUS W/TWL LRG LVL3 (GOWN DISPOSABLE) ×2 IMPLANT
HANDPIECE INTERPULSE COAX TIP (DISPOSABLE) ×2
HOLDER FOLEY CATH W/STRAP (MISCELLANEOUS) IMPLANT
INSERT PFC SIG STB SZ 2.5 15.5 (Knees) ×1 IMPLANT
KIT TURNOVER KIT A (KITS) IMPLANT
MANIFOLD NEPTUNE II (INSTRUMENTS) ×2 IMPLANT
NDL SAFETY ECLIPSE 18X1.5 (NEEDLE) IMPLANT
NEEDLE HYPO 18GX1.5 SHARP (NEEDLE)
NS IRRIG 1000ML POUR BTL (IV SOLUTION) ×2 IMPLANT
PACK TOTAL KNEE CUSTOM (KITS) ×2 IMPLANT
PENCIL SMOKE EVACUATOR (MISCELLANEOUS) IMPLANT
PROTECTOR NERVE ULNAR (MISCELLANEOUS) ×2 IMPLANT
SET HNDPC FAN SPRY TIP SCT (DISPOSABLE) ×1 IMPLANT
SET PAD KNEE POSITIONER (MISCELLANEOUS) ×2 IMPLANT
SUT MNCRL AB 4-0 PS2 18 (SUTURE) ×2 IMPLANT
SUT STRATAFIX PDS+ 0 24IN (SUTURE) ×3 IMPLANT
SUT VIC AB 1 CT1 36 (SUTURE) ×2 IMPLANT
SUT VIC AB 2-0 CT1 27 (SUTURE) ×6
SUT VIC AB 2-0 CT1 TAPERPNT 27 (SUTURE) ×3 IMPLANT
SYR 3ML LL SCALE MARK (SYRINGE) ×2 IMPLANT
TRAY FOLEY MTR SLVR 16FR STAT (SET/KITS/TRAYS/PACK) ×2 IMPLANT
WATER STERILE IRR 1000ML POUR (IV SOLUTION) ×4 IMPLANT
WRAP KNEE MAXI GEL POST OP (GAUZE/BANDAGES/DRESSINGS) ×2 IMPLANT

## 2020-11-24 NOTE — Anesthesia Preprocedure Evaluation (Addendum)
Anesthesia Evaluation  Patient identified by MRN, date of birth, ID band Patient awake    Reviewed: Allergy & Precautions, NPO status , Patient's Chart, lab work & pertinent test results, reviewed documented beta blocker date and time   History of Anesthesia Complications (+) PONV  Airway Mallampati: III  TM Distance: >3 FB Neck ROM: Full    Dental  (+) Chipped, Poor Dentition, Dental Advisory Given,    Pulmonary asthma ,    Pulmonary exam normal breath sounds clear to auscultation       Cardiovascular hypertension, Pt. on medications and Pt. on home beta blockers Normal cardiovascular exam Rhythm:Regular Rate:Normal     Neuro/Psych  Headaches, negative psych ROS   GI/Hepatic negative GI ROS, Neg liver ROS,   Endo/Other  Hypothyroidism   Renal/GU negative Renal ROS  negative genitourinary   Musculoskeletal  (+) Arthritis ,   Abdominal   Peds  Hematology negative hematology ROS (+)   Anesthesia Other Findings   Reproductive/Obstetrics                            Anesthesia Physical Anesthesia Plan  ASA: II  Anesthesia Plan: Spinal and Regional   Post-op Pain Management:  Regional for Post-op pain   Induction:   PONV Risk Score and Plan: 3 and Treatment may vary due to age or medical condition, Midazolam, Propofol infusion, Ondansetron, Dexamethasone and Aprepitant  Airway Management Planned: Natural Airway  Additional Equipment:   Intra-op Plan:   Post-operative Plan:   Informed Consent: I have reviewed the patients History and Physical, chart, labs and discussed the procedure including the risks, benefits and alternatives for the proposed anesthesia with the patient or authorized representative who has indicated his/her understanding and acceptance.     Dental advisory given  Plan Discussed with: CRNA  Anesthesia Plan Comments:        Anesthesia Quick  Evaluation

## 2020-11-24 NOTE — Anesthesia Postprocedure Evaluation (Signed)
Anesthesia Post Note  Patient: Katelyn [name]  Procedure(s) Performed: Open excision of scar tissue of left total knee arthroplasty with polyethylene revision (Left Knee)     Patient location during evaluation: PACU Anesthesia Type: Regional and Spinal Level of consciousness: oriented and awake and alert Pain management: pain level controlled Vital Signs Assessment: post-procedure vital signs reviewed and stable Respiratory status: spontaneous breathing, respiratory function stable and patient connected to nasal cannula oxygen Cardiovascular status: blood pressure returned to baseline and stable Postop Assessment: no headache, no backache and no apparent nausea or vomiting Anesthetic complications: no   No complications documented.  Last Vitals:  Vitals:   11/24/20 1400 11/24/20 1415  BP: (!) 156/73 131/75  Pulse:    Resp:    Temp:    SpO2:      Last Pain:  Vitals:   11/24/20 1200  TempSrc:   PainSc: 10-Worst pain ever                 Malon Siddall L Alexandria Shiflett

## 2020-11-24 NOTE — Transfer of Care (Signed)
Immediate Anesthesia Transfer of Care Note  Patient: Katelyn [name]  Procedure(s) Performed: Open excision of scar tissue of left total knee arthroplasty with polyethylene revision (Left Knee)  Patient Location: PACU  Anesthesia Type:Spinal  Level of Consciousness: awake  Airway & Oxygen Therapy: Patient Spontanous Breathing and Patient connected to face mask oxygen  Post-op Assessment: Report given to RN and Post -op Vital signs reviewed and stable  Post vital signs: Reviewed and stable  Last Vitals:  Vitals Value Taken Time  BP 135/85 11/24/20 1018  Temp    Pulse 70 11/24/20 1020  Resp 17 11/24/20 1020  SpO2 98 % 11/24/20 1020  Vitals shown include unvalidated device data.  Last Pain:  Vitals:   11/24/20 0640  TempSrc:   PainSc: 7       Patients Stated Pain Goal: 4 (61/47/09 2957)  Complications: No complications documented.

## 2020-11-24 NOTE — Discharge Instructions (Signed)

## 2020-11-24 NOTE — Evaluation (Signed)
Physical Therapy Evaluation Patient Details Name: Katelyn [name] MRN: [id number] DOB: Oct 08, 1956 Today's Date: 11/24/2020   History of Present Illness  Patient is 65 y.o. female s/p Lt TKR (scar excision with polyethylene revision) on 11/25/19 with PMH significant for athma, OA, anemia, hypothyroidism, Lt TKA in 2018.   Katelyn [name] is a 65 y.o. female POD 0 s/p Lt TKR. Patient reports indepndence with mobility at baseline. Patient is now limited by functional impairments (see PT problem list below) and requires min guard/supervision for transfers and gait with RW. Patient was able to ambulate ~120 feet with RW and supervision and cues for safe walker management. Patient educated on safe sequencing for stair mobility and verbalized safe guarding position for people assisting with mobility. She c/o nausea throughout session however VSS throughout. Patient instructed in exercises to facilitate ROM and circulation. Patient will benefit from continued skilled PT interventions to address impairments and progress towards PLOF. Patient has met mobility goals at adequate level for discharge home; will continue to follow if pt continues acute stay to progress towards Mod I goals.       11/24/20 1300  PT Visit Information  Last PT Received On 11/24/20  Assistance Needed +1  History of Present Illness Patient is 65 y.o. female s/p Lt TKR (scar excision with polyethylene revision) on 11/25/19 with PMH significant for athma, OA, anemia, hypothyroidism, Lt TKA in 2018.  Precautions  Precautions Fall  Restrictions  Weight Bearing Restrictions No  Other Position/Activity Restrictions WBAT  Home Living  Family/patient expects to be discharged to: Private residence  Living Arrangements Spouse/significant other  Available Help at Discharge Family  Type of [place] to enter  Entrance Stairs-Number of Steps [place] One level  Bathroom  Shower/Tub Tub/shower unit  Corporate treasurer Yes  [place] - 2 wheels;Cane - single point;Shower seat;BSC  Prior Function  Level of Independence Independent  Communication  Communication No difficulties  Pain Assessment  Pain Assessment Faces  Faces Pain Scale 2  Pain Location Lt knee  Pain Descriptors / Indicators Discomfort  Pain Intervention(s) Limited activity within patient's tolerance;Monitored during session;Repositioned  Cognition  Arousal/Alertness Awake/alert  Behavior During Therapy WFL for tasks assessed/performed  Overall Cognitive Status Within Functional Limits for tasks assessed  Upper Extremity Assessment  Upper Extremity Assessment Overall WFL for tasks assessed  Lower Extremity Assessment  Lower Extremity Assessment LLE deficits/detail  LLE Deficits / Details good quad activaiton, no extensor lag with SLR  LLE Sensation WNL  LLE Coordination WNL  Cervical / Trunk Assessment  Cervical / Trunk Assessment Normal  Bed Mobility  Overal bed mobility Needs Assistance  Bed Mobility Supine to Sit;Sit to Supine  Supine to sit Supervision  Sit to supine Min guard;Supervision  General bed mobility comments supervision for safety and extra time needed to sit up EOB, no assist for Lt LE required.  Transfers  Overall transfer level Needs assistance  Equipment used Rolling walker (2 wheeled)  Transfers Sit to/from Stand  Sit to Stand Min guard;Supervision  General transfer comment guard/supervision to rise from EOB, cues for hand placement on RW, pt steady standing.  Ambulation/Gait  Ambulation/Gait assistance Min guard;Supervision  Gait Distance (Feet) 120 Feet  Assistive device Rolling walker (2 wheeled)  Gait Pattern/deviations Step-to pattern;Step-through pattern;Decreased stride length;Decreased weight shift to left  General Gait Details cues for step patterna dn proximity to RW, pt progressed to step  through pattern  with no buckling at Lt knee and no LOB.  Gait velocity decr  Stairs Yes  Stairs assistance Min guard  Stair Management Two rails;Step to pattern;Forwards  Number of Stairs 3  General stair comments cues for step pattern and safe guarding for pt's family to procide. no overt LOB noted and pt verbalized understanding.  Balance  Overall balance assessment Mild deficits observed, not formally tested  Exercises  Exercises Total Joint  Total Joint Exercises  Ankle Circles/Pumps AROM;Both;15 reps;Supine  Quad Sets AROM;Left;5 reps;Supine  Short Arc Quad AROM;Left;Supine;Other reps (comment) (2)  Heel Slides AROM;Left;Supine;Other reps (comment) (2)  Hip ABduction/ADduction AROM;Left;Supine;Other reps (comment) (2)  PT - End of Session  Equipment Utilized During Treatment Gait belt  Activity Tolerance Patient tolerated treatment well  Patient left with call bell/phone within reach;in bed  Nurse Communication Mobility status  PT Assessment  PT Recommendation/Assessment Patient needs continued PT services  PT Visit Diagnosis Muscle weakness (generalized) (M62.81);Difficulty in walking, not elsewhere classified (R26.2)  PT Problem List Decreased strength;Decreased range of motion;Decreased activity tolerance;Decreased balance;Decreased mobility;Decreased knowledge of use of DME;Decreased knowledge of precautions  PT Plan  PT Frequency (ACUTE ONLY) 7X/week  PT Treatment/Interventions (ACUTE ONLY) DME instruction;Gait training;Stair training;Functional mobility training;Therapeutic activities;Therapeutic exercise;Balance training;Patient/family education  AM-PAC PT "6 Clicks" Mobility Outcome Measure (Version 2)  Help needed turning from your back to your side while in a flat bed without using bedrails? 4  Help needed moving from lying on your back to sitting on the side of a flat bed without using bedrails? 3  Help needed moving to and from a bed to a chair (including a wheelchair)? 3  Help  needed standing up from a chair using your arms (e.g., wheelchair or bedside chair)? 3  Help needed to walk in hospital room? 3  Help needed climbing 3-5 steps with a railing?  3  6 Click Score 19  Consider Recommendation of Discharge To: Home with [facility]  PT Recommendation  Follow Up Recommendations Follow surgeon's recommendation for DC plan and follow-up therapies;Outpatient PT  PT equipment None recommended by PT  Individuals Consulted  Consulted and Agree with Results and Recommendations Patient  Acute Rehab PT Goals  Patient Stated Goal get home today  PT Goal Formulation With patient  Time For Goal Achievement 12/01/20  Potential to Achieve Goals Good  PT Time Calculation  PT Start Time (ACUTE ONLY) 1344  PT Stop Time (ACUTE ONLY) 1417  PT Time Calculation (min) (ACUTE ONLY) 33 min  PT General Charges  $$ ACUTE PT VISIT 1 Visit  PT Evaluation  $PT Eval Low Complexity 1 Low  PT Treatments  $Gait Training 8-22 mins  Written Expression  Dominant Hand Right     Verner Mould, DPT Acute Rehabilitation Services Office [phone] Pager [phone]    Jacques Navy 11/24/2020, 7:32 PM

## 2020-11-24 NOTE — Op Note (Signed)
NAME: Katelyn, [name] MEDICAL RECORD [id number] ACCOUNT [id number] DATE OF BIRTH:17-Nov-1955 FACILITY: [facility] LOCATION: [facility] PHYSICIAN:Ellyson Rarick DAlvan Dame, MD  OPERATIVE REPORT  DATE OF PROCEDURE:  11/24/2020  PREOPERATIVE DIAGNOSIS:  History of left total knee arthroplasty with patellar clunk, excessive scarring around the patella with mechanical symptoms.  POSTOPERATIVE DIAGNOSES: 1.  History of left total knee arthroplasty with patellar clunk, excessive scarring around the patella with mechanical symptoms. 2.  Ligament instability with slight hyperextension noted under anesthesia.  PROCEDURES: 1.  Open excisional scar debridement of the left knee focused in the parapatellar region. 2.  Polyethylene revision changing her polyethylene from a size 2.5 x 12.5 mm posterior stabilized insert to a 2.5 x 15 mm insert.  SURGEON:  Paralee Cancel, MD  ASSISTANT:  Danae Orleans PA-C  Note that Mr. Katelyn [name] was present for the entirety of the case from preoperative positioning, perioperative management of the operative extremity, general facilitation of the case, and primary wound closure.  ANESTHESIA:  Regional plus spinal.  SPECIMENS:  None.  COMPLICATIONS:  None.  TOURNIQUET:  Up for 17 minutes at 250 mmHg.  ESTIMATED BLOOD LOSS:  Minimal.  INDICATIONS FOR PROCEDURE:  The patient is a 65 year old female with a history of a left total knee replacement in 2019.  She was referred for evaluation by her primary surgeon with mechanical based symptoms.  On examination, she was noted to have  significant crepitation and pain over the anterior aspect of her knee.  She wished to proceed with surgical intervention.  I discussed this being done as an open versus an arthroscopic procedure.  My preference has been to open the knee up to better  evaluate the peripatellar soft tissues.  Based on her radiographs, at that time I wanted to also be able to evaluate her patella potentially needing revision  as well as to evaluate her knee intraoperatively for potential need for revision of her  polyethylene even at 2 years out to update the polyethylene and prevent long-term need for revision surgery with polyethylene wear.  Consent was obtained for management of these issues and for pain control.  The risks of recurrent scarring and need for  future surgery discussed as being fairly minimal risk of DVT and infection also reviewed.  Consent was obtained for benefit of pain relief.  DESCRIPTION OF PROCEDURE:  The patient was brought to the operative theater.  Once adequate anesthesia, preoperative antibiotics, vancomycin and gentamicin, administered.  She was positioned supine with a left thigh tourniquet placed.  The left lower  extremity was prepped and draped in sterile fashion.  A timeout was performed identifying the patient, the planned procedure and extremity.  The leg was exsanguinated, and tourniquet elevated to 250 mmHg.  I excised a portion of her old incision.  Soft  tissue planes were created.  I then did an arthrotomy encountering clear yellow synovial fluid.  Once I had the knee opened, we evaluated the patella noting as anticipated an excessive amount of scar around the patella.  The first part of this procedure  was addressed performing a scar debridement using the Bovie circumferentially around the patella to the level of exposed quadriceps and patellar tendons.  Based on her radiographic assessment preoperatively particularly on her sunrise view, there was  some concern about asymmetric cut in the orientation of the patella.  Evaluating her patella height intraoperatively, we identified that the patella height combined with her retained native patella and the button was 22 mm.  I did not think  that this was  adequate enough bone stock to make an adjustment without potentially leading to complications with the patella down the road.  I thus elected to keep this in place.  We examined her knee  under anesthesia and found there to be slight hyperextension with  play of the medial and lateral collateral ligaments.  Given these findings, I removed her old polyethylene.  This allowed me to perform further debridement of scar tissue over the lateral and posterolateral aspect of her knee.  I then performed a trial  reduction with a 15 mm insert and found the knee came to full extension and the patella tracked through the trochlea without application of pressure without any mechanical shifting.  Given these findings, we opened a 15 mm insert to match her 2.5 femur  and placed in the tibia.  At this point, we injected the medial synovial capsular junction of the knee with 0.25% Marcaine with epinephrine, 1 mL of Toradol and saline.  The knee was irrigated with normal saline solution.  The tourniquet was let down  after 17 minutes.  The extensor mechanism was then reapproximated using #1 Vicryl and #1 Stratafix suture.  The remainder of the wound was closed with 2-0 Vicryl and a running Monocryl stitch.  The knee was cleaned, dried, and dressed sterilely using  surgical glue and a bulky dressing due to adhesive allergy.  We will see her back in the office in 2 weeks to determine whether or not physical therapy is necessary as I would anticipate that her recovery would be easier than her primary surgery.  IN/NUANCE  D:11/24/2020 T:11/24/2020 JOB:[id number]

## 2020-11-24 NOTE — Progress Notes (Signed)
AssistedDr. Hulan Fray with left, ultrasound guided, adductor canal block. Side rails up, monitors on throughout procedure. See vital signs in flow sheet. Tolerated Procedure well.

## 2020-11-24 NOTE — Anesthesia Procedure Notes (Signed)
Spinal  Patient location during procedure: OR Start time: 11/24/2020 8:50 AM End time: 11/24/2020 8:55 AM Staffing Performed: resident/CRNA  Resident/CRNA: Milford Cage, CRNA Preanesthetic Checklist Completed: patient identified, IV checked, site marked, risks and benefits discussed, surgical consent, monitors and equipment checked, pre-op evaluation and timeout performed Spinal Block Patient position: sitting Prep: DuraPrep Patient monitoring: heart rate, cardiac monitor, continuous pulse ox and blood pressure Approach: midline Location: L3-4 Injection technique: single-shot Needle Needle type: Sprotte  Needle gauge: 24 G Needle length: 9 cm Assessment Sensory level: T8 Additional Notes IV functioning, monitors applied to pt. Expiration date of kit checked and confirmed to be in date. Sterile prep and drape, hand hygiene and sterile gloved used. Pt was positioned and spine was prepped in sterile fashion. Skin was anesthetized with lidocaine. Free flow of clear CSF obtained prior to injecting local anesthetic into CSF x 1 attempt. Spinal needle aspirated freely following injection. Needle was carefully withdrawn, and pt tolerated procedure well. Loss of motor and sensory on exam post injection.

## 2020-11-24 NOTE — Anesthesia Procedure Notes (Signed)
Anesthesia Regional Block: Adductor canal block   Pre-Anesthetic Checklist: ,, timeout performed, Correct Patient, Correct Site, Correct Laterality, Correct Procedure, Correct Position, site marked, Risks and benefits discussed,  Surgical consent,  Pre-op evaluation,  At surgeon's request and post-op pain management  Laterality: Left  Prep: Maximum Sterile Barrier Precautions used, chloraprep       Needles:  Injection technique: Single-shot  Needle Type: Echogenic Stimulator Needle     Needle Length: 9cm  Needle Gauge: 22     Additional Needles:   Procedures:,,,, ultrasound used (permanent image in chart),,,,  Narrative:  Start time: 11/24/2020 7:50 AM End time: 11/24/2020 7:57 AM Injection made incrementally with aspirations every 5 mL.  Performed by: Personally  Anesthesiologist: Freddrick March, MD  Additional Notes: Monitors applied. No increased pain on injection. No increased resistance to injection. Injection made in 5cc increments. Good needle visualization. Patient tolerated procedure well.

## 2020-11-24 NOTE — Interval H&P Note (Signed)
History and Physical Interval Note:  11/24/2020 7:11 AM  Katelyn [name]  has presented today for surgery, with the diagnosis of Status post left total knee arthroplasty with patellar clunk.  The various methods of treatment have been discussed with the patient and family. After consideration of risks, benefits and other options for treatment, the patient has consented to  Procedure(s) with comments: Open excision of scar tissue of left total knee arthroplasty (Left) - 90 mins as a surgical intervention.  The patient's history has been reviewed, patient examined, no change in status, stable for surgery.  I have reviewed the patient's chart and labs.  Questions were answered to the patient's satisfaction.     Mauri Pole

## 2020-11-24 NOTE — Brief Op Note (Signed)
11/24/2020  9:54 AM  PATIENT:  Katelyn [name]  65 y.o. female  PRE-OPERATIVE DIAGNOSIS:  Status post left total knee arthroplasty with patellar clunk  POST-OPERATIVE DIAGNOSIS:  Status post left total knee arthroplasty with patellar clunk with ligament instability  PROCEDURE:  Procedure(s) with comments: Open excision of scar tissue of left total knee arthroplasty with polyethylene revision (Left) - 90 mins  SURGEON:  Surgeon(s) and Role:    Paralee Cancel, MD - Primary  PHYSICIAN ASSISTANT: Danae Orleans, PA-C  ANESTHESIA:   regional and spinal  EBL:  20 mL   BLOOD ADMINISTERED:none  DRAINS: none   LOCAL MEDICATIONS USED:  MARCAINE     SPECIMEN:  No Specimen  DISPOSITION OF SPECIMEN:  N/A  COUNTS:  YES  TOURNIQUET:   Total Tourniquet Time Documented: Thigh (Left) - 17 minutes Total: Thigh (Left) - 17 minutes   DICTATION: .Other Dictation: Dictation Number [id number]  PLAN OF CARE: Discharge to home after PACU  PATIENT DISPOSITION:  PACU - hemodynamically stable.   Delay start of Pharmacological VTE agent (>24hrs) due to surgical blood loss or risk of bleeding: no

## 2020-11-25 ENCOUNTER — Encounter (HOSPITAL_COMMUNITY): Payer: Self-pay | Admitting: Orthopedic Surgery

## 2020-12-30 ENCOUNTER — Other Ambulatory Visit: Payer: Self-pay | Admitting: Internal Medicine

## 2020-12-30 DIAGNOSIS — I10 Essential (primary) hypertension: Secondary | ICD-10-CM

## 2020-12-30 NOTE — Telephone Encounter (Addendum)
Landon from [facility] called asking for clarification on the phentermine denial. Wanted to know what was meant by "Change Not Appropriate". Please call pharmacy back [phone]. Spoke to [place] to clarify. She meant to select "Refill not Appropriate". Called Landon to let her know.

## 2021-02-02 ENCOUNTER — Telehealth: Payer: Self-pay | Admitting: Urology

## 2021-02-02 NOTE — Telephone Encounter (Signed)
Pt called today to schedule an appointment with you for May. The last time she was seen was May 2021 @ [facility]. Do you want her to have any kind of imaging before she comes to see you? Thanks!

## 2021-02-09 NOTE — Telephone Encounter (Signed)
Renal US please

## 2021-02-21 ENCOUNTER — Telehealth: Payer: Self-pay | Admitting: Internal Medicine

## 2021-02-21 NOTE — Telephone Encounter (Signed)
Did you give the ok to see this patient as a TOC from [place]? Her husband is [employer]. Please advise. EM

## 2021-02-22 NOTE — Telephone Encounter (Signed)
Left message for patient that labs will be done at visit; advised to call back if she has any questions or concerns.

## 2021-02-22 NOTE — Telephone Encounter (Signed)
Mrs. Ebling asked if Dr. Damita Dunnings can put in a lab for thyroid, vitiamin b-12 & d and iron and what else he would like to see.  She is set to be seen 5/27 @ 4pm

## 2021-02-22 NOTE — Telephone Encounter (Signed)
I would defer any labs until I can see the patient.  We can do them at the visit if needed.  That would be preferred.  Thanks.

## 2021-02-22 NOTE — Telephone Encounter (Signed)
Yes, needs 57min OV TOC when possible.  Thanks

## 2021-02-22 NOTE — Telephone Encounter (Signed)
Please scheduled TOC appt with Dr. Damita Dunnings when possible.

## 2021-02-22 NOTE — Telephone Encounter (Signed)
Are there any other labs you want drawn on this patient when she comes in for her appt other then what she is requesting?

## 2021-03-17 ENCOUNTER — Other Ambulatory Visit: Payer: Self-pay

## 2021-03-17 ENCOUNTER — Ambulatory Visit (HOSPITAL_COMMUNITY)
Admission: RE | Admit: 2021-03-17 | Discharge: 2021-03-17 | Disposition: A | Discharge status: Home / Self Care | Payer: 59 | Source: Ambulatory Visit | Attending: Urology | Admitting: Urology

## 2021-03-17 DIAGNOSIS — N200 Calculus of kidney: Secondary | ICD-10-CM | POA: Insufficient documentation

## 2021-03-23 NOTE — Progress Notes (Signed)
Sent via mychart

## 2021-03-24 ENCOUNTER — Ambulatory Visit (INDEPENDENT_AMBULATORY_CARE_PROVIDER_SITE_OTHER): Payer: 59 | Admitting: Urology

## 2021-03-24 ENCOUNTER — Other Ambulatory Visit: Payer: Self-pay

## 2021-03-24 ENCOUNTER — Encounter: Payer: Self-pay | Admitting: Urology

## 2021-03-24 VITALS — HR 76 | Temp 99.1°F | Ht 66.0 in

## 2021-03-24 DIAGNOSIS — N200 Calculus of kidney: Secondary | ICD-10-CM | POA: Diagnosis not present

## 2021-03-24 DIAGNOSIS — M549 Dorsalgia, unspecified: Secondary | ICD-10-CM

## 2021-03-24 LAB — MICROSCOPIC EXAMINATION: Renal Epithel, UA: NONE SEEN /hpf

## 2021-03-24 LAB — URINALYSIS, ROUTINE W REFLEX MICROSCOPIC
Bilirubin, UA: NEGATIVE
Glucose, UA: NEGATIVE
Ketones, UA: NEGATIVE
Leukocytes,UA: NEGATIVE
Nitrite, UA: NEGATIVE
Specific Gravity, UA: >1.03 — ABNORMAL HIGH (ref 1.005–1.030)
Urobilinogen, Ur: 0.2 mg/dL (ref 0.2–1.0)
pH, UA: 6 (ref 5.0–7.5)

## 2021-03-24 MED ORDER — MIRABEGRON ER 25 MG PO TB24: 25.0000 mg | ORAL_TABLET | Freq: Every day | ORAL | 11 refills | Status: DC

## 2021-03-24 MED ORDER — CYCLOBENZAPRINE HCL 5 MG PO TABS: 5.0000 mg | ORAL_TABLET | Freq: Three times a day (TID) | ORAL | 3 refills | Status: DC | PRN

## 2021-03-24 MED ORDER — TAMSULOSIN HCL 0.4 MG PO CAPS: 0.4000 mg | ORAL_CAPSULE | Freq: Every day | ORAL | 11 refills | Status: DC | PRN

## 2021-03-24 NOTE — Progress Notes (Signed)
Urological Symptom Review  Patient is experiencing the following symptoms: Hard to postpone urination Get up at night to urinate  Kidney stones   Review of Systems  Gastrointestinal (upper)  : Negative for upper GI symptoms  Gastrointestinal (lower) : Diarrhea  Constitutional : Negative for symptoms  Skin: Negative for skin symptoms  Eyes: Negative for eye symptoms  Ear/Nose/Throat : Sinus problems  Hematologic/Lymphatic: Negative for Hematologic/Lymphatic symptoms  Cardiovascular : Negative for cardiovascular symptoms  Respiratory : Negative for respiratory symptoms  Endocrine: Negative for endocrine symptoms  Musculoskeletal: Back pain Joint pain  Neurological: Headaches  Psychologic: Negative for psychiatric symptoms

## 2021-03-24 NOTE — Progress Notes (Signed)
03/24/2021 3:04 PM   Katelyn [name] 08-Aug-1956 [id number]  Referring provider: Jearld Fenton, NP [address] [place],  [place] [address]  followup nephrolithiasis  HPI: [name] is a 65yo here for followup for nephrolithiasis and back pain. Renal US from 03/17/2021 shows no calculi and no hydronephrosis. She is having intermittent right flank pain. The pain is sharp intermittent nonraditing. Pain lasts 5-10 minutes. She was previously treated with flexeril which alleviated the pain.   Her records from [facility] are as follows: I have kidney stones.  HPI: Katelyn [name] is a 65 year-old female established patient who is here for renal calculi.  The problem is on the left side. She first stated noticing pain on 04/07/2020. This is not her first kidney stone. Her first stone was approximately 04/13/2003. She has had more than 5 stones prior to getting this one. She is currently having back pain and nausea. She denies having flank pain, groin pain, vomiting, fever, and chills. She has not caught a stone in her urine strainer since her symptoms began.   She has had ureteral stent and ureteroscopy for treatment of her stones in the past.   04/23/2017: 1 day ago she developed severe, sharp, intermittent, right flank pain with associated nausea and vomiting. She had URS 2 times previously with Dr. Jeffie Pollock   05/03/2017: s/p R URS. Stent removed without incident 4 days ago. She has mild right flank pain. She has new left flank pain that is dull and mild   09/05/2017: s/p R URS. she has intermittent dull bilateral flank pain that is nonradiating.   11/01/2017: No stone events since last visit. Renal US showed bilateral calcifications which are likely vascular calcification   05/09/2018: No stone events since last visit. renal US shows bilateral calcifications which are likely vascular calcifications   10/31/2018: no stone events since last visit. renal US shows no calculi   06/25/2019: No stone events since  last visit. Renal US shows no calculi   03/04/2020: NO stone events since last visit. Renal US shows probably 30mm right lower pole calculus   04/07/2020: Starting this morning she developed right flank pain.     PMH: Past Medical History:  Diagnosis Date  . Anemia   . Arthritis   . Asthma   . Headache(784.0)    cluster headaches, migraines- states is taking Atenolol at present as directed per Dr Lacinda Axon  to prevent migraine from increased stress-  states if on medication for several weeks that heart rate will drop to 40's  . History of kidney stones   . HYPOTHYROIDISM 05/27/2007  . Pneumonia    post op x 2 last yrs ago  . PONV (postoperative nausea and vomiting)    likes phenergan with anesthesia, has woken up with 2 surgeries per patient   . WEIGHT GAIN 07/15/2009    Surgical History: Past Surgical History:  Procedure Laterality Date  . ABDOMINAL HYSTERECTOMY    . APPENDECTOMY    . CYSTOSCOPY WITH RETROGRADE PYELOGRAM, URETEROSCOPY AND STENT PLACEMENT Right 04/26/2017   Procedure: CYSTOSCOPY WITH RETROGRADE PYELOGRAM, URETEROSCOPY AND STENT PLACEMENT;  Surgeon: Cleon Gustin, MD;  Location: [facility];  Service: Urology;  Laterality: Right;  . CYSTOSCOPY WITH RETROGRADE PYELOGRAM, URETEROSCOPY AND STENT PLACEMENT Right 08/19/2017   Procedure: CYSTOSCOPY WITH RETROGRADE PYELOGRAM, URETEROSCOPY;  Surgeon: Cleon Gustin, MD;  Location[facility];  Service: Urology;  Laterality: Right;  . DIAGNOSTIC LAPAROSCOPY    . FOOT SURGERY  2007  tumor removed from right foot/   . HOLMIUM LASER APPLICATION Right 6/60/6301   Procedure: HOLMIUM LASER APPLICATION;  Surgeon: Cleon Gustin, MD;  Location: [facility];  Service: Urology;  Laterality: Right;  . KNEE ARTHROSCOPY Left    x 3  . LAPAROTOMY  10/30/2011   Procedure: EXPLORATORY LAPAROTOMY;  Surgeon: Janie Morning, MD;  Location: [facility];  Service: Gynecology;  Laterality: N/A;  . mortons neuroma removed from  foot    . SALPINGOOPHORECTOMY  10/30/2011   Procedure: SALPINGO OOPHERECTOMY;  Surgeon: Janie Morning, MD;  Location: [facility];  Service: Gynecology;  Laterality: Right;  RIGHT  . SCAR DEBRIDEMENT OF TOTAL KNEE Left 11/24/2020   Procedure: Open excision of scar tissue of left total knee arthroplasty with polyethylene revision;  Surgeon: Paralee Cancel, MD;  Location: [facility];  Service: Orthopedics;  Laterality: Left;  90 mins  . TONSILLECTOMY    . TOTAL KNEE ARTHROPLASTY Left 09/25/2017   Procedure: LEFT TOTAL KNEE ARTHROPLASTY;  Surgeon: Latanya Maudlin, MD;  Location: [facility];  Service: Orthopedics;  Laterality: Left;  . TUBAL LIGATION    . TYMPANOSTOMY TUBE PLACEMENT      Home Medications:  Allergies as of 03/24/2021      Reactions   Adhesive [tape] Swelling, Other (See Comments)   Reaction:  Eats in skin   Ciprofloxacin Hives, Shortness Of Breath   Dimethicone Hives, Shortness Of Breath   Erythromycin Hives, Shortness Of Breath   Latex Hives, Shortness Of Breath   Other Hives, Shortness Of Breath, Other (See Comments)   Pt is allergic to nitrile gloves.  Pt is allergic to antibacterial soaps- rash - states she only uses Dove soap  SCDs- patient states - had itching with this    Penicillins Hives, Shortness Of Breath, Other (See Comments)   Has patient had a PCN reaction causing immediate rash, facial/tongue/throat swelling, SOB or lightheadedness with hypotension: Yes Has patient had a PCN reaction causing severe rash involving mucus membranes or skin necrosis: No Has patient had a PCN reaction that required hospitalization No Has patient had a PCN reaction occurring within the last 10 years: No If all of the above answers are "NO", then may proceed with Cephalosporin use.   Pineapple Anaphylaxis   Red Dye Hives, Shortness Of Breath   Shellfish Allergy Hives, Shortness Of Breath   Sulfonamide Derivatives Hives, Shortness Of Breath   Alcohol-sulfur [elemental Sulfur] Swelling, Other  (See Comments)   Reaction:  Localized swelling  With alcohol   Aspirin Nausea And Vomiting   Doxycycline Nausea And Vomiting   Hibiclens [chlorhexidine]    Rash    Ceftin [cefuroxime] Rash      Medication List       Accurate as of Mar 24, 2021  3:04 PM. If you have any questions, ask your nurse or doctor.        albuterol 108 (90 Base) MCG/ACT inhaler Commonly known as: VENTOLIN HFA Inhale 2 puffs into the lungs every 6 (six) hours as needed for wheezing or shortness of breath.   atenolol 25 MG tablet Commonly known as: TENORMIN TAKE 1 TABLET BY MOUTH EVERY MORNING FORMIGRAINES   Biotin 1 MG Caps Take 1 mg by mouth daily.   cholecalciferol 1000 units tablet Commonly known as: VITAMIN D Take 1,000 Units by mouth daily.   docusate sodium 100 MG capsule Commonly known as: Colace Take 1 capsule (100 mg total) by mouth 2 (two) times daily.   ferrous sulfate 325 (65 FE) MG  tablet Commonly known as: FerrouSul Take 1 tablet (325 mg total) by mouth 3 (three) times daily with meals for 14 days.   HYDROcodone-acetaminophen 7.5-325 MG tablet Commonly known as: Norco Take 1-2 tablets by mouth every 4 (four) hours as needed for moderate pain.   methocarbamol 500 MG tablet Commonly known as: Robaxin Take 1 tablet (500 mg total) by mouth every 6 (six) hours as needed for muscle spasms.   mirabegron ER 25 MG Tb24 tablet Commonly known as: MYRBETRIQ Take 25 mg by mouth daily.   phentermine 37.5 MG capsule Take 1 capsule (37.5 mg total) by mouth every morning.   polyethylene glycol 17 g packet Commonly known as: MIRALAX / GLYCOLAX Take 17 g by mouth 2 (two) times daily.   promethazine 25 MG tablet Commonly known as: PHENERGAN Take 0.5-1 tablets (12.5-25 mg total) by mouth every 6 (six) hours as needed for nausea or vomiting.   rivaroxaban 10 MG Tabs tablet Commonly known as: Xarelto Take 1 tablet (10 mg total) by mouth daily.   Synthroid 137 MCG tablet Generic drug:  levothyroxine TAKE 1 TABLET BY MOUTH ONCE A DAY BEFOREBREAKFAST (SCHEDULE LAB APPT PER DR)   tamsulosin 0.4 MG Caps capsule Commonly known as: FLOMAX Take 0.4 mg by mouth daily as needed (kidney stones).   Vitamin B-12 2500 MCG Subl Place 2,500 mcg under the tongue daily.       Allergies:  Allergies  Allergen Reactions  . Adhesive [Tape] Swelling and Other (See Comments)    Reaction:  Eats in skin  . Ciprofloxacin Hives and Shortness Of Breath  . Dimethicone Hives and Shortness Of Breath  . Erythromycin Hives and Shortness Of Breath  . Latex Hives and Shortness Of Breath  . Other Hives, Shortness Of Breath and Other (See Comments)    Pt is allergic to nitrile gloves.  Pt is allergic to antibacterial soaps- rash - states she only uses Dove soap  SCDs- patient states - had itching with this   . Penicillins Hives, Shortness Of Breath and Other (See Comments)    Has patient had a PCN reaction causing immediate rash, facial/tongue/throat swelling, SOB or lightheadedness with hypotension: Yes Has patient had a PCN reaction causing severe rash involving mucus membranes or skin necrosis: No Has patient had a PCN reaction that required hospitalization No Has patient had a PCN reaction occurring within the last 10 years: No If all of the above answers are "NO", then may proceed with Cephalosporin use.  Marland Kitchen Pineapple Anaphylaxis  . Red Dye Hives and Shortness Of Breath  . Shellfish Allergy Hives and Shortness Of Breath  . Sulfonamide Derivatives Hives and Shortness Of Breath  . Alcohol-Sulfur [Elemental Sulfur] Swelling and Other (See Comments)    Reaction:  Localized swelling  With alcohol   . Aspirin Nausea And Vomiting  . Doxycycline Nausea And Vomiting  . Hibiclens [Chlorhexidine]     Rash   . Ceftin [Cefuroxime] Rash    Family History: Family History  Problem Relation Age of Onset  . Alzheimer's disease Mother   . Cervical cancer Mother   . Diabetes Sister   . Heart attack  Brother   . Hypertension Neg Hx        family hx  . Heart disease Neg Hx        family hx    Social History:  reports that she has never smoked. She has never used smokeless tobacco. She reports that she does not drink alcohol and does not  use drugs.  ROS: All other review of systems were reviewed and are negative except what is noted above in HPI  Physical Exam: Pulse 76   Temp 99.1 F (37.3 C) (Oral)   Ht 5\' 6"  (1.676 m)   BMI 26.95 kg/m   Constitutional:  Alert and oriented, No acute distress. HEENT:  AT, moist mucus membranes.  Trachea midline, no masses. Cardiovascular: No clubbing, cyanosis, or edema. Respiratory: Normal respiratory effort, no increased work of breathing. GI: Abdomen is soft, nontender, nondistended, no abdominal masses GU: No CVA tenderness.  Lymph: No cervical or inguinal lymphadenopathy. Skin: No rashes, bruises or suspicious lesions. Neurologic: Grossly intact, no focal deficits, moving all 4 extremities. Psychiatric: Normal mood and affect.  Laboratory Data: Lab Results  Component Value Date   WBC 7.8 11/15/2020   HGB 14.8 11/15/2020   HCT 45.2 11/15/2020   MCV 86.6 11/15/2020   PLT 314 11/15/2020    Lab Results  Component Value Date   CREATININE 0.66 11/15/2020    No results found for: PSA  No results found for: TESTOSTERONE  Lab Results  Component Value Date   HGBA1C 5.6 11/02/2020    Urinalysis    Component Value Date/Time   COLORURINE YELLOW 09/28/2017 0126   APPEARANCEUR CLEAR 09/28/2017 0126   LABSPEC 1.010 09/28/2017 0126   PHURINE 6.0 09/28/2017 0126   GLUCOSEU NEGATIVE 09/28/2017 0126   HGBUR MODERATE (A) 09/28/2017 0126   BILIRUBINUR negative 11/02/2020 1434   KETONESUR 20 (A) 09/28/2017 0126   PROTEINUR Positive (A) 11/02/2020 1434   PROTEINUR NEGATIVE 09/28/2017 0126   UROBILINOGEN 0.2 11/02/2020 1434   UROBILINOGEN 0.2 08/30/2011 2014   NITRITE negative 11/02/2020 1434   NITRITE NEGATIVE 09/28/2017 0126    LEUKOCYTESUR Negative 11/02/2020 1434    Lab Results  Component Value Date   BACTERIA NONE SEEN 09/28/2017    Pertinent Imaging: Renal US 03/17/2021: Images reviewed and discussed with the patient Results for orders placed during the hospital encounter of 07/22/17  DG Abdomen 1 View  Narrative CLINICAL DATA:  Left flank pain and patient with a known history of urinary tract stones.  EXAM: ABDOMEN - 1 VIEW  COMPARISON:  CT abdomen and pelvis 04/23/2017.  FINDINGS: A subtle 0.5 cm density projecting in the mid pole of the right kidney may be a nonobstructing stone. No other evidence of urinary tract stone is identified. Punctate pelvic phlebolith is noted. The bowel gas pattern is unremarkable. No focal bony abnormality.  IMPRESSION: Possible 0.5 cm nonobstructing stone midpole right kidney. No other evidence of urinary tract stone.   Electronically Signed By: Inge Rise M.D. On: 07/22/2017 02:07  No results found for this or any previous visit.  No results found for this or any previous visit.  No results found for this or any previous visit.  Results for orders placed during the hospital encounter of 03/17/21  US RENAL  Narrative CLINICAL DATA:  Follow-up renal stones.  EXAM: RENAL / URINARY TRACT ULTRASOUND COMPLETE  COMPARISON:  03/04/2020  FINDINGS: Right Kidney:  Renal measurements: 10.3 x 5.4 x 6.2 cm = volume: 181 mL. Echogenicity within normal limits. No mass or hydronephrosis visualized. 1.8 cm cyst over the lower pole and 1.9 cm cyst over the mid pole.  Left Kidney:  Renal measurements: 12.3 x 6.3 x 5.8 cm = volume: 236 mL. Echogenicity within normal limits. No mass or hydronephrosis visualized. Findings suggesting multiple parapelvic renal cysts.  Bladder:  Appears normal for degree of bladder distention. Bilateral  ureteral jets demonstrated.  Other:  None.  IMPRESSION: Normal size kidneys without hydronephrosis.   Bilateral renal cysts.   Electronically Signed By: Marin Olp M.D. On: 03/20/2021 15:39  No results found for this or any previous visit.  No results found for this or any previous visit.  Results for orders placed during the hospital encounter of 04/23/17  CT Renal Stone Study  Narrative CLINICAL DATA:  Acute onset of right flank pain. Leukocytosis. Red blood cells and white blood cells in the urine. Initial encounter.  EXAM: CT ABDOMEN AND PELVIS WITHOUT CONTRAST  TECHNIQUE: Multidetector CT imaging of the abdomen and pelvis was performed following the standard protocol without IV contrast.  COMPARISON:  CT of the abdomen and pelvis performed 01/08/2017, and pelvic ultrasound performed 08/31/2011  FINDINGS: Lower chest: Mild bibasilar atelectasis is noted. Calcification is seen at the mitral valve.  Hepatobiliary: There is diffuse fatty infiltration within the liver. The gallbladder is unremarkable in appearance. The common bile duct remains normal in size.  Pancreas: The pancreas is within normal limits.  Spleen: The spleen is unremarkable in appearance.  Adrenals/Urinary Tract: The adrenal glands are unremarkable in appearance.  Moderate right-sided hydronephrosis is noted, with an obstructing 7 x 6 mm stone at the proximal right ureter, just below the right renal pelvis. Right-sided perinephric stranding and fluid are noted.  Scattered nonobstructing bilateral renal stones are seen, larger on the right. Prominent left renal parapelvic cysts are noted.  Stomach/Bowel: The stomach is unremarkable in appearance. The small bowel is within normal limits. The appendix is normal in caliber, without evidence of appendicitis. The colon is unremarkable in appearance.  Vascular/Lymphatic: Scattered calcification is seen along the abdominal aorta and its branches. A retroaortic left renal vein is noted. The abdominal aorta is otherwise grossly unremarkable.  The inferior vena cava is grossly unremarkable. No retroperitoneal lymphadenopathy is seen. No pelvic sidewall lymphadenopathy is identified.  Reproductive: The stomach is unremarkable in appearance. The small bowel is within normal limits. The patient is status post appendectomy. The colon is unremarkable in appearance.  Other: No additional soft tissue abnormalities are seen.  Musculoskeletal: No acute osseous abnormalities are identified. Mild vacuum phenomenon is noted at L5-S1. The visualized musculature is unremarkable in appearance.  IMPRESSION: 1. Moderate right-sided hydronephrosis, with an obstructing large 7 x 6 mm stone at the proximal right ureter, just below the right renal pelvis. 2. Nonobstructing bilateral renal stones, larger on the right. 3. Prominent left renal parapelvic cysts noted. 4. Scattered aortic atherosclerosis. 5. Mild bibasilar atelectasis noted. Calcification at the mitral valve. 6. Diffuse fatty infiltration within the liver.   Electronically Signed By: Garald Balding M.D. On: 04/23/2017 05:12   Assessment & Plan:    1. Nephrolithiasis -RTC 1 year with renal US - Urinalysis, Routine w reflex microscopic  2. Dorsalgia -flexeril prn   No follow-ups on file.  Nicolette Bang, MD  [facility] [place]

## 2021-03-24 NOTE — Patient Instructions (Signed)

## 2021-03-28 ENCOUNTER — Other Ambulatory Visit: Payer: Self-pay | Admitting: Family Medicine

## 2021-03-28 ENCOUNTER — Other Ambulatory Visit: Payer: Self-pay | Admitting: Internal Medicine

## 2021-03-28 DIAGNOSIS — I10 Essential (primary) hypertension: Secondary | ICD-10-CM

## 2021-04-05 ENCOUNTER — Other Ambulatory Visit: Payer: Self-pay | Admitting: Internal Medicine

## 2021-04-05 ENCOUNTER — Other Ambulatory Visit: Payer: Self-pay | Admitting: Family Medicine

## 2021-04-07 ENCOUNTER — Ambulatory Visit (INDEPENDENT_AMBULATORY_CARE_PROVIDER_SITE_OTHER): Payer: 59 | Admitting: Family Medicine

## 2021-04-07 ENCOUNTER — Other Ambulatory Visit: Payer: Self-pay

## 2021-04-07 ENCOUNTER — Encounter: Payer: Self-pay | Admitting: Family Medicine

## 2021-04-07 VITALS — BP 154/80 | HR 67 | Temp 98.2°F | Ht 66.0 in | Wt 181.0 lb

## 2021-04-07 DIAGNOSIS — E039 Hypothyroidism, unspecified: Secondary | ICD-10-CM | POA: Diagnosis not present

## 2021-04-07 DIAGNOSIS — E538 Deficiency of other specified B group vitamins: Secondary | ICD-10-CM

## 2021-04-07 DIAGNOSIS — E559 Vitamin D deficiency, unspecified: Secondary | ICD-10-CM

## 2021-04-07 DIAGNOSIS — Z7189 Other specified counseling: Secondary | ICD-10-CM

## 2021-04-07 DIAGNOSIS — Z0001 Encounter for general adult medical examination with abnormal findings: Secondary | ICD-10-CM

## 2021-04-07 DIAGNOSIS — R590 Localized enlarged lymph nodes: Secondary | ICD-10-CM

## 2021-04-07 DIAGNOSIS — Z1211 Encounter for screening for malignant neoplasm of colon: Secondary | ICD-10-CM

## 2021-04-07 DIAGNOSIS — Z889 Allergy status to unspecified drugs, medicaments and biological substances status: Secondary | ICD-10-CM

## 2021-04-07 DIAGNOSIS — E7800 Pure hypercholesterolemia, unspecified: Secondary | ICD-10-CM

## 2021-04-07 MED ORDER — ALBUTEROL SULFATE HFA 108 (90 BASE) MCG/ACT IN AERS: 2.0000 | INHALATION_SPRAY | Freq: Four times a day (QID) | RESPIRATORY_TRACT | 2 refills | Status: DC | PRN

## 2021-04-07 MED ORDER — MIRABEGRON ER 25 MG PO TB24: ORAL_TABLET | ORAL | Status: DC

## 2021-04-07 NOTE — Patient Instructions (Addendum)
I advise you to talk to the allergy clinic about your allergy list.  Please let me know if you are willing to go.   Go to the lab on the way out.   If you have mychart we'll likely use that to update you.    Ask [place] about your allergies and any possible vaccines.   Take care.  Glad to see you.

## 2021-04-07 NOTE — Progress Notes (Signed)
This visit occurred during the SARS-CoV-2 public health emergency.  Safety protocols were in place, including screening questions prior to the visit, additional usage of staff PPE, and extensive cleaning of exam room while observing appropriate contact time as indicated for disinfecting solutions.  CPE- See plan.  Routine anticipatory guidance given to patient.  See health maintenance.  The possibility exists that previously documented standard health maintenance information may have been brought forward from a previous encounter into this note.  If needed, that same information has been updated to reflect the current situation based on today's encounter.    She had recent L knee surgery.  D/w pt.  She is dealing with L groin pain at baseline.  She had seen [facility] orth[facility].    D/w pt allergy clinic referral re: med allergies.  See AVS. given the number of allergies she reports, I highly advised her to talk to the allergy clinic.  We explicitly discussed that she could have an infection in the future with exceedingly limited treatment options otherwise.  She does not have to follow through with allergy clinic referral but it would be [place] not to see the allergy clinic.  Hypothyroidism.  Longstanding.  Concurrently on phentermine, d/w pt about use.  It sounds like she has had Hashimoto's thyroiditis with fluctuating TSH levels over the years.  It sounds like she has used phentermine as a weight control measure when she would have symptoms of hypothyroidism.  See notes on labs.  I didn't guarantee about writing for phentermine.  She has used QD vs QOD depending on her weight, occ off a week at a time.    Advance directive d/w pt.  Husband designated if patient were incapacitated.   Mammogram pending 2022.  DXA can be done later on.  We talked about GI referral.  She wanted appointment prior to considering colonoscopy.   Vaccines d/w pt.   encouraged routine vaccination.  She can talk to her  pharmacy about this if she is concerned about any potential allergies.  PMH and SH reviewed  Meds, vitals, and allergies reviewed.   ROS: Per HPI.  Unless specifically indicated otherwise in HPI, the patient denies:  General: fever. Eyes: acute vision changes ENT: sore throat Cardiovascular: chest pain Respiratory: SOB GI: vomiting GU: dysuria Musculoskeletal: acute back pain Derm: acute rash Neuro: acute motor dysfunction Psych: worsening mood Endocrine: polydipsia Heme: bleeding Allergy: hayfever  GEN: nad, alert and oriented HEENT: ncat NECK: supple w/o LA CV: rrr. PULM: ctab, no inc wob ABD: soft, +bs EXT: no edema SKIN: no acute rash Contracture R palm- old finding.   She is a single isolated lymph node on the right jawline that has been present for an extended period of time per patient report.  She says it is not enlarging.  Is not tender to palpation.  No erythema.  No other lymphadenopathy noted. She is using a crutch currently due to knee surgery.

## 2021-04-08 LAB — COMPREHENSIVE METABOLIC PANEL
ALT: 16 IU/L (ref 0–32)
AST: 18 IU/L (ref 0–40)
Albumin/Globulin Ratio: 1.8 (ref 1.2–2.2)
Albumin: 4.4 g/dL (ref 3.8–4.8)
Alkaline Phosphatase: 97 IU/L (ref 44–121)
BUN/Creatinine Ratio: 11 — ABNORMAL LOW (ref 12–28)
BUN: 8 mg/dL (ref 8–27)
Bilirubin Total: 0.6 mg/dL (ref 0.0–1.2)
CO2: 20 mmol/L (ref 20–29)
Calcium: 8.9 mg/dL (ref 8.7–10.3)
Chloride: 101 mmol/L (ref 96–106)
Creatinine, Ser: 0.75 mg/dL (ref 0.57–1.00)
Globulin, Total: 2.4 g/dL (ref 1.5–4.5)
Glucose: 82 mg/dL (ref 65–99)
Potassium: 3.3 mmol/L — ABNORMAL LOW (ref 3.5–5.2)
Sodium: 143 mmol/L (ref 134–144)
Total Protein: 6.8 g/dL (ref 6.0–8.5)
eGFR: 89 mL/min/{1.73_m2} (ref >59)

## 2021-04-08 LAB — CBC WITH DIFFERENTIAL/PLATELET
Basophils Absolute: 0.1 10*3/uL (ref 0.0–0.2)
Basos: 1 %
EOS (ABSOLUTE): 0.2 10*3/uL (ref 0.0–0.4)
Eos: 2 %
Hematocrit: 40.9 % (ref 34.0–46.6)
Hemoglobin: 13.7 g/dL (ref 11.1–15.9)
Immature Grans (Abs): 0 10*3/uL (ref 0.0–0.1)
Immature Granulocytes: 0 %
Lymphocytes Absolute: 2.8 10*3/uL (ref 0.7–3.1)
Lymphs: 35 %
MCH: 28.6 pg (ref 26.6–33.0)
MCHC: 33.5 g/dL (ref 31.5–35.7)
MCV: 85 fL (ref 79–97)
Monocytes Absolute: 0.5 10*3/uL (ref 0.1–0.9)
Monocytes: 7 %
Neutrophils Absolute: 4.3 10*3/uL (ref 1.4–7.0)
Neutrophils: 55 %
Platelets: 305 10*3/uL (ref 150–450)
RBC: 4.79 x10E6/uL (ref 3.77–5.28)
RDW: 13.4 % (ref 11.7–15.4)
WBC: 7.9 10*3/uL (ref 3.4–10.8)

## 2021-04-08 LAB — TSH: TSH: 40.3 u[IU]/mL — ABNORMAL HIGH (ref 0.450–4.500)

## 2021-04-08 LAB — VITAMIN B12: Vitamin B-12: 388 pg/mL (ref 232–1245)

## 2021-04-08 LAB — LIPID PANEL
Chol/HDL Ratio: 5.4 ratio — ABNORMAL HIGH (ref 0.0–4.4)
Cholesterol, Total: 231 mg/dL — ABNORMAL HIGH (ref 100–199)
HDL: 43 mg/dL (ref >39)
LDL Chol Calc (NIH): 162 mg/dL — ABNORMAL HIGH (ref 0–99)
Triglycerides: 143 mg/dL (ref 0–149)
VLDL Cholesterol Cal: 26 mg/dL (ref 5–40)

## 2021-04-08 LAB — VITAMIN D 25 HYDROXY (VIT D DEFICIENCY, FRACTURES): Vit D, 25-Hydroxy: 21.9 ng/mL — ABNORMAL LOW (ref 30.0–100.0)

## 2021-04-08 LAB — THYROID PEROXIDASE ANTIBODY: Thyroperoxidase Ab SerPl-aCnc: 78 IU/mL — ABNORMAL HIGH (ref 0–34)

## 2021-04-11 ENCOUNTER — Encounter: Payer: Self-pay | Admitting: Family Medicine

## 2021-04-12 ENCOUNTER — Other Ambulatory Visit: Payer: Self-pay | Admitting: Family Medicine

## 2021-04-12 ENCOUNTER — Encounter: Payer: Self-pay | Admitting: Family Medicine

## 2021-04-12 DIAGNOSIS — Z0001 Encounter for general adult medical examination with abnormal findings: Secondary | ICD-10-CM | POA: Insufficient documentation

## 2021-04-12 DIAGNOSIS — R590 Localized enlarged lymph nodes: Secondary | ICD-10-CM | POA: Insufficient documentation

## 2021-04-12 DIAGNOSIS — Z0000 Encounter for general adult medical examination without abnormal findings: Secondary | ICD-10-CM | POA: Insufficient documentation

## 2021-04-12 DIAGNOSIS — Z8616 Personal history of COVID-19: Secondary | ICD-10-CM

## 2021-04-12 DIAGNOSIS — Z889 Allergy status to unspecified drugs, medicaments and biological substances status: Secondary | ICD-10-CM | POA: Insufficient documentation

## 2021-04-12 DIAGNOSIS — E039 Hypothyroidism, unspecified: Secondary | ICD-10-CM

## 2021-04-12 DIAGNOSIS — Z7189 Other specified counseling: Secondary | ICD-10-CM | POA: Insufficient documentation

## 2021-04-12 DIAGNOSIS — E559 Vitamin D deficiency, unspecified: Secondary | ICD-10-CM

## 2021-04-12 MED ORDER — LEVOTHYROXINE SODIUM 150 MCG PO TABS: 150.0000 ug | ORAL_TABLET | Freq: Every day | ORAL | 1 refills | Status: DC

## 2021-04-12 MED ORDER — VITAMIN D3 50 MCG (2000 UT) PO CAPS: 2000.0000 [IU] | ORAL_CAPSULE | Freq: Every day | ORAL | Status: DC

## 2021-04-12 NOTE — Assessment & Plan Note (Signed)
I think it makes sense to check her labs today.  We talked about phentermine use.  I did not make any guarantee about writing phentermine in the future.  We did talk about endocrine referral.  See notes on labs.

## 2021-04-12 NOTE — Assessment & Plan Note (Signed)
Advance directive d/w pt.  Husband designated if patient were incapacitated.   Mammogram pending 2022.  DXA can be done later on.  We talked about GI referral.  She wanted appointment prior to considering colonoscopy.   Vaccines d/w pt.   encouraged routine vaccination.  She can talk to her pharmacy about this if she is concerned about any potential allergies.

## 2021-04-12 NOTE — Assessment & Plan Note (Signed)
Advance directive- d/w pt. Husband designated if patient were incapacitated.  

## 2021-04-12 NOTE — Assessment & Plan Note (Signed)
D/w pt allergy clinic referral re: med allergies.  See AVS. given the number of allergies she reports, I highly advised her to talk to the allergy clinic.  We explicitly discussed that she could have an infection in the future with exceedingly limited treatment options otherwise.  She does not have to follow through with allergy clinic referral but it would be [place] not to see the allergy clinic.

## 2021-04-12 NOTE — Assessment & Plan Note (Signed)
Single node, longstanding per patient.  Not enlarged.  Not tender.  It is small, approximately 1 cm across.  Would observe for now.  She does not have night sweats or other alarming symptoms.

## 2021-04-17 ENCOUNTER — Telehealth: Payer: Self-pay

## 2021-04-17 DIAGNOSIS — U071 COVID-19: Secondary | ICD-10-CM

## 2021-04-17 NOTE — Telephone Encounter (Signed)
There are no openings in the clinic; advised patient to go to UC for eval for tx but patient is going to see if another clinic has openings. She will call back and cancel covid test appt if she is seen elsewhere.

## 2021-04-17 NOTE — Telephone Encounter (Signed)
Symptoms started on 04/14/21-severe headache, left work early, was exposed to someone with Covid at work also, raw/red throat, congested, fatigue, chills some. No fever. Took home test and it was positive today. Patient states herwork will not take the home test and needs one from providers office. Do not have anything until Thursday unless schedule virtual visit with Dr Darnell Level for his on hold spots for the Paxlovid patients but usually it is for his patients.   I advised patient we would need to do a virtual first, patient asked if I can ask Dr Damita Dunnings if he can just order the test, she does not feel good and does not want to do a visit just to do the test that will be positive just for her work to accept this.  Please let patient know.

## 2021-04-17 NOTE — Telephone Encounter (Signed)
We can order the test (ordered, needs drive up lab visit- not in clinic), but please offer eval or advise UC for consideration of treatment.  Thanks.

## 2021-04-18 ENCOUNTER — Telehealth (INDEPENDENT_AMBULATORY_CARE_PROVIDER_SITE_OTHER): Payer: 59 | Admitting: Family Medicine

## 2021-04-18 ENCOUNTER — Encounter: Payer: Self-pay | Admitting: Family Medicine

## 2021-04-18 ENCOUNTER — Other Ambulatory Visit: Payer: Self-pay

## 2021-04-18 ENCOUNTER — Other Ambulatory Visit: Payer: 59

## 2021-04-18 VITALS — Temp 97.2°F

## 2021-04-18 DIAGNOSIS — U071 COVID-19: Secondary | ICD-10-CM

## 2021-04-18 MED ORDER — MOLNUPIRAVIR EUA 200MG CAPSULE: 4.0000 | ORAL_CAPSULE | Freq: Two times a day (BID) | ORAL | 0 refills | Status: AC

## 2021-04-18 NOTE — Patient Instructions (Addendum)
---------------------------------------------------------------------------------------------------------------------------    WORK SLIP:  Patient Katelyn [name],  1955/11/17, was seen for a medical visit today, 04/18/21 . Please excuse from work for a COVID like illness. We advise 10 days minimum from the onset of symptoms (04/16/2021) PLUS 1 day of no fever and improved symptoms. Will defer to employer for a sooner return to work if symptoms have resolved, it is greater than 5 days since the positive test and the patient can wear a high-quality, tight fitting mask such as N95 or KN95 at all times for an additional 5 days. Would also suggest COVID19 antigen testing is negative prior to return.  Sincerely: E-signature: Dr. Colin Benton, DO [facility] [place] Ph: [phone]   ------------------------------------------------------------------------------------------------------------------------------   HOME CARE TIPS:  -I sent the medication(s) we discussed to your pharmacy: Meds ordered this encounter  Medications  . molnupiravir EUA 200 mg CAPS    Sig: Take 4 capsules (800 mg total) by mouth 2 (two) times daily for 5 days.    Dispense:  40 capsule    Refill:  0     -I sent in the [facility] treatment or referral you requested per our discussion. Please see the information provided below and discuss further with the pharmacist/treatment team.   -can use nasal saline a few times per day if you have nasal congestion  -stay hydrated, drink plenty of fluids and eat small healthy meals - avoid dairy  -follow up with your doctor in 2-3 days unless improving and feeling better  -stay home while sick, except to seek medical care. If you have COVID19, ideally it would be best to stay home for a full 10 days since the onset of symptoms PLUS one day of no fever and feeling better. Wear a good mask that fits snugly (such as N95 or KN95) if around others to reduce the risk of  transmission.  It was nice to meet you today, and I really hope you are feeling better soon. I help [place] out with telemedicine visits on Tuesdays and Thursdays and am available for visits on those days. If you have any concerns or questions following this visit please schedule a follow up visit with your Primary Care doctor or seek care at a local urgent care clinic to avoid delays in care.    Seek in person care or schedule a follow up video visit promptly if your symptoms worsen, new concerns arise or you are not improving with treatment. Call 911 and/or seek emergency care if your symptoms are severe or life threatening.     Fact Sheet for Patients And Caregivers Emergency Use Authorization (EUA) Of LAGEVRIOT (molnupiravir) capsules For Coronavirus Disease 2019 (COVID-19)  What is the most important information I should know about LAGEVRIO? LAGEVRIO may cause serious side effects, including: . LAGEVRIO may cause harm to your unborn baby. It is not known if LAGEVRIO will harm your baby if you take LAGEVRIO during pregnancy. o LAGEVRIO is not recommended for use in pregnancy. o LAGEVRIO has not been studied in pregnancy. LAGEVRIO was studied in pregnant animals only. When LAGEVRIO was given to pregnant animals, LAGEVRIO caused harm to their unborn babies. o You and your healthcare provider may decide that you should take LAGEVRIO during pregnancy if there are no other COVID-19 treatment options approved or authorized by the FDA that are accessible or clinically appropriate for you. o If you and your healthcare provider decide that you should take [place] during pregnancy, you and  your healthcare provider should discuss the known and potential benefits and the potential risks of taking LAGEVRIO during pregnancy. For individuals who are able to become pregnant: . You should use a reliable method of birth control (contraception) consistently and correctly during treatment with  LAGEVRIO and for 4 days after the last dose of LAGEVRIO. Talk to your healthcare provider about reliable birth control methods. . Before starting treatment with [facility] your healthcare provider may do a pregnancy test to see if you are pregnant before starting treatment with LAGEVRIO. . Tell your healthcare provider right away if you become pregnant or think you may be pregnant during treatment with LAGEVRIO. Pregnancy Surveillance Program: . There is a pregnancy surveillance program for individuals who take LAGEVRIO during pregnancy. The purpose of this program is to collect information about the health of you and your baby. Talk to your healthcare provider about how to take part in this program. . If you take LAGEVRIO during pregnancy and you agree to participate in the pregnancy surveillance program and allow your healthcare provider to share your information with [place], then your healthcare provider will report your use of [place] during pregnancy to [place]. by calling [phone] or PeacefulBlog.es. For individuals who are sexually active with partners who are able to become pregnant: . It is not known if LAGEVRIO can affect sperm. While the risk is regarded as low, animal studies to fully assess the potential for LAGEVRIO to affect the babies of males treated with LAGEVRIO have not been completed. A reliable method of birth control (contraception) should be used consistently and correctly during treatment with LAGEVRIO and for at least 3 months after the last dose. The risk to sperm beyond 3 months is not known. Studies to understand the risk to sperm beyond 3 months are ongoing. Talk to your healthcare provider about reliable birth control methods. Talk to your healthcare provider if you have questions or concerns about how LAGEVRIO may affect sperm. You are being given this fact sheet because your healthcare provider believes it is  necessary to provide you with LAGEVRIO for the treatment of adults with mild-to-moderate coronavirus disease 2019 (COVID-19) with positive results of direct SARS-CoV-2 viral testing, and who are at high risk for progression to severe COVID-19 including hospitalization or death, and for whom other COVID-19 treatment options approved or authorized by the FDA are not accessible or clinically appropriate. The U.S. Food and Drug Administration (FDA) has issued an Emergency Use Authorization (EUA) to make LAGEVRIO available during the COVID-19 pandemic (for more details about an EUA please see "What is an Emergency Use Authorization?" at the end of this document). LAGEVRIO is not an FDA-approved medicine in the [place]. Read this Fact Sheet for information about LAGEVRIO. Talk to your healthcare provider about your options if you have any questions. It is your choice to take LAGEVRIO.  What is COVID-19? COVID-19 is caused by a virus called a coronavirus. You can get COVID-19 through close contact with another person who has the virus. COVID-19 illnesses have ranged from very mild-to-severe, including illness resulting in death. While information so far suggests that most COVID-19 illness is mild, serious illness can happen and may cause some of your other medical conditions to become worse. Older people and people of all ages with severe, long lasting (chronic) medical conditions like heart disease, lung disease and diabetes, for example seem to be at higher risk of being hospitalized for COVID-19.  What is LAGEVRIO?  LAGEVRIO is an investigational medicine used to treat mild-to-moderate COVID-19 in adults: . with positive results of direct SARS-CoV-2 viral testing, and . who are at high risk for progression to severe COVID-19 including hospitalization or death, and for whom other COVID-19 treatment options approved or authorized by the FDA are not accessible or clinically  appropriate. The FDA has authorized the emergency use of LAGEVRIO for the treatment of mild-tomoderate COVID-19 in adults under an EUA. For more information on EUA, see the "What is an Emergency Use Authorization (EUA)?" section at the end of this Fact Sheet. LAGEVRIO is not authorized: . for use in people less than 73 years of age. . for prevention of COVID-19. . for people needing hospitalization for COVID-19. . for use for longer than 5 consecutive days.  What should I tell my healthcare provider before I take LAGEVRIO? Tell your healthcare provider if you: . Have any allergies . Are breastfeeding or plan to breastfeed . Have any serious illnesses . Are taking any medicines (prescription, over-the-counter, vitamins, or herbal products).  How do I take LAGEVRIO? Marland Kitchen Take LAGEVRIO exactly as your healthcare provider tells you to take it. . Take 4 capsules of LAGEVRIO every 12 hours (for example, at 8 am and at 8 pm) . Take LAGEVRIO for 5 days. It is important that you complete the full 5 days of treatment with LAGEVRIO. Do not stop taking LAGEVRIO before you complete the full 5 days of treatment, even if you feel better. . Take LAGEVRIO with or without food. . You should stay in isolation for as long as your healthcare provider tells you to. Talk to your healthcare provider if you are not sure about how to properly isolate while you have COVID-19. Marland Kitchen Swallow LAGEVRIO capsules whole. Do not open, break, or crush the capsules. If you cannot swallow capsules whole, tell your healthcare provider. . What to do if you miss a dose: o If it has been less than 10 hours since the missed dose, take it as soon as you remember o If it has been more than 10 hours since the missed dose, skip the missed dose and take your dose at the next scheduled time. . Do not double the dose of LAGEVRIO to make up for a missed dose.  What are the important possible side effects of LAGEVRIO? . See, "What is the  most important information I should know about LAGEVRIO?" . Allergic Reactions. Allergic reactions can happen in people taking LAGEVRIO, even after only 1 dose. Stop taking LAGEVRIO and call your healthcare provider right away if you get any of the following symptoms of an allergic reaction: o hives o rapid heartbeat o trouble swallowing or breathing o swelling of the mouth, lips, or face o throat tightness o hoarseness o skin rash The most common side effects of LAGEVRIO are: . diarrhea . nausea . dizziness These are not all the possible side effects of LAGEVRIO. Not many people have taken LAGEVRIO. Serious and unexpected side effects may happen. This medicine is still being studied, so it is possible that all of the risks are not known at this time.  What other treatment choices are there?  Veklury (remdesivir) is FDA-approved as an intravenous (IV) infusion for the treatment of mildto-moderate TLXBW-62 in certain adults and children. Talk with your doctor to see if Marijean Heath is appropriate for you. Like LAGEVRIO, FDA may also allow for the emergency use of other medicines to treat people with COVID-19. Go to LacrosseProperties.[email or web address] for more  information. It is your choice to be treated or not to be treated with LAGEVRIO. Should you decide not to take it, it will not change your standard medical care.  What if I am breastfeeding? Breastfeeding is not recommended during treatment with LAGEVRIO and for 4 days after the last dose of LAGEVRIO. If you are breastfeeding or plan to breastfeed, talk to your healthcare provider about your options and specific situation before taking LAGEVRIO.  How do I report side effects with LAGEVRIO? Contact your healthcare provider if you have any side effects that bother you or do not go away. Report side effects to FDA MedWatch at SmoothHits.hu or  call 1-800-FDA-1088 (1- [phone]).  How should I store [place]? Marland Kitchen Store LAGEVRIO capsules at room temperature between 17F to 53F (20C to 25C). Marland Kitchen Keep LAGEVRIO and all medicines out of the reach of children and pets. How can I learn more about COVID-19? Marland Kitchen Ask your healthcare provider. . Visit SeekRooms.co.uk . Contact your local or state public health department. . Call [place] at [phone] (toll free in the [place] . Visit [email or web address].com  What Is an Emergency Use Authorization (EUA)? The [place] FDA has made [place] available under an emergency access mechanism called an Emergency Use Authorization (EUA) The EUA is supported by a Presenter, broadcasting Health and Human Service (HHS) declaration that circumstances exist to justify emergency use of drugs and biological products during the COVID-19 pandemic. LAGEVRIO for the treatment of mild-to-moderate COVID-19 in adults with positive results of direct SARS-CoV-2 viral testing, who are at high risk for progression to severe COVID-19, including hospitalization or death, and for whom alternative COVID-19 treatment options approved or authorized by FDA are not accessible or clinically appropriate, has not undergone the same type of review as an FDA-approved product. In issuing an EUA under the RFVOH-60 public health emergency, the FDA has determined, among other things, that based on the total amount of scientific evidence available including data from adequate and well-controlled clinical trials, if available, it is reasonable to believe that the product may be effective for diagnosing, treating, or preventing COVID-19, or a serious or life-threatening disease or condition caused by COVID-19; that the known and potential benefits of the product, when used to diagnose, treat, or prevent such disease or condition, outweigh the known and potential risks of such product; and that there are no adequate, approved,  and available alternatives.  All of these criteria must be met to allow for the product to be used in the treatment of patients during the COVID-19 pandemic. The EUA for LAGEVRIO is in effect for the duration of the COVID-19 declaration justifying emergency use of LAGEVRIO, unless terminated or revoked (after which LAGEVRIO may no longer be used under the EUA). For patent information: http://rogers.info/ Copyright  2021-2022 [place] [place], [place] and its affiliates. All rights reserved. [id number] Revised: March 2022

## 2021-04-18 NOTE — Telephone Encounter (Signed)
Called patient this am and was able to get virtual appt with Dr. Colin Benton today at 3:40 pm; patient will still come here for covid test at 2:45 pm.

## 2021-04-18 NOTE — Progress Notes (Signed)
Virtual Visit via Video Note  I connected with Katelyn [name]  on 04/18/21 at  3:40 PM EDT by a video enabled telemedicine application and verified that I am speaking with the correct person using two identifiers.  Location patient: home, [place] Location provider:work or home office Persons participating in the virtual visit: patient, provider  I discussed the limitations of evaluation and management by telemedicine and the availability of in person appointments. The patient expressed understanding and agreed to proceed.   HPI:  Acute telemedicine visit for COVID19: -Onset: 2 days ago; positive covid test at home yesterday -had covid exposure at work, now husband is positive for covid as well -Symptoms include: nasal congestion, cough, sore throat, HA, chills, diarrhea -Denies:fever, Cp, SOB, NV, inability to eat/drink/get out of bed -Has tried: xyzal -Pertinent past medical history: HTN ber chart - but pt reports she doesn't think she has HTN, asthma -Pertinent medication allergies: Allergies  Allergen Reactions  . Adhesive [Tape] Swelling and Other (See Comments)    Reaction:  Eats in skin  . Ciprofloxacin Hives and Shortness Of Breath  . Dimethicone Hives and Shortness Of Breath  . Erythromycin Hives and Shortness Of Breath  . Latex Hives and Shortness Of Breath  . Other Hives, Shortness Of Breath and Other (See Comments)    Pt is allergic to nitrile gloves.  Pt is allergic to antibacterial soaps- rash - states she only uses Dove soap  SCDs- patient states - had itching with this   . Penicillins Hives, Shortness Of Breath and Other (See Comments)    Has patient had a PCN reaction causing immediate rash, facial/tongue/throat swelling, SOB or lightheadedness with hypotension: Yes Has patient had a PCN reaction causing severe rash involving mucus membranes or skin necrosis: No Has patient had a PCN reaction that required hospitalization No Has patient had a PCN reaction occurring within the  last 10 years: No If all of the above answers are "NO", then may proceed with Cephalosporin use.  Marland Kitchen Pineapple Anaphylaxis  . Red Dye Hives and Shortness Of Breath  . Shellfish Allergy Hives and Shortness Of Breath  . Sulfonamide Derivatives Hives and Shortness Of Breath  . Alcohol-Sulfur [Elemental Sulfur] Swelling and Other (See Comments)    Reaction:  Localized swelling  With alcohol   . Aspirin Nausea And Vomiting  . Doxycycline Nausea And Vomiting  . Hibiclens [Chlorhexidine]     Rash   . Ceftin [Cefuroxime] Rash  -COVID-19 vaccine status: not vaccinated -she reports she does not want to take Paxlovid because of the rebound she read about   ROS: See pertinent positives and negatives per HPI.  Past Medical History:  Diagnosis Date  . Anemia   . Arthritis   . Asthma   . Headache(784.0)    cluster headaches, migraines- states is taking Atenolol at present as directed per Dr Lacinda Axon  to prevent migraine from increased stress-  states if on medication for several weeks that heart rate will drop to 40's  . History of kidney stones   . HYPOTHYROIDISM 05/27/2007  . Pneumonia    post op x 2 last yrs ago  . PONV (postoperative nausea and vomiting)    likes phenergan with anesthesia, has woken up with 2 surgeries per patient   . WEIGHT GAIN 07/15/2009    Past Surgical History:  Procedure Laterality Date  . ABDOMINAL HYSTERECTOMY    . APPENDECTOMY    . CYSTOSCOPY WITH RETROGRADE PYELOGRAM, URETEROSCOPY AND STENT PLACEMENT Right 04/26/2017   Procedure:  CYSTOSCOPY WITH RETROGRADE PYELOGRAM, URETEROSCOPY AND STENT PLACEMENT;  Surgeon: Cleon Gustin, MD;  Location: [facility];  Service: Urology;  Laterality: Right;  . CYSTOSCOPY WITH RETROGRADE PYELOGRAM, URETEROSCOPY AND STENT PLACEMENT Right 08/19/2017   Procedure: CYSTOSCOPY WITH RETROGRADE PYELOGRAM, URETEROSCOPY;  Surgeon: Cleon Gustin, MD;  [facility];  Service: Urology;  Laterality: Right;  .  DIAGNOSTIC LAPAROSCOPY    . FOOT SURGERY  2007   tumor removed from right foot/   . HOLMIUM LASER APPLICATION Right 5/40/0867   Procedure: HOLMIUM LASER APPLICATION;  Surgeon: Cleon Gustin, MD;  Location: [facility];  Service: Urology;  Laterality: Right;  . KNEE ARTHROSCOPY Left    x 3  . LAPAROTOMY  10/30/2011   Procedure: EXPLORATORY LAPAROTOMY;  Surgeon: Janie Morning, MD;  Location: [facility];  Service: Gynecology;  Laterality: N/A;  . mortons neuroma removed from foot    . SALPINGOOPHORECTOMY  10/30/2011   Procedure: SALPINGO OOPHERECTOMY;  Surgeon: Janie Morning, MD;  Location: [facility];  Service: Gynecology;  Laterality: Right;  RIGHT  . SCAR DEBRIDEMENT OF TOTAL KNEE Left 11/24/2020   Procedure: Open excision of scar tissue of left total knee arthroplasty with polyethylene revision;  Surgeon: Paralee Cancel, MD;  Location: [facility];  Service: Orthopedics;  Laterality: Left;  90 mins  . TONSILLECTOMY    . TOTAL KNEE ARTHROPLASTY Left 09/25/2017   Procedure: LEFT TOTAL KNEE ARTHROPLASTY;  Surgeon: Latanya Maudlin, MD;  Location: [facility];  Service: Orthopedics;  Laterality: Left;  . TUBAL LIGATION    . TYMPANOSTOMY TUBE PLACEMENT       Current Outpatient Medications:  .  albuterol (VENTOLIN HFA) 108 (90 Base) MCG/ACT inhaler, Inhale 2 puffs into the lungs every 6 (six) hours as needed for wheezing or shortness of breath., Disp: 18 g, Rfl: 2 .  atenolol (TENORMIN) 25 MG tablet, TAKE 1 TABLET BY MOUTH EVERY MORNING FORMIGRAINES, Disp: 90 tablet, Rfl: 0 .  Biotin 1 MG CAPS, Take 1 mg by mouth daily., Disp: , Rfl:  .  Cholecalciferol (VITAMIN D3) 50 MCG (2000 UT) capsule, Take 1 capsule (2,000 Units total) by mouth daily., Disp: , Rfl:  .  Cyanocobalamin (VITAMIN B-12) 2500 MCG SUBL, Place 2,500 mcg under the tongue daily., Disp: , Rfl:  .  cyclobenzaprine (FLEXERIL) 5 MG tablet, Take 1 tablet (5 mg total) by mouth 3 (three) times daily as needed for muscle spasms., Disp: 30 tablet, Rfl:  3 .  levothyroxine (SYNTHROID) 150 MCG tablet, Take 1 tablet (150 mcg total) by mouth daily before breakfast., Disp: 90 tablet, Rfl: 1 .  methocarbamol (ROBAXIN) 500 MG tablet, Take 1 tablet (500 mg total) by mouth every 6 (six) hours as needed for muscle spasms., Disp: 40 tablet, Rfl: 0 .  mirabegron ER (MYRBETRIQ) 25 MG TB24 tablet, Take 1 tab every 4th day, Disp: , Rfl:  .  promethazine (PHENERGAN) 25 MG tablet, Take 0.5-1 tablets (12.5-25 mg total) by mouth every 6 (six) hours as needed for nausea or vomiting., Disp: 30 tablet, Rfl: 1 .  tamsulosin (FLOMAX) 0.4 MG CAPS capsule, Take 1 capsule (0.4 mg total) by mouth daily as needed (kidney stones)., Disp: 30 capsule, Rfl: 11  EXAM:  VITALS per patient if applicable:  GENERAL: alert, oriented, appears well and in no acute distress  HEENT: atraumatic, conjunttiva clear, no obvious abnormalities on inspection of external nose and ears  NECK: normal movements of the head and neck  LUNGS: on inspection no signs of respiratory  distress, breathing rate appears normal, no obvious gross SOB, gasping or wheezing  CV: no obvious cyanosis  MS: moves all visible extremities without noticeable abnormality  PSYCH/NEURO: pleasant and cooperative, no obvious depression or anxiety, speech and thought processing grossly intact  ASSESSMENT AND PLAN:  Discussed the following assessment and plan:  COVID-19   Discussed treatment options, ideal treatment window, potential complications, isolation and precautions for COVID-19.  After lengthy discussion, the patient opted for treatment with molnupiravir due to being higher risk for complications of covid or severe disease and other factors. Discussed EUA status of this drug and the fact that there is preliminary limited knowledge of risks/interactions/side effects per EUA document vs possible benefits and precautions. This information was shared with patient during the visit and also was provided in patient  instructions. Also, advised that patient discuss risks/interactions and use with pharmacist/treatment team as well.   Other symptomatic care measures summarized in patient instructions. Work/School slipped offered: provided in patient instructions   Advised to seek prompt in person care if worsening, new symptoms arise, or if is not improving with treatment. Discussed options for inperson care if PCP office not available. Did let this patient know that I only do telemedicine on Tuesdays and Thursdays for [place]. Advised to schedule follow up visit with PCP or [facility] if any further questions or concerns to avoid delays in care.   I discussed the assessment and treatment plan with the patient. The patient was provided an opportunity to ask questions and all were answered. The patient agreed with the plan and demonstrated an understanding of the instructions.     Lucretia Kern, DO

## 2021-04-19 LAB — NOVEL CORONAVIRUS, NAA: SARS-CoV-2, NAA: DETECTED — AB

## 2021-04-19 LAB — SARS-COV-2, NAA 2 DAY TAT

## 2021-05-03 ENCOUNTER — Telehealth: Payer: Self-pay

## 2021-05-03 NOTE — Telephone Encounter (Signed)
Semi completed FMLA paperwork placed in PCP's inbox for review, completion, sign and date  Pt Saw Dr Maudie Mercury virtually for COVID infection, I am not sure iDr Damita Dunnings would like a visit with the pt before filing the paperwork.Marland KitchenMarland Kitchen

## 2021-05-03 NOTE — Telephone Encounter (Signed)
Please let me look at the paperwork before scheduling the patient.  I will look at this when possible when I am back in the office.

## 2021-05-05 ENCOUNTER — Ambulatory Visit: Payer: 59 | Admitting: Internal Medicine

## 2021-05-07 DIAGNOSIS — Z0279 Encounter for issue of other medical certificate: Secondary | ICD-10-CM

## 2021-05-08 NOTE — Telephone Encounter (Signed)
Informed pt that paperwork completed and faxed  Copy mailed to pt  Copy for scan   Copy for billing   Copy retained by me

## 2021-05-11 ENCOUNTER — Encounter: Payer: Self-pay | Admitting: Family Medicine

## 2021-05-11 ENCOUNTER — Telehealth: Payer: Self-pay | Admitting: Family Medicine

## 2021-05-11 NOTE — Telephone Encounter (Signed)
There is also a mychart message that patient sent over this am; patient already submitted letter to her work. The response she got from them was that she has to have a negative antigen test done before she can go back to work. Patient states she needs to come in tomorrow for the test so she can go back to work. Once test is ordered I will schedule her accordingly.

## 2021-05-11 NOTE — Telephone Encounter (Signed)
Patient scheduled for test 05/12/21 at 2:45 pm

## 2021-05-11 NOTE — Telephone Encounter (Signed)
Please get her schedule for a covid test.  She can print the letter at home or give her a copy when she comes in for testing.  Thanks.

## 2021-05-11 NOTE — Telephone Encounter (Signed)
Did she submit the letter?  If not, then have her submit that.  If so and if she still needs testing then please get her scheduled.  Thanks.

## 2021-05-11 NOTE — Telephone Encounter (Signed)
I put in the order.  I sent the mychart message back to you.  Thanks.

## 2021-05-11 NOTE — Telephone Encounter (Signed)
See TE note

## 2021-05-11 NOTE — Telephone Encounter (Signed)
Patient came in office requesting to be retested for Covid  stated that it's still needed by her employer

## 2021-05-12 ENCOUNTER — Other Ambulatory Visit: Payer: 59

## 2021-05-12 DIAGNOSIS — Z8616 Personal history of COVID-19: Secondary | ICD-10-CM

## 2021-05-13 LAB — NOVEL CORONAVIRUS, NAA: SARS-CoV-2, NAA: NOT DETECTED

## 2021-05-13 LAB — SARS-COV-2, NAA 2 DAY TAT

## 2021-05-24 ENCOUNTER — Telehealth: Payer: Self-pay | Admitting: Family Medicine

## 2021-05-24 NOTE — Telephone Encounter (Signed)
Pt came into office to drop off health screening forms. Pt stated it needs to be back no later than 8/25. Placed in provider box

## 2021-05-29 NOTE — Telephone Encounter (Signed)
Do you have this or do you know if it was already done while I was out? I did not see anything in the folder so I am assuming it was already done maybe.

## 2021-05-31 NOTE — Telephone Encounter (Signed)
Form has been faxed off and also made copy for patient to pick up if she would like and made copy for scan. Called patient and left message for her.

## 2021-05-31 NOTE — Telephone Encounter (Signed)
Form done except for filling out her wait circumference.  Please get that data and add to the sheet then process the form.  Thanks.

## 2021-06-23 ENCOUNTER — Ambulatory Visit: Payer: 59 | Admitting: Family Medicine

## 2021-06-23 ENCOUNTER — Other Ambulatory Visit: Payer: Self-pay

## 2021-06-23 ENCOUNTER — Encounter: Payer: Self-pay | Admitting: Family Medicine

## 2021-06-23 VITALS — BP 130/80 | HR 88 | Temp 96.5°F | Ht 66.0 in | Wt 189.4 lb

## 2021-06-23 DIAGNOSIS — R059 Cough, unspecified: Secondary | ICD-10-CM | POA: Diagnosis not present

## 2021-06-23 DIAGNOSIS — Z889 Allergy status to unspecified drugs, medicaments and biological substances status: Secondary | ICD-10-CM

## 2021-06-23 DIAGNOSIS — R911 Solitary pulmonary nodule: Secondary | ICD-10-CM

## 2021-06-23 DIAGNOSIS — E559 Vitamin D deficiency, unspecified: Secondary | ICD-10-CM

## 2021-06-23 DIAGNOSIS — E039 Hypothyroidism, unspecified: Secondary | ICD-10-CM

## 2021-06-23 NOTE — Progress Notes (Signed)
This visit occurred during the SARS-CoV-2 public health emergency.  Safety protocols were in place, including screening questions prior to the visit, additional usage of staff PPE, and extensive cleaning of exam room while observing appropriate contact time as indicated for disinfecting solutions.  Hypothyroidism.  Compliant except for occasionally taking extra doses of 137 mcg tablets.  Taking 185mg levothyroxine daily o/w.  Due for f/u labs.  Weight is up and she attributed bendopathy to that.  Prev sig TSH elevation at 40. She'll feel hot episodically, sweating.    Low vit D.  Due for recheck.  Compliant.  Labs pending.    Cough.  Noted at night.  Can get lightheaded from cough.  Predates covid.  No fevers.  No sputum.  Albuterol helps the cough.  Minimal occ  wheeze noted by patient.  This could be weight/GERD related.    D/w pt about patient going to allergy clinic re: her med allergies.  She is aware of the recommendation.  H/o pulmonary nodule.  Was noted to be stable.  D/w pt about no need for f/u at this point given stability on multiple previous CTs.  Meds, vitals, and allergies reviewed.   ROS: Per HPI unless specifically indicated in ROS section   GEN: nad, alert and oriented HEENT: ncat NECK: supple w/o LA CV: rrr PULM: ctab, no inc wob ABD: soft, +bs EXT: no edema SKIN: no acute rash

## 2021-06-23 NOTE — Patient Instructions (Addendum)
Go to the lab on the way out.   If you have mychart we'll likely use that to update you.     [facility] Address: [address], [place], [place] [address] Phone: [phone]  Take care.  Glad to see you. We'll see about when to get together when I see your labs.

## 2021-06-24 LAB — T4, FREE: Free T4: 1.69 ng/dL (ref 0.82–1.77)

## 2021-06-24 LAB — TSH: TSH: 0.041 u[IU]/mL — ABNORMAL LOW (ref 0.450–4.500)

## 2021-06-24 LAB — VITAMIN D 25 HYDROXY (VIT D DEFICIENCY, FRACTURES): Vit D, 25-Hydroxy: 29.4 ng/mL — ABNORMAL LOW (ref 30.0–100.0)

## 2021-06-25 ENCOUNTER — Other Ambulatory Visit: Payer: Self-pay | Admitting: Family Medicine

## 2021-06-25 DIAGNOSIS — R911 Solitary pulmonary nodule: Secondary | ICD-10-CM | POA: Insufficient documentation

## 2021-06-25 DIAGNOSIS — R059 Cough, unspecified: Secondary | ICD-10-CM | POA: Insufficient documentation

## 2021-06-25 DIAGNOSIS — E559 Vitamin D deficiency, unspecified: Secondary | ICD-10-CM

## 2021-06-25 NOTE — Assessment & Plan Note (Signed)
See notes on labs. 

## 2021-06-25 NOTE — Assessment & Plan Note (Addendum)
Lungs are clear on exam.  Could be weight/GERD related.  Okay to use albuterol as needed.  Continue work on weight reduction.  We can get a chest x-ray to evaluate her cough but she does not need this for the previous pulmonary nodule.

## 2021-06-25 NOTE — Assessment & Plan Note (Signed)
H/o pulmonary nodule.  Was noted to be stable.  D/w pt about no need for f/u at this point given stability on multiple previous CTs.

## 2021-06-25 NOTE — Assessment & Plan Note (Signed)
D/w pt about patient going to allergy clinic re: her med allergies.  She is aware of the recommendation.

## 2021-06-25 NOTE — Assessment & Plan Note (Addendum)
I would expect variable TSH levels.  See notes on labs.  Continue levothyroxine 150 mcg daily.

## 2021-06-28 ENCOUNTER — Other Ambulatory Visit: Payer: Self-pay | Admitting: Family Medicine

## 2021-06-28 DIAGNOSIS — I10 Essential (primary) hypertension: Secondary | ICD-10-CM

## 2021-06-30 ENCOUNTER — Ambulatory Visit
Admission: RE | Admit: 2021-06-30 | Discharge: 2021-06-30 | Disposition: A | Discharge status: Home / Self Care | Payer: 59 | Source: Ambulatory Visit | Attending: Family Medicine | Admitting: Family Medicine

## 2021-06-30 DIAGNOSIS — R059 Cough, unspecified: Secondary | ICD-10-CM

## 2021-07-02 ENCOUNTER — Encounter: Payer: Self-pay | Admitting: Family Medicine

## 2021-07-04 ENCOUNTER — Encounter: Payer: Self-pay | Admitting: Family Medicine

## 2021-07-04 DIAGNOSIS — I700 Atherosclerosis of aorta: Secondary | ICD-10-CM | POA: Insufficient documentation

## 2021-07-13 ENCOUNTER — Other Ambulatory Visit: Payer: Self-pay

## 2021-07-13 ENCOUNTER — Ambulatory Visit: Payer: 59 | Admitting: Family Medicine

## 2021-07-13 ENCOUNTER — Encounter: Payer: Self-pay | Admitting: Family Medicine

## 2021-07-13 VITALS — BP 150/82 | HR 100 | Temp 97.5°F | Ht 66.0 in | Wt 190.0 lb

## 2021-07-13 DIAGNOSIS — I700 Atherosclerosis of aorta: Secondary | ICD-10-CM

## 2021-07-13 DIAGNOSIS — E039 Hypothyroidism, unspecified: Secondary | ICD-10-CM

## 2021-07-13 DIAGNOSIS — E785 Hyperlipidemia, unspecified: Secondary | ICD-10-CM

## 2021-07-13 MED ORDER — ATORVASTATIN CALCIUM 10 MG PO TABS: 10.0000 mg | ORAL_TABLET | Freq: Every day | ORAL | 3 refills | Status: DC

## 2021-07-13 NOTE — Progress Notes (Signed)
This visit occurred during the SARS-CoV-2 public health emergency.  Safety protocols were in place, including screening questions prior to the visit, additional usage of staff PPE, and extensive cleaning of exam room while observing appropriate contact time as indicated for disinfecting solutions.  CXR with atherosclerotic changes in the aorta.  D/w pt.    The 10-year ASCVD risk score Mikey Bussing DC Brooke Bonito., et al., 2013) is: 11.1%   Values used to calculate the score:     Age: 17 years     Sex: Female     Is Non-Hispanic African American: No     Diabetic: No     Tobacco smoker: No     Systolic Blood Pressure: [id number] mmHg     Is BP treated: Yes     HDL Cholesterol: 43 mg/dL     Total Cholesterol: 231 mg/dL  D/w pt about statin indication.  Discussed possible aches on med.  Discussed plan to start statin and have her update me as needed.  We can check her lipids and liver profile in a few months, assuming she tolerates the medication without aches.  She is frustrated about weight gain in the midst of thyroid dysfunction.  I think it makes sense to see endo instead of starting phentermine for her weight, discussed.  She is frustrated and tearful about her situation (especially weight gain).  She was asking about taking phentermine.  I explained the rationale that phentermine would not address her thyroid situation and it makes sense to treat the underlying issue.  I did not prescribe phentermine for patient.  Meds, vitals, and allergies reviewed.   ROS: Per HPI unless specifically indicated in ROS section   GEN: nad, alert and oriented, tearful but regains composure HEENT: ncat NECK: supple w/o LA CV: rrr PULM: ctab, no inc wob ABD: soft, +bs SKIN: Well-perfused.

## 2021-07-13 NOTE — Patient Instructions (Addendum)
Try atorvastatin and recheck labs in about 2 months if you tolerate the medication without getting muscle aches.   [place] statin phone number [phone] They should be able to collect the labs.  Please call for a fasting lab appointment.   I'll await the notes from Dr. Loanne Drilling.  Take care.  Glad to see you.

## 2021-07-14 ENCOUNTER — Ambulatory Visit: Payer: 59 | Admitting: Family Medicine

## 2021-07-16 NOTE — Assessment & Plan Note (Signed)
Will await endocrine follow-up.  I did not prescribe phentermine.  See above discussion.30 minutes were devoted to patient care in this encounter (this includes time spent reviewing the patient's file/history, interviewing and examining the patient, counseling/reviewing plan with patient).

## 2021-07-16 NOTE — Assessment & Plan Note (Signed)
  D/w pt about statin indication.  Discussed possible aches on med.  Discussed plan to start statin and have her update me as needed.  We can check her lipids and liver profile in a few months, assuming she tolerates the medication without aches.  Start atorvastatin 10 mg a day.  See orders.  See after visit summary.

## 2021-07-28 ENCOUNTER — Ambulatory Visit: Payer: 59 | Admitting: Endocrinology

## 2021-07-28 ENCOUNTER — Encounter: Payer: Self-pay | Admitting: Endocrinology

## 2021-07-28 ENCOUNTER — Other Ambulatory Visit: Payer: Self-pay

## 2021-07-28 VITALS — BP 170/90 | HR 90 | Ht 66.0 in | Wt 190.6 lb

## 2021-07-28 DIAGNOSIS — E039 Hypothyroidism, unspecified: Secondary | ICD-10-CM | POA: Diagnosis not present

## 2021-07-28 NOTE — Progress Notes (Signed)
Subjective:    Patient ID: Katelyn [name], female    DOB: 15-Nov-1955, 65 y.o.   MRN: [id number]  HPI Pt is referred by Dr Damita Dunnings, for hypothyroidism.  Pt reports hypothyroidism was dx'ed in 1974.  she has been on prescribed thyroid hormone therapy since soon after dx.  she has never taken kelp or any other type of non-prescribed thyroid product.  she has never had dedicated thyroid imaging.  she has never had thyroid surgery, or XRT to the neck.  she has never been on amiodarone or lithium, but she takes biotin.  She has been on her current 150 mcg/d, since 5/22.  She reports widely varying TFT x years.  She reports heat intolerance, hair loss, chronic weight weight gain, and dry skin. She is unaware of any reason why TFT have varied so widely.  Over the past few years, dosage has varied from 137-200 mcg/d.  Pt says she takes synthroid with water only, fasting.  She takes brand-name synthroid.   Past Medical History:  Diagnosis Date   Anemia    Arthritis    Asthma    Headache(784.0)    cluster headaches, migraines- states is taking Atenolol at present as directed per Dr Lacinda Axon  to prevent migraine from increased stress-  states if on medication for several weeks that heart rate will drop to 40's   History of kidney stones    HYPOTHYROIDISM 05/27/2007   Pneumonia    post op x 2 last yrs ago   PONV (postoperative nausea and vomiting)    likes phenergan with anesthesia, has woken up with 2 surgeries per patient    WEIGHT GAIN 07/15/2009    Past Surgical History:  Procedure Laterality Date   ABDOMINAL HYSTERECTOMY     APPENDECTOMY     CYSTOSCOPY WITH RETROGRADE PYELOGRAM, URETEROSCOPY AND STENT PLACEMENT Right 04/26/2017   Procedure: [place], URETEROSCOPY AND STENT PLACEMENT;  Surgeon: Cleon Gustin, MD;  Location: [facility];  Service: Urology;  Laterality: Right;   CYSTOSCOPY WITH RETROGRADE PYELOGRAM, URETEROSCOPY AND STENT PLACEMENT Right 08/19/2017    Procedure: CYSTOSCOPY WITH RETROGRADE PYELOGRAM, URETEROSCOPY;  Surgeon: Cleon Gustin, MD;  Location: [facility];  Service: Urology;  Laterality: Right;   DIAGNOSTIC LAPAROSCOPY     FOOT SURGERY  2007   tumor removed from right foot/    HOLMIUM LASER APPLICATION Right [id number]   Procedure: HOLMIUM LASER APPLICATION;  Surgeon: Cleon Gustin, MD;  Location: [facility];  Service: Urology;  Laterality: Right;   KNEE ARTHROSCOPY Left    x 3   LAPAROTOMY  10/30/2011   Procedure: EXPLORATORY LAPAROTOMY;  Surgeon: Janie Morning, MD;  Location: [facility];  Service: Gynecology;  Laterality: N/A;   mortons neuroma removed from foot     SALPINGOOPHORECTOMY  10/30/2011   Procedure: SALPINGO OOPHERECTOMY;  Surgeon: Janie Morning, MD;  Location: [facility];  Service: Gynecology;  Laterality: Right;  RIGHT   SCAR DEBRIDEMENT OF TOTAL KNEE Left 11/24/2020   Procedure: Open excision of scar tissue of left total knee arthroplasty with polyethylene revision;  Surgeon: Paralee Cancel, MD;  Location: [facility];  Service: Orthopedics;  Laterality: Left;  90 mins   TONSILLECTOMY     TOTAL KNEE ARTHROPLASTY Left 09/25/2017   Procedure: LEFT TOTAL KNEE ARTHROPLASTY;  Surgeon: Latanya Maudlin, MD;  Location: [facility];  Service: Orthopedics;  Laterality: Left;   TUBAL LIGATION     TYMPANOSTOMY TUBE PLACEMENT      Social History  Socioeconomic History   Marital status: Married    Spouse name: Not on file   Number of children: Not on file   Years of education: Not on file   Highest education level: Not on file  Occupational History   Not on file  Tobacco Use   Smoking status: Never   Smokeless tobacco: Never  Vaping Use   Vaping Use: Never used  Substance and Sexual Activity   Alcohol use: No   Drug use: No   Sexual activity: Yes  Other Topics Concern   Not on file  Social History Narrative   Married   Social Determinants of Health   Financial Resource Strain: Not on file  Food  Insecurity: Not on file  Transportation Needs: Not on file  Physical Activity: Not on file  Stress: Not on file  Social Connections: Not on file  Intimate Partner Violence: Not on file    Current Outpatient Medications on File Prior to Visit  Medication Sig Dispense Refill   albuterol (VENTOLIN HFA) 108 (90 Base) MCG/ACT inhaler Inhale 2 puffs into the lungs every 6 (six) hours as needed for wheezing or shortness of breath. 18 g 2   atenolol (TENORMIN) 25 MG tablet TAKE 1 TABLET BY MOUTH EVERY MORNING FORMIGRAINES 90 tablet 0   atorvastatin (LIPITOR) 10 MG tablet Take 1 tablet (10 mg total) by mouth daily. 90 tablet 3   Biotin 1 MG CAPS Take 1 mg by mouth daily.     Cholecalciferol (VITAMIN D3) 50 MCG (2000 UT) capsule Take 1 capsule (2,000 Units total) by mouth daily.     Cyanocobalamin (VITAMIN B-12) 2500 MCG SUBL Place 2,500 mcg under the tongue daily.     cyclobenzaprine (FLEXERIL) 5 MG tablet Take 1 tablet (5 mg total) by mouth 3 (three) times daily as needed for muscle spasms. 30 tablet 3   levothyroxine (SYNTHROID) 150 MCG tablet Take 1 tablet (150 mcg total) by mouth daily before breakfast. 90 tablet 1   mirabegron ER (MYRBETRIQ) 25 MG TB24 tablet Take 1 tab every 4th day     tamsulosin (FLOMAX) 0.4 MG CAPS capsule Take 1 capsule (0.4 mg total) by mouth daily as needed (kidney stones). 30 capsule 11   No current facility-administered medications on file prior to visit.    Allergies  Allergen Reactions   Adhesive [Tape] Swelling and Other (See Comments)    Reaction:  Eats in skin   Ciprofloxacin Hives and Shortness Of Breath   Dimethicone Hives and Shortness Of Breath   Erythromycin Hives and Shortness Of Breath   Latex Hives and Shortness Of Breath   Methocarbamol Hives   Other Hives, Shortness Of Breath and Other (See Comments)    Pt is allergic to nitrile gloves.  Pt is allergic to antibacterial soaps- rash - states she only uses Dove soap  SCDs- patient states - had  itching with this    Penicillins Hives, Shortness Of Breath and Other (See Comments)    Has patient had a PCN reaction causing immediate rash, facial/tongue/throat swelling, SOB or lightheadedness with hypotension: Yes Has patient had a PCN reaction causing severe rash involving mucus membranes or skin necrosis: No Has patient had a PCN reaction that required hospitalization No Has patient had a PCN reaction occurring within the last 10 years: No If all of the above answers are "NO", then may proceed with Cephalosporin use.   Pineapple Anaphylaxis   Red Dye Hives and Shortness Of Breath   Shellfish Allergy Hives  and Shortness Of Breath   Sulfonamide Derivatives Hives and Shortness Of Breath   Alcohol-Sulfur [Elemental Sulfur] Swelling and Other (See Comments)    Reaction:  Localized swelling  With alcohol    Aspirin Nausea And Vomiting   Doxycycline Nausea And Vomiting   Hibiclens [Chlorhexidine]     Rash    Ceftin [Cefuroxime] Rash    Family History  Problem Relation Age of Onset   Thyroid disease Mother    Alzheimer's disease Mother    Cervical cancer Mother    Thyroid disease Father    Diabetes Sister    Heart attack Brother    Hypertension Neg Hx        family hx   Heart disease Neg Hx        family hx    BP (!) 170/90 (BP Location: Right Arm, Patient Position: Sitting, Cuff Size: Normal)   Pulse 90   Ht '5\' 6"'$  (1.676 m)   Wt 190 lb 9.6 oz (86.5 kg)   SpO2 95%   BMI 30.76 kg/m    Review of Systems denies depression, memory loss, constipation.      Objective:   Physical Exam VS: see vs page GEN: no distress HEAD: head: no deformity eyes: no periorbital swelling, no proptosis external nose and ears are normal NECK: thyroid is minimally enlarged, with irreg surface.   CHEST WALL: no deformity LUNGS: clear to auscultation CV: reg rate and rhythm, no murmur.  MUSCULOSKELETAL: gait is steady, with crutches.   EXTEMITIES: no deformity.  1+ bilat leg  edema NEURO:  readily moves all 4's.  sensation is intact to touch on all 4's SKIN:  Normal texture and temperature.  No rash or suspicious lesion is visible.   NODES:  None palpable at the neck PSYCH: alert, well-oriented.  Does not appear anxious nor depressed.    TPO=78 CT chest (2018): normal thyroid  I have reviewed outside records, and summarized: Pt was noted to have abnormal TFT, and referred here.     Assessment & Plan:  Hypothyroidism, with widely variable TFT. uncertain reason for this.    Patient Instructions  Blood tests are requested for you today.  We'll let you know about the results.  Our strategy will be to minimize the degree and frequency of medication changes.  Please come back for a follow-up appointment in 2 months.

## 2021-07-28 NOTE — Patient Instructions (Addendum)
Blood tests are requested for you today.  We'll let you know about the results.  Our strategy will be to minimize the degree and frequency of medication changes.  Please come back for a follow-up appointment in 2 months.

## 2021-08-01 ENCOUNTER — Other Ambulatory Visit: Payer: Self-pay | Admitting: Endocrinology

## 2021-08-01 ENCOUNTER — Other Ambulatory Visit (INDEPENDENT_AMBULATORY_CARE_PROVIDER_SITE_OTHER): Payer: 59

## 2021-08-01 ENCOUNTER — Other Ambulatory Visit: Payer: Self-pay

## 2021-08-01 DIAGNOSIS — E039 Hypothyroidism, unspecified: Secondary | ICD-10-CM

## 2021-08-02 ENCOUNTER — Other Ambulatory Visit: Payer: Self-pay | Admitting: Endocrinology

## 2021-08-02 LAB — TSH: TSH: 0.092 u[IU]/mL — ABNORMAL LOW (ref 0.450–4.500)

## 2021-08-02 MED ORDER — LEVOTHYROXINE SODIUM 125 MCG PO TABS: 125.0000 ug | ORAL_TABLET | Freq: Every day | ORAL | 3 refills | Status: DC

## 2021-08-11 ENCOUNTER — Ambulatory Visit: Payer: 59 | Admitting: Family Medicine

## 2021-08-18 ENCOUNTER — Ambulatory Visit: Payer: 59 | Admitting: Family Medicine

## 2021-08-18 ENCOUNTER — Encounter: Payer: Self-pay | Admitting: Family Medicine

## 2021-08-18 ENCOUNTER — Other Ambulatory Visit: Payer: Self-pay

## 2021-08-18 VITALS — BP 164/84 | HR 98 | Temp 97.9°F | Ht 66.0 in | Wt 193.0 lb

## 2021-08-18 DIAGNOSIS — E039 Hypothyroidism, unspecified: Secondary | ICD-10-CM

## 2021-08-18 NOTE — Progress Notes (Signed)
This visit occurred during the SARS-CoV-2 public health emergency.  Safety protocols were in place, including screening questions prior to the visit, additional usage of staff PPE, and extensive cleaning of exam room while observing appropriate contact time as indicated for disinfecting solutions.  She had endo f/u prev.  She has been taking her thyroid medication at baseline on empty stomach.  She had f/u TSH in the meantime, upon rescheduling with lab.  She had trouble getting blood drawn given her allergies.  She refuses to f/u with Dr. Loanne Drilling based on the interaction at the office visit.  Per patient her questions and concerns about hashimoto's thyroiditis were not addressed.    She couldn't tolerate atorvastatin, even QOD dosing with myalgias.  She stopped the medication in the meantime and updated her allergy list.  She has post nasal gtt in the setting of mult neg covid tests recently.  No fevers.    We talked about options for blood draws.  She can clearly tolerate typical betadine.  D/w pt.     Meds, vitals, and allergies reviewed.   ROS: Per HPI unless specifically indicated in ROS section   Nad Ncat Neck supple, no LA Rrr Ctab Abd soft Skin well perfused.

## 2021-08-18 NOTE — Patient Instructions (Addendum)
Go to the lab on the way out.   If you have mychart we'll likely use that to update you.    Take care.  Glad to see you. Let me see about endocrine referral options.

## 2021-08-19 LAB — TSH: TSH: 0.708 u[IU]/mL (ref 0.450–4.500)

## 2021-08-20 ENCOUNTER — Encounter: Payer: Self-pay | Admitting: Family Medicine

## 2021-08-20 NOTE — Assessment & Plan Note (Addendum)
She has a history of TPO positive antibody in the setting of longstanding fluctuating TSH levels.  I need endocrine input for options going forward.  She declines to follow-up with Dr. Loanne Drilling based on her report of the way she was treated at the last office visit.  Refer for second opinion.  I need endocrinology input.  Reasonable to recheck TSH today.  See notes on labs.

## 2021-09-01 DIAGNOSIS — E063 Autoimmune thyroiditis: Secondary | ICD-10-CM | POA: Insufficient documentation

## 2021-09-28 ENCOUNTER — Other Ambulatory Visit: Payer: Self-pay | Admitting: Family Medicine

## 2021-09-28 DIAGNOSIS — I10 Essential (primary) hypertension: Secondary | ICD-10-CM

## 2021-09-29 ENCOUNTER — Ambulatory Visit: Payer: 59 | Admitting: Endocrinology

## 2021-10-04 ENCOUNTER — Telehealth: Payer: Self-pay

## 2021-10-04 ENCOUNTER — Other Ambulatory Visit (HOSPITAL_COMMUNITY): Payer: Self-pay

## 2021-10-04 NOTE — Telephone Encounter (Signed)
Patient Advocate Encounter   Received notification from the pharmacy that prior authorization for Synthroid 124mcg tabs is required by his/her insurance [employer].  PA submitted on 10/04/21  Key [id number]  Status is pending    [facility] will continue to follow:  Patient Advocate Fax:  [phone]

## 2021-10-06 ENCOUNTER — Other Ambulatory Visit (HOSPITAL_COMMUNITY): Payer: Self-pay

## 2021-10-06 NOTE — Telephone Encounter (Signed)
Patient Advocate Encounter  Prior Authorization for Synthroid 176mcg tabs has been approved.    PA#: [id number]  Effective dates: 04/14/21 through 04/14/22  Per Test Claim Patients co-pay is N/A.   Must fill w/[facility] mail order for 90 days  Patient Advocate Fax:  [phone]

## 2021-10-16 ENCOUNTER — Telehealth: Payer: 59 | Admitting: Physician Assistant

## 2021-10-16 DIAGNOSIS — J069 Acute upper respiratory infection, unspecified: Secondary | ICD-10-CM | POA: Diagnosis not present

## 2021-10-16 MED ORDER — BENZONATATE 100 MG PO CAPS: 100.0000 mg | ORAL_CAPSULE | Freq: Three times a day (TID) | ORAL | 0 refills | Status: DC | PRN

## 2021-10-16 MED ORDER — IPRATROPIUM BROMIDE 0.03 % NA SOLN: 2.0000 | Freq: Two times a day (BID) | NASAL | 0 refills | Status: AC

## 2021-10-16 NOTE — Progress Notes (Signed)

## 2021-10-19 ENCOUNTER — Encounter: Payer: Self-pay | Admitting: Family Medicine

## 2021-10-22 ENCOUNTER — Other Ambulatory Visit: Payer: Self-pay | Admitting: Family Medicine

## 2021-10-22 MED ORDER — PROMETHAZINE HCL 25 MG PO TABS: 12.5000 mg | ORAL_TABLET | Freq: Three times a day (TID) | ORAL | 0 refills | Status: DC | PRN

## 2022-01-03 ENCOUNTER — Other Ambulatory Visit: Payer: Self-pay | Admitting: Family Medicine

## 2022-01-03 DIAGNOSIS — I10 Essential (primary) hypertension: Secondary | ICD-10-CM

## 2022-03-16 ENCOUNTER — Ambulatory Visit (HOSPITAL_COMMUNITY)
Admission: RE | Admit: 2022-03-16 | Discharge: 2022-03-16 | Disposition: A | Discharge status: Home / Self Care | Payer: Medicare Other | Source: Ambulatory Visit | Attending: Urology | Admitting: Urology

## 2022-03-16 DIAGNOSIS — N200 Calculus of kidney: Secondary | ICD-10-CM | POA: Diagnosis present

## 2022-03-23 ENCOUNTER — Ambulatory Visit (INDEPENDENT_AMBULATORY_CARE_PROVIDER_SITE_OTHER): Payer: Medicare Other | Admitting: Urology

## 2022-03-23 VITALS — BP 184/89 | HR 96

## 2022-03-23 DIAGNOSIS — N200 Calculus of kidney: Secondary | ICD-10-CM

## 2022-03-23 DIAGNOSIS — M549 Dorsalgia, unspecified: Secondary | ICD-10-CM

## 2022-03-23 DIAGNOSIS — Z87442 Personal history of urinary calculi: Secondary | ICD-10-CM | POA: Diagnosis not present

## 2022-03-23 LAB — URINALYSIS, ROUTINE W REFLEX MICROSCOPIC
Bilirubin, UA: NEGATIVE
Glucose, UA: NEGATIVE
Ketones, UA: NEGATIVE
Leukocytes,UA: NEGATIVE
Nitrite, UA: NEGATIVE
Protein,UA: NEGATIVE
Specific Gravity, UA: >1.03 — ABNORMAL HIGH (ref 1.005–1.030)
Urobilinogen, Ur: 0.2 mg/dL (ref 0.2–1.0)
pH, UA: 5.5 (ref 5.0–7.5)

## 2022-03-23 LAB — MICROSCOPIC EXAMINATION: Renal Epithel, UA: NONE SEEN /hpf

## 2022-03-23 MED ORDER — CYCLOBENZAPRINE HCL 5 MG PO TABS: 5.0000 mg | ORAL_TABLET | Freq: Three times a day (TID) | ORAL | 4 refills | Status: AC | PRN

## 2022-03-23 MED ORDER — MIRABEGRON ER 25 MG PO TB24: ORAL_TABLET | ORAL | 3 refills | Status: DC

## 2022-03-23 NOTE — Progress Notes (Signed)
03/23/2022 1:21 PM   Katelyn [name] 09/06/1956 [id number]  Referring provider: Tonia Ghent, MD [place],  [place] [address]  Followup nephrolithiasis  HPI: Katelyn [name] is a 66yo here for followup for nephrolithiasis and back pain. No stone events since last visit. No flank pain. Renal US 03/16/2022 shows no calculi and no hydronephrosis. No significant LUTS. She has intermittent back pain which she uses flexeril prn with good results.    PMH: Past Medical History:  Diagnosis Date   Anemia    Arthritis    Asthma    Headache(784.0)    cluster headaches, migraines- states is taking Atenolol at present as directed per Dr Lacinda Axon  to prevent migraine from increased stress-  states if on medication for several weeks that heart rate will drop to 40's   History of kidney stones    HYPOTHYROIDISM 05/27/2007   Pneumonia    post op x 2 last yrs ago   PONV (postoperative nausea and vomiting)    likes phenergan with anesthesia, has woken up with 2 surgeries per patient    WEIGHT GAIN 07/15/2009    Surgical History: Past Surgical History:  Procedure Laterality Date   ABDOMINAL HYSTERECTOMY     APPENDECTOMY     CYSTOSCOPY WITH RETROGRADE PYELOGRAM, URETEROSCOPY AND STENT PLACEMENT Right 04/26/2017   Procedure: [place], URETEROSCOPY AND STENT PLACEMENT;  Surgeon: Cleon Gustin, MD;  Location: [facility];  Service: Urology;  Laterality: Right;   CYSTOSCOPY WITH RETROGRADE PYELOGRAM, URETEROSCOPY AND STENT PLACEMENT Right 08/19/2017   Procedure: CYSTOSCOPY WITH RETROGRADE PYELOGRAM, URETEROSCOPY;  Surgeon: Cleon Gustin, MD;  Location: [facility];  Service: Urology;  Laterality: Right;   DIAGNOSTIC LAPAROSCOPY     FOOT SURGERY  2007   tumor removed from right foot/    HOLMIUM LASER APPLICATION Right 05/24/4579   Procedure: HOLMIUM LASER APPLICATION;  Surgeon: Cleon Gustin, MD;  Location: [facility];  Service:  Urology;  Laterality: Right;   KNEE ARTHROSCOPY Left    x 3   LAPAROTOMY  10/30/2011   Procedure: EXPLORATORY LAPAROTOMY;  Surgeon: Janie Morning, MD;  Location: [facility];  Service: Gynecology;  Laterality: N/A;   mortons neuroma removed from foot     SALPINGOOPHORECTOMY  10/30/2011   Procedure: SALPINGO OOPHERECTOMY;  Surgeon: Janie Morning, MD;  Location: [facility];  Service: Gynecology;  Laterality: Right;  RIGHT   SCAR DEBRIDEMENT OF TOTAL KNEE Left 11/24/2020   Procedure: Open excision of scar tissue of left total knee arthroplasty with polyethylene revision;  Surgeon: Paralee Cancel, MD;  Location: [facility];  Service: Orthopedics;  Laterality: Left;  90 mins   TONSILLECTOMY     TOTAL KNEE ARTHROPLASTY Left 09/25/2017   Procedure: LEFT TOTAL KNEE ARTHROPLASTY;  Surgeon: Latanya Maudlin, MD;  Location: [facility];  Service: Orthopedics;  Laterality: Left;   TUBAL LIGATION     TYMPANOSTOMY TUBE PLACEMENT      Home Medications:  Allergies as of 03/23/2022       Reactions   Adhesive [tape] Swelling, Other (See Comments)   Reaction:  Eats in skin   Ciprofloxacin Hives, Shortness Of Breath   Dimethicone Hives, Shortness Of Breath   Erythromycin Hives, Shortness Of Breath   Latex Hives, Shortness Of Breath   Methocarbamol Hives   Other Hives, Shortness Of Breath, Other (See Comments)   Pt is allergic to nitrile gloves.  Pt is allergic to antibacterial soaps- rash - states she only  uses [employer]  SCDs- patient states - had itching with this    Penicillins Hives, Shortness Of Breath, Other (See Comments)   Has patient had a PCN reaction causing immediate rash, facial/tongue/throat swelling, SOB or lightheadedness with hypotension: Yes Has patient had a PCN reaction causing severe rash involving mucus membranes or skin necrosis: No Has patient had a PCN reaction that required hospitalization No Has patient had a PCN reaction occurring within the last 10 years: No If all of the above answers are  "NO", then may proceed with Cephalosporin use.   Pineapple Anaphylaxis   Red Dye Hives, Shortness Of Breath   Shellfish Allergy Hives, Shortness Of Breath   Able to tolerate topical betadine.     Sulfonamide Derivatives Hives, Shortness Of Breath   Alcohol-sulfur [elemental Sulfur] Swelling, Other (See Comments)   Reaction:  Localized swelling  With alcohol   Aspirin Nausea And Vomiting   Atorvastatin    myalgias   Doxycycline Nausea And Vomiting   Hibiclens [chlorhexidine]    Rash    Ceftin [cefuroxime] Rash        Medication List        Accurate as of Mar 23, 2022  1:21 PM. If you have any questions, ask your nurse or doctor.          albuterol 108 (90 Base) MCG/ACT inhaler Commonly known as: VENTOLIN HFA Inhale 2 puffs into the lungs every 6 (six) hours as needed for wheezing or shortness of breath.   atenolol 25 MG tablet Commonly known as: TENORMIN TAKE 1 TABLET BY MOUTH EVERY MORNING FORMIGRAINES   benzonatate 100 MG capsule Commonly known as: TESSALON Take 1 capsule (100 mg total) by mouth 3 (three) times daily as needed.   Biotin 1 MG Caps Take 1 mg by mouth daily.   cyclobenzaprine 5 MG tablet Commonly known as: FLEXERIL Take 1 tablet (5 mg total) by mouth 3 (three) times daily as needed for muscle spasms.   ipratropium 0.03 % nasal spray Commonly known as: ATROVENT Place 2 sprays into both nostrils every 12 (twelve) hours.   levothyroxine 125 MCG tablet Commonly known as: SYNTHROID Take 1 tablet (125 mcg total) by mouth daily.   mirabegron ER 25 MG Tb24 tablet Commonly known as: MYRBETRIQ Take 1 tab every 4th day   promethazine 25 MG tablet Commonly known as: PHENERGAN Take 0.5-1 tablets (12.5-25 mg total) by mouth every 8 (eight) hours as needed for nausea or vomiting.   tamsulosin 0.4 MG Caps capsule Commonly known as: FLOMAX Take 1 capsule (0.4 mg total) by mouth daily as needed (kidney stones).   Vitamin B-12 2500 MCG Subl Place 2,500  mcg under the tongue daily.   Vitamin D3 50 MCG (2000 UT) capsule Take 1 capsule (2,000 Units total) by mouth daily.        Allergies:  Allergies  Allergen Reactions   Adhesive [Tape] Swelling and Other (See Comments)    Reaction:  Eats in skin   Ciprofloxacin Hives and Shortness Of Breath   Dimethicone Hives and Shortness Of Breath   Erythromycin Hives and Shortness Of Breath   Latex Hives and Shortness Of Breath   Methocarbamol Hives   Other Hives, Shortness Of Breath and Other (See Comments)    Pt is allergic to nitrile gloves.  Pt is allergic to antibacterial soaps- rash - states she only uses Dove soap  SCDs- patient states - had itching with this    Penicillins Hives, Shortness Of Breath and  Other (See Comments)    Has patient had a PCN reaction causing immediate rash, facial/tongue/throat swelling, SOB or lightheadedness with hypotension: Yes Has patient had a PCN reaction causing severe rash involving mucus membranes or skin necrosis: No Has patient had a PCN reaction that required hospitalization No Has patient had a PCN reaction occurring within the last 10 years: No If all of the above answers are "NO", then may proceed with Cephalosporin use.   Pineapple Anaphylaxis   Red Dye Hives and Shortness Of Breath   Shellfish Allergy Hives and Shortness Of Breath    Able to tolerate topical betadine.     Sulfonamide Derivatives Hives and Shortness Of Breath   Alcohol-Sulfur [Elemental Sulfur] Swelling and Other (See Comments)    Reaction:  Localized swelling  With alcohol    Aspirin Nausea And Vomiting   Atorvastatin     myalgias   Doxycycline Nausea And Vomiting   Hibiclens [Chlorhexidine]     Rash    Ceftin [Cefuroxime] Rash    Family History: Family History  Problem Relation Age of Onset   Thyroid disease Mother    Alzheimer's disease Mother    Cervical cancer Mother    Thyroid disease Father    Diabetes Sister    Heart attack Brother    Hypertension Neg  Hx        family hx   Heart disease Neg Hx        family hx    Social History:  reports that she has never smoked. She has never used smokeless tobacco. She reports that she does not drink alcohol and does not use drugs.  ROS: All other review of systems were reviewed and are negative except what is noted above in HPI  Physical Exam: BP (!) 184/89   Pulse 96   Constitutional:  Alert and oriented, No acute distress. HEENT: [place] AT, moist mucus membranes.  Trachea midline, no masses. Cardiovascular: No clubbing, cyanosis, or edema. Respiratory: Normal respiratory effort, no increased work of breathing. GI: Abdomen is soft, nontender, nondistended, no abdominal masses GU: No CVA tenderness.  Lymph: No cervical or inguinal lymphadenopathy. Skin: No rashes, bruises or suspicious lesions. Neurologic: Grossly intact, no focal deficits, moving all 4 extremities. Psychiatric: Normal mood and affect.  Laboratory Data: Lab Results  Component Value Date   WBC 7.9 04/07/2021   HGB 13.7 04/07/2021   HCT 40.9 04/07/2021   MCV 85 04/07/2021   PLT 305 04/07/2021    Lab Results  Component Value Date   CREATININE 0.75 04/07/2021    No results found for: PSA  No results found for: TESTOSTERONE  Lab Results  Component Value Date   HGBA1C 5.6 11/02/2020    Urinalysis    Component Value Date/Time   COLORURINE YELLOW 09/28/2017 0126   APPEARANCEUR Clear 03/23/2022 1301   LABSPEC 1.010 09/28/2017 0126   PHURINE 6.0 09/28/2017 0126   GLUCOSEU Negative 03/23/2022 1301   HGBUR MODERATE (A) 09/28/2017 0126   BILIRUBINUR Negative 03/23/2022 1301   KETONESUR 20 (A) 09/28/2017 0126   PROTEINUR Negative 03/23/2022 1301   PROTEINUR NEGATIVE 09/28/2017 0126   UROBILINOGEN 0.2 11/02/2020 1434   UROBILINOGEN 0.2 08/30/2011 2014   NITRITE Negative 03/23/2022 1301   NITRITE NEGATIVE 09/28/2017 0126   LEUKOCYTESUR Negative 03/23/2022 1301    Lab Results  Component Value Date   LABMICR  See below: 03/23/2022   WBCUA 0-5 03/23/2022   LABEPIT 0-10 03/23/2022   MUCUS Present 03/24/2021   BACTERIA Few (  A) 03/23/2022    Pertinent Imaging: Renal US 03/16/2022: Images reviewed and discussed with the patient  Results for orders placed during the hospital encounter of 07/22/17  DG Abdomen 1 View  Narrative CLINICAL DATA:  Left flank pain and patient with a known history of urinary tract stones.  EXAM: ABDOMEN - 1 VIEW  COMPARISON:  CT abdomen and pelvis 04/23/2017.  FINDINGS: A subtle 0.5 cm density projecting in the mid pole of the right kidney may be a nonobstructing stone. No other evidence of urinary tract stone is identified. Punctate pelvic phlebolith is noted. The bowel gas pattern is unremarkable. No focal bony abnormality.  IMPRESSION: Possible 0.5 cm nonobstructing stone midpole right kidney. No other evidence of urinary tract stone.   Electronically Signed By: Inge Rise M.D. On: 07/22/2017 02:07  No results found for this or any previous visit.  No results found for this or any previous visit.  No results found for this or any previous visit.  Results for orders placed during the hospital encounter of 03/16/22  Ultrasound renal complete  Narrative CLINICAL DATA:  Nephrolithiasis.  EXAM: RENAL / URINARY TRACT ULTRASOUND COMPLETE  COMPARISON:  Renal ultrasound 03/17/2021.  FINDINGS: Right Kidney:  Renal measurements: 11.5 x 5.7 x 5.1 cm = volume: 177 mL. Echogenicity within normal limits. No hydronephrosis. There is a 1.7 x 1.7 x 1.7 cm cyst in the inferior pole similar to the prior study.  Left Kidney:  Renal measurements: 11.8 x 6.3 x 5.0 cm = volume: 194 mL. Echogenicity within normal limits. No hydronephrosis. Small left peripelvic cysts again noted.  Bladder:  Appears normal for degree of bladder distention.  Other:  None.  IMPRESSION: 1. No hydronephrosis. 2. Bilateral cysts.   Electronically Signed By: Ronney Asters M.D. On: 03/18/2022 01:41  No results found for this or any previous visit.  No results found for this or any previous visit.  Results for orders placed during the hospital encounter of 04/23/17  CT Renal Stone Study  Narrative CLINICAL DATA:  Acute onset of right flank pain. Leukocytosis. Red blood cells and white blood cells in the urine. Initial encounter.  EXAM: CT ABDOMEN AND PELVIS WITHOUT CONTRAST  TECHNIQUE: Multidetector CT imaging of the abdomen and pelvis was performed following the standard protocol without IV contrast.  COMPARISON:  CT of the abdomen and pelvis performed 01/08/2017, and pelvic ultrasound performed 08/31/2011  FINDINGS: Lower chest: Mild bibasilar atelectasis is noted. Calcification is seen at the mitral valve.  Hepatobiliary: There is diffuse fatty infiltration within the liver. The gallbladder is unremarkable in appearance. The common bile duct remains normal in size.  Pancreas: The pancreas is within normal limits.  Spleen: The spleen is unremarkable in appearance.  Adrenals/Urinary Tract: The adrenal glands are unremarkable in appearance.  Moderate right-sided hydronephrosis is noted, with an obstructing 7 x 6 mm stone at the proximal right ureter, just below the right renal pelvis. Right-sided perinephric stranding and fluid are noted.  Scattered nonobstructing bilateral renal stones are seen, larger on the right. Prominent left renal parapelvic cysts are noted.  Stomach/Bowel: The stomach is unremarkable in appearance. The small bowel is within normal limits. The appendix is normal in caliber, without evidence of appendicitis. The colon is unremarkable in appearance.  Vascular/Lymphatic: Scattered calcification is seen along the abdominal aorta and its branches. A retroaortic left renal vein is noted. The abdominal aorta is otherwise grossly unremarkable. The inferior vena cava is grossly unremarkable. No  retroperitoneal lymphadenopathy is seen.  No pelvic sidewall lymphadenopathy is identified.  Reproductive: The stomach is unremarkable in appearance. The small bowel is within normal limits. The patient is status post appendectomy. The colon is unremarkable in appearance.  Other: No additional soft tissue abnormalities are seen.  Musculoskeletal: No acute osseous abnormalities are identified. Mild vacuum phenomenon is noted at L5-S1. The visualized musculature is unremarkable in appearance.  IMPRESSION: 1. Moderate right-sided hydronephrosis, with an obstructing large 7 x 6 mm stone at the proximal right ureter, just below the right renal pelvis. 2. Nonobstructing bilateral renal stones, larger on the right. 3. Prominent left renal parapelvic cysts noted. 4. Scattered aortic atherosclerosis. 5. Mild bibasilar atelectasis noted. Calcification at the mitral valve. 6. Diffuse fatty infiltration within the liver.   Electronically Signed By: Garald Balding M.D. On: 04/23/2017 05:12   Assessment & Plan:    1. Nephrolithiasis -rtc 1 year with renalUS - Urinalysis, Routine w reflex microscopic  2. Dorsalgia -flexeril prn   No follow-ups on file.  Nicolette Bang, MD  [facility] [place]

## 2022-03-29 ENCOUNTER — Encounter: Payer: Self-pay | Admitting: Urology

## 2022-03-29 NOTE — Patient Instructions (Signed)
Dietary Guidelines to Help Prevent Kidney Stones Kidney stones are deposits of minerals and salts that form inside your kidneys. Your risk of developing kidney stones may be greater depending on your diet, your lifestyle, the medicines you take, and whether you have certain medical conditions. Most people can lower their chances of developing kidney stones by following the instructions below. Your dietitian may give you more specific instructions depending on your overall health and the type of kidney stones you tend to develop. What are tips for following this plan? Reading food labels  Choose foods with "no salt added" or "low-salt" labels. Limit your salt (sodium) intake to less than 1,500 mg a day. Choose foods with calcium for each meal and snack. Try to eat about 300 mg of calcium at each meal. Foods that contain 200-500 mg of calcium a serving include: 8 oz (237 mL) of milk, calcium-fortifiednon-dairy milk, and calcium-fortifiedfruit juice. Calcium-fortified means that calcium has been added to these drinks. 8 oz (237 mL) of kefir, yogurt, and soy yogurt. 4 oz (114 g) of tofu. 1 oz (28 g) of cheese. 1 cup (150 g) of dried figs. 1 cup (91 g) of cooked broccoli. One 3 oz (85 g) can of sardines or mackerel. Most people need 1,000-1,500 mg of calcium a day. Talk to your dietitian about how much calcium is recommended for you. Shopping Buy plenty of fresh fruits and vegetables. Most people do not need to avoid fruits and vegetables, even if these foods contain nutrients that may contribute to kidney stones. When shopping for convenience foods, choose: Whole pieces of fruit. Pre-made salads with dressing on the side. Low-fat fruit and yogurt smoothies. Avoid buying frozen meals or prepared deli foods. These can be high in sodium. Look for foods with live cultures, such as yogurt and kefir. Choose high-fiber grains, such as whole-wheat breads, oat bran, and wheat cereals. Cooking Do not add  salt to food when cooking. Place a salt shaker on the table and allow each person to add his or her own salt to taste. Use vegetable protein, such as beans, textured vegetable protein (TVP), or tofu, instead of meat in pasta, casseroles, and soups. Meal planning Eat less salt, if told by your dietitian. To do this: Avoid eating processed or pre-made food. Avoid eating fast food. Eat less animal protein, including cheese, meat, poultry, or fish, if told by your dietitian. To do this: Limit the number of times you have meat, poultry, fish, or cheese each week. Eat a diet free of meat at least 2 days a week. Eat only one serving each day of meat, poultry, fish, or seafood. When you prepare animal protein, cut pieces into small portion sizes. For most meat and fish, one serving is about the size of the palm of your hand. Eat at least five servings of fresh fruits and vegetables each day. To do this: Keep fruits and vegetables on hand for snacks. Eat one piece of fruit or a handful of berries with breakfast. Have a salad and fruit at lunch. Have two kinds of vegetables at dinner. Limit foods that are high in a substance called oxalate. These include: Spinach (cooked), rhubarb, beets, sweet potatoes, and Swiss chard. Peanuts. Potato chips, french fries, and baked potatoes with skin on. Nuts and nut products. Chocolate. If you regularly take a diuretic medicine, make sure to eat at least 1 or 2 servings of fruits or vegetables that are high in potassium each day. These include: Avocado. Banana. Orange, prune,   carrot, or tomato juice. Baked potato. Cabbage. Beans and split peas. Lifestyle  Drink enough fluid to keep your urine pale yellow. This is the most important thing you can do. Spread your fluid intake throughout the day. If you drink alcohol: Limit how much you use to: 0-1 drink a day for women who are not pregnant. 0-2 drinks a day for men. Be aware of how much alcohol is in your  drink. In the [place] one drink equals one 12 oz bottle of beer (355 mL), one 5 oz glass of wine (148 mL), or one 1 oz glass of hard liquor (44 mL). Lose weight if told by your health care provider. Work with your dietitian to find an eating plan and weight loss strategies that work best for you. General information Talk to your health care provider and dietitian about taking daily supplements. You may be told the following depending on your health and the cause of your kidney stones: Not to take supplements with vitamin C. To take a calcium supplement. To take a daily probiotic supplement. To take other supplements such as magnesium, fish oil, or vitamin B6. Take over-the-counter and prescription medicines only as told by your health care provider. These include supplements. What foods should I limit? Limit your intake of the following foods, or eat them as told by your dietitian. Vegetables Spinach. Rhubarb. Beets. Canned vegetables. Pickles. Olives. Baked potatoes with skin. Grains Wheat bran. Baked goods. Salted crackers. Cereals high in sugar. Meats and other proteins Nuts. Nut butters. Large portions of meat, poultry, or fish. Salted, precooked, or cured meats, such as sausages, meat loaves, and hot dogs. Dairy Cheese. Beverages Regular soft drinks. Regular vegetable juice. Seasonings and condiments Seasoning blends with salt. Salad dressings. Soy sauce. Ketchup. Barbecue sauce. Other foods Canned soups. Canned pasta sauce. Casseroles. Pizza. Lasagna. Frozen meals. Potato chips. French fries. The items listed above may not be a complete list of foods and beverages you should limit. Contact a dietitian for more information. What foods should I avoid? Talk to your dietitian about specific foods you should avoid based on the type of kidney stones you have and your overall health. Fruits Grapefruit. The item listed above may not be a complete list of foods and beverages you should  avoid. Contact a dietitian for more information. Summary Kidney stones are deposits of minerals and salts that form inside your kidneys. You can lower your risk of kidney stones by making changes to your diet. The most important thing you can do is drink enough fluid. Drink enough fluid to keep your urine pale yellow. Talk to your dietitian about how much calcium you should have each day, and eat less salt and animal protein as told by your dietitian. This information is not intended to replace advice given to you by your health care provider. Make sure you discuss any questions you have with your health care provider. Document Revised: 07/10/2021 Document Reviewed: 07/10/2021 Elsevier Patient Education  2023 [employer]  

## 2022-04-05 ENCOUNTER — Other Ambulatory Visit: Payer: Self-pay | Admitting: Family Medicine

## 2022-04-05 DIAGNOSIS — I10 Essential (primary) hypertension: Secondary | ICD-10-CM

## 2022-07-04 ENCOUNTER — Other Ambulatory Visit: Payer: Self-pay | Admitting: Family Medicine

## 2022-07-04 DIAGNOSIS — I10 Essential (primary) hypertension: Secondary | ICD-10-CM

## 2022-07-04 NOTE — Telephone Encounter (Signed)
Patient scheduled.

## 2022-07-04 NOTE — Telephone Encounter (Signed)
Patient needs a CPE scheduled for October. Please call patient to set up appt.

## 2022-08-05 ENCOUNTER — Other Ambulatory Visit: Payer: Self-pay | Admitting: Family Medicine

## 2022-08-05 DIAGNOSIS — E785 Hyperlipidemia, unspecified: Secondary | ICD-10-CM

## 2022-08-05 DIAGNOSIS — E559 Vitamin D deficiency, unspecified: Secondary | ICD-10-CM

## 2022-08-05 DIAGNOSIS — E538 Deficiency of other specified B group vitamins: Secondary | ICD-10-CM

## 2022-08-05 DIAGNOSIS — E039 Hypothyroidism, unspecified: Secondary | ICD-10-CM

## 2022-08-10 ENCOUNTER — Other Ambulatory Visit (INDEPENDENT_AMBULATORY_CARE_PROVIDER_SITE_OTHER): Payer: Medicare Other

## 2022-08-10 DIAGNOSIS — E538 Deficiency of other specified B group vitamins: Secondary | ICD-10-CM

## 2022-08-10 DIAGNOSIS — E785 Hyperlipidemia, unspecified: Secondary | ICD-10-CM

## 2022-08-10 DIAGNOSIS — E039 Hypothyroidism, unspecified: Secondary | ICD-10-CM

## 2022-08-10 DIAGNOSIS — E559 Vitamin D deficiency, unspecified: Secondary | ICD-10-CM

## 2022-08-10 NOTE — Addendum Note (Signed)
Addended by: Ellamae Sia on: 08/10/2022 07:43 AM   Modules accepted: Orders

## 2022-08-11 LAB — CBC WITH DIFFERENTIAL/PLATELET
Basophils Absolute: 0.1 10*3/uL (ref 0.0–0.2)
Basos: 1 %
EOS (ABSOLUTE): 0.3 10*3/uL (ref 0.0–0.4)
Eos: 4 %
Hematocrit: 41.8 % (ref 34.0–46.6)
Hemoglobin: 14.1 g/dL (ref 11.1–15.9)
Immature Grans (Abs): 0 10*3/uL (ref 0.0–0.1)
Immature Granulocytes: 0 %
Lymphocytes Absolute: 2.4 10*3/uL (ref 0.7–3.1)
Lymphs: 33 %
MCH: 29.6 pg (ref 26.6–33.0)
MCHC: 33.7 g/dL (ref 31.5–35.7)
MCV: 88 fL (ref 79–97)
Monocytes Absolute: 0.5 10*3/uL (ref 0.1–0.9)
Monocytes: 7 %
Neutrophils Absolute: 4.1 10*3/uL (ref 1.4–7.0)
Neutrophils: 55 %
Platelets: 319 10*3/uL (ref 150–450)
RBC: 4.76 x10E6/uL (ref 3.77–5.28)
RDW: 12.6 % (ref 11.7–15.4)
WBC: 7.3 10*3/uL (ref 3.4–10.8)

## 2022-08-11 LAB — COMPREHENSIVE METABOLIC PANEL
ALT: 22 IU/L (ref 0–32)
AST: 21 IU/L (ref 0–40)
Albumin/Globulin Ratio: 1.8 (ref 1.2–2.2)
Albumin: 4.2 g/dL (ref 3.9–4.9)
Alkaline Phosphatase: 97 IU/L (ref 44–121)
BUN/Creatinine Ratio: 10 — ABNORMAL LOW (ref 12–28)
BUN: 8 mg/dL (ref 8–27)
Bilirubin Total: 0.4 mg/dL (ref 0.0–1.2)
CO2: 25 mmol/L (ref 20–29)
Calcium: 9.2 mg/dL (ref 8.7–10.3)
Chloride: 104 mmol/L (ref 96–106)
Creatinine, Ser: 0.78 mg/dL (ref 0.57–1.00)
Globulin, Total: 2.3 g/dL (ref 1.5–4.5)
Glucose: 118 mg/dL — ABNORMAL HIGH (ref 70–99)
Potassium: 4.3 mmol/L (ref 3.5–5.2)
Sodium: 143 mmol/L (ref 134–144)
Total Protein: 6.5 g/dL (ref 6.0–8.5)
eGFR: 84 mL/min/{1.73_m2} (ref >59)

## 2022-08-11 LAB — LIPID PANEL
Chol/HDL Ratio: 6.2 ratio — ABNORMAL HIGH (ref 0.0–4.4)
Cholesterol, Total: 181 mg/dL (ref 100–199)
HDL: 29 mg/dL — ABNORMAL LOW (ref >39)
LDL Chol Calc (NIH): 119 mg/dL — ABNORMAL HIGH (ref 0–99)
Triglycerides: 185 mg/dL — ABNORMAL HIGH (ref 0–149)
VLDL Cholesterol Cal: 33 mg/dL (ref 5–40)

## 2022-08-11 LAB — VITAMIN D 25 HYDROXY (VIT D DEFICIENCY, FRACTURES): Vit D, 25-Hydroxy: 23.7 ng/mL — ABNORMAL LOW (ref 30.0–100.0)

## 2022-08-11 LAB — VITAMIN B12: Vitamin B-12: 212 pg/mL — ABNORMAL LOW (ref 232–1245)

## 2022-08-11 LAB — TSH: TSH: 2.09 u[IU]/mL (ref 0.450–4.500)

## 2022-08-17 ENCOUNTER — Encounter: Payer: Self-pay | Admitting: Family Medicine

## 2022-08-17 ENCOUNTER — Ambulatory Visit (INDEPENDENT_AMBULATORY_CARE_PROVIDER_SITE_OTHER): Payer: Medicare Other | Admitting: Family Medicine

## 2022-08-17 VITALS — BP 130/74 | HR 66 | Temp 97.1°F | Ht 64.75 in | Wt 203.0 lb

## 2022-08-17 DIAGNOSIS — E538 Deficiency of other specified B group vitamins: Secondary | ICD-10-CM

## 2022-08-17 DIAGNOSIS — Z0000 Encounter for general adult medical examination without abnormal findings: Secondary | ICD-10-CM

## 2022-08-17 DIAGNOSIS — Z889 Allergy status to unspecified drugs, medicaments and biological substances status: Secondary | ICD-10-CM | POA: Diagnosis not present

## 2022-08-17 DIAGNOSIS — Z7189 Other specified counseling: Secondary | ICD-10-CM

## 2022-08-17 DIAGNOSIS — H6990 Unspecified Eustachian tube disorder, unspecified ear: Secondary | ICD-10-CM

## 2022-08-17 DIAGNOSIS — E559 Vitamin D deficiency, unspecified: Secondary | ICD-10-CM | POA: Diagnosis not present

## 2022-08-17 DIAGNOSIS — E039 Hypothyroidism, unspecified: Secondary | ICD-10-CM

## 2022-08-17 DIAGNOSIS — R21 Rash and other nonspecific skin eruption: Secondary | ICD-10-CM

## 2022-08-17 MED ORDER — VITAMIN D (ERGOCALCIFEROL) 1.25 MG (50000 UNIT) PO CAPS: 50000.0000 [IU] | ORAL_CAPSULE | ORAL | 0 refills | Status: DC

## 2022-08-17 MED ORDER — B-12 5000 MCG PO CAPS: 5000.0000 ug | ORAL_CAPSULE | Freq: Every day | ORAL | Status: AC

## 2022-08-17 MED ORDER — TRIAMCINOLONE ACETONIDE 0.1 % EX CREA: 1.0000 | TOPICAL_CREAM | Freq: Two times a day (BID) | CUTANEOUS | 1 refills | Status: DC | PRN

## 2022-08-17 MED ORDER — LEVOTHYROXINE SODIUM 125 MCG PO TABS: 125.0000 ug | ORAL_TABLET | Freq: Every day | ORAL | 12 refills | Status: DC

## 2022-08-17 MED ORDER — FLUTICASONE PROPIONATE 50 MCG/ACT NA SUSP: 2.0000 | Freq: Every day | NASAL | 6 refills | Status: AC

## 2022-08-17 MED ORDER — VITAMIN D3 125 MCG (5000 UT) PO CAPS: 5000.0000 [IU] | ORAL_CAPSULE | Freq: Every day | ORAL | Status: DC

## 2022-08-17 NOTE — Patient Instructions (Addendum)
Recheck labs in 3 months after increased dose of B12 and vit D.   Try TAC cream for the rash.  Flonase and gently try to pop your ears.   Let me know if not better.   Take care.  Glad to see you.

## 2022-08-17 NOTE — Progress Notes (Signed)
I have personally reviewed the Medicare Annual Wellness questionnaire and have noted 1. The patient's medical and social history 2. Their use of alcohol, tobacco or illicit drugs 3. Their current medications and supplements 4. The patient's functional ability including ADL's, fall risks, home safety risks and hearing or visual             impairment. 5. Diet and physical activities 6. Evidence for depression or mood disorders  The patients weight, height, BMI have been recorded in the chart and visual acuity is per eye clinic.  I have made referrals, counseling and provided education to the patient based review of the above and I have provided the pt with a written personalized care plan for preventive services.  Provider list updated- see scanned forms.  Routine anticipatory guidance given to patient.  See health maintenance. The possibility exists that previously documented standard health maintenance information may have been brought forward from a previous encounter into this note.  If needed, that same information has been updated to reflect the current situation based on today's encounter.    Flu encouraged, declined. Shingles encouraged, declined. PNA encouraged, declined. Tetanus can be done at pharmacy. COVID-vaccine encouraged, discussed with patient. Colon  Breast cancer screening per gynecology. Bone density test per gynecology. Advance directive-husband designated if patient were incapacitated. Cognitive function addressed- see scanned forms- and if abnormal then additional documentation follows.   In addition to [facility], follow up visit for the below conditions:  B12 and vit D low.  She recently increased her vit D to 5000 per day.  We talked about tx and recheck.    Advised patient about allergy clinic eval given multiple medicine allergies.  Hypothyroidism.  TSH wnl.  Discussed.  Rash, midline chest.  Itchy.  See exam.  She is taking atenolol prn for  migraines.    In PT re: R knee.  She isn't at the point of having L leg surgery yet.  She will update me as needed.  Hearing improved with valsalva.  D/w pt about trying flonase.  Discussed presumed eustachian tube dysfunction.  PMH and SH reviewed  Meds, vitals, and allergies reviewed.   ROS: Per HPI.  Unless specifically indicated otherwise in HPI, the patient denies:  General: fever. Eyes: acute vision changes ENT: sore throat Cardiovascular: chest pain Respiratory: SOB GI: vomiting GU: dysuria Musculoskeletal: acute back pain Derm: acute rash Neuro: acute motor dysfunction Psych: worsening mood Endocrine: polydipsia Heme: bleeding Allergy: hayfever  GEN: nad, alert and oriented HEENT: mucous membranes moist NECK: supple w/o LA except from small LN on the R side of the neck, not ttp CV: rrr. PULM: ctab, no inc wob ABD: soft, +bs EXT: no edema SKIN: blanching rash on the upper chest wall and irritation at the B fingernail beds.   Walking with a cane and R knee in brace.

## 2022-08-17 NOTE — Progress Notes (Signed)
Hearing Screening - Comments:: Did not pass whisper test Vision Screening - Comments:: October 2022. Appt 08-25-22

## 2022-08-19 DIAGNOSIS — E538 Deficiency of other specified B group vitamins: Secondary | ICD-10-CM | POA: Insufficient documentation

## 2022-08-19 DIAGNOSIS — H6990 Unspecified Eustachian tube disorder, unspecified ear: Secondary | ICD-10-CM | POA: Insufficient documentation

## 2022-08-19 DIAGNOSIS — R21 Rash and other nonspecific skin eruption: Secondary | ICD-10-CM | POA: Insufficient documentation

## 2022-08-19 NOTE — Assessment & Plan Note (Signed)
TSH normal.  Would continue as is.

## 2022-08-19 NOTE — Assessment & Plan Note (Signed)
She declined IM injection replacement.  She wants to try increased dose of oral replacement and then recheck.    Discussed.

## 2022-08-19 NOTE — Assessment & Plan Note (Signed)
Benign-appearing superficial irritation and she can use triamcinolone and update me as needed.
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
## 2022-08-19 NOTE — Assessment & Plan Note (Signed)
Increased weekly replacement and then recheck.  See orders.

## 2022-08-19 NOTE — Assessment & Plan Note (Signed)
  Advised patient about allergy clinic eval given multiple medicine allergies.  She can update me if she wants to go through with that.

## 2022-08-19 NOTE — Assessment & Plan Note (Signed)
Flu encouraged, declined. Shingles encouraged, declined. PNA encouraged, declined. Tetanus can be done at pharmacy. COVID-vaccine encouraged, discussed with patient. Colon  Breast cancer screening per gynecology. Bone density test per gynecology. Advance directive-husband designated if patient were incapacitated. Cognitive function addressed- see scanned forms- and if abnormal then additional documentation follows.

## 2022-08-19 NOTE — Assessment & Plan Note (Signed)
Discussed using Flonase and gently perform Valsalva.

## 2022-08-21 ENCOUNTER — Encounter (HOSPITAL_COMMUNITY): Payer: Self-pay

## 2022-08-21 ENCOUNTER — Emergency Department (HOSPITAL_COMMUNITY)
Admission: EM | Admit: 2022-08-21 | Discharge: 2022-08-21 | Disposition: A | Discharge status: Home / Self Care | Payer: No Typology Code available for payment source | Attending: Emergency Medicine | Admitting: Emergency Medicine

## 2022-08-21 ENCOUNTER — Emergency Department (HOSPITAL_COMMUNITY): Payer: No Typology Code available for payment source

## 2022-08-21 DIAGNOSIS — R0789 Other chest pain: Secondary | ICD-10-CM | POA: Diagnosis not present

## 2022-08-21 DIAGNOSIS — M545 Low back pain, unspecified: Secondary | ICD-10-CM | POA: Insufficient documentation

## 2022-08-21 DIAGNOSIS — Z91040 Latex allergy status: Secondary | ICD-10-CM | POA: Diagnosis not present

## 2022-08-21 DIAGNOSIS — Z79899 Other long term (current) drug therapy: Secondary | ICD-10-CM | POA: Insufficient documentation

## 2022-08-21 DIAGNOSIS — Y92410 Unspecified street and highway as the place of occurrence of the external cause: Secondary | ICD-10-CM | POA: Diagnosis not present

## 2022-08-21 DIAGNOSIS — M79621 Pain in right upper arm: Secondary | ICD-10-CM | POA: Diagnosis not present

## 2022-08-21 DIAGNOSIS — M542 Cervicalgia: Secondary | ICD-10-CM | POA: Diagnosis present

## 2022-08-21 DIAGNOSIS — E039 Hypothyroidism, unspecified: Secondary | ICD-10-CM | POA: Insufficient documentation
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
## 2022-08-21 NOTE — Discharge Instructions (Addendum)
Note the work-up today was overall reassuring.  Your imaging studies were negative for any acute abnormalities.  Recommend close follow-up with PCP in 2 to 3 days for reevaluation of your symptoms.  You can take Tylenol/ibuprofen as needed for pain.  Please do not hesitate to return to emergency department the worrisome signs and symptoms we discussed become apparent.

## 2022-08-30 ENCOUNTER — Encounter: Payer: Self-pay | Admitting: Family Medicine

## 2022-08-30 ENCOUNTER — Ambulatory Visit (INDEPENDENT_AMBULATORY_CARE_PROVIDER_SITE_OTHER): Payer: Medicare Other | Admitting: Family Medicine

## 2022-08-30 DIAGNOSIS — M25519 Pain in unspecified shoulder: Secondary | ICD-10-CM

## 2022-08-30 NOTE — Patient Instructions (Signed)
If your shoulder and neck pain aren't getting better then let me know.  PT may be a reasonable option.  Take care.  Glad to see you.
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
## 2022-09-02 DIAGNOSIS — M25519 Pain in unspecified shoulder: Secondary | ICD-10-CM | POA: Insufficient documentation

## 2022-09-02 NOTE — Assessment & Plan Note (Signed)
Discussed pendulum swings in the meantime.  She is improving in the meantime.  If she continues to have symptoms then we may need to either image her shoulder and/or refer her to PT.  Okay for outpatient follow-up.  She will update me as needed.

## 2022-09-03 ENCOUNTER — Other Ambulatory Visit: Payer: Self-pay | Admitting: Family Medicine

## 2022-09-20 ENCOUNTER — Ambulatory Visit (INDEPENDENT_AMBULATORY_CARE_PROVIDER_SITE_OTHER): Payer: Medicare Other | Admitting: Family Medicine

## 2022-09-20 ENCOUNTER — Encounter: Payer: Self-pay | Admitting: Family Medicine

## 2022-09-20 VITALS — BP 128/70 | HR 77 | Temp 97.6°F | Ht 64.75 in | Wt 201.0 lb

## 2022-09-20 DIAGNOSIS — M25519 Pain in unspecified shoulder: Secondary | ICD-10-CM | POA: Diagnosis not present

## 2022-09-20 DIAGNOSIS — R238 Other skin changes: Secondary | ICD-10-CM | POA: Diagnosis not present

## 2022-09-20 MED ORDER — TRIAMCINOLONE ACETONIDE 0.5 % EX CREA: 1.0000 | TOPICAL_CREAM | Freq: Two times a day (BID) | CUTANEOUS | 1 refills | Status: AC | PRN
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
## 2022-09-20 NOTE — Patient Instructions (Addendum)
Try TAC 0.5% cream on your hands and let me know if that isn't helping.   Take care.  Glad to see you. Let me know if you have trouble getting an appointment with ortho.

## 2022-09-23 DIAGNOSIS — R238 Other skin changes: Secondary | ICD-10-CM | POA: Insufficient documentation

## 2022-09-23 NOTE — Assessment & Plan Note (Signed)
Can try triamcinolone cream 0.5% and let me know if does not help him.

## 2022-09-23 NOTE — Assessment & Plan Note (Signed)
I asked her to follow-up with orthopedics.  See after visit summary.

## 2022-09-28 ENCOUNTER — Other Ambulatory Visit: Payer: Self-pay | Admitting: Family Medicine

## 2022-09-28 DIAGNOSIS — I10 Essential (primary) hypertension: Secondary | ICD-10-CM

## 2022-11-19 ENCOUNTER — Other Ambulatory Visit (INDEPENDENT_AMBULATORY_CARE_PROVIDER_SITE_OTHER): Payer: Medicare Other

## 2022-11-19 DIAGNOSIS — E538 Deficiency of other specified B group vitamins: Secondary | ICD-10-CM

## 2022-11-19 DIAGNOSIS — E559 Vitamin D deficiency, unspecified: Secondary | ICD-10-CM | POA: Diagnosis not present

## 2022-11-19 NOTE — Addendum Note (Signed)
Addended by: Ellamae Sia on: 11/19/2022 07:39 AM   Modules accepted: Orders

## 2022-11-20 LAB — VITAMIN D 25 HYDROXY (VIT D DEFICIENCY, FRACTURES): Vit D, 25-Hydroxy: 32.4 ng/mL (ref 30.0–100.0)

## 2022-11-20 LAB — VITAMIN B12: Vitamin B-12: 713 pg/mL (ref 232–1245)

## 2022-11-22 ENCOUNTER — Other Ambulatory Visit: Payer: Self-pay | Admitting: Family Medicine

## 2022-11-22 MED ORDER — VITAMIN D3 50 MCG (2000 UT) PO CAPS: 2000.0000 [IU] | ORAL_CAPSULE | Freq: Every day | ORAL | Status: DC

## 2023-03-13 ENCOUNTER — Telehealth: Payer: Self-pay

## 2023-03-13 DIAGNOSIS — N200 Calculus of kidney: Secondary | ICD-10-CM

## 2023-03-13 NOTE — Telephone Encounter (Signed)
Patient is aware that renal ultrasound order is in and she can scheduled her ultrasound. Patient voiced understanding

## 2023-03-18 ENCOUNTER — Ambulatory Visit (HOSPITAL_COMMUNITY): Payer: No Typology Code available for payment source

## 2023-03-21 ENCOUNTER — Other Ambulatory Visit (HOSPITAL_COMMUNITY): Payer: Medicare Other

## 2023-03-22 ENCOUNTER — Ambulatory Visit: Payer: Medicare Other | Admitting: Urology

## 2023-05-03 ENCOUNTER — Ambulatory Visit (HOSPITAL_COMMUNITY)
Admission: RE | Admit: 2023-05-03 | Discharge: 2023-05-03 | Disposition: A | Discharge status: Home / Self Care | Payer: Medicare Other | Source: Ambulatory Visit | Attending: Urology | Admitting: Urology

## 2023-05-03 DIAGNOSIS — N200 Calculus of kidney: Secondary | ICD-10-CM

## 2023-05-10 ENCOUNTER — Ambulatory Visit (INDEPENDENT_AMBULATORY_CARE_PROVIDER_SITE_OTHER): Payer: Medicare Other | Admitting: Urology

## 2023-05-10 VITALS — BP 174/76 | HR 80

## 2023-05-10 DIAGNOSIS — Z87442 Personal history of urinary calculi: Secondary | ICD-10-CM | POA: Diagnosis not present

## 2023-05-10 DIAGNOSIS — M549 Dorsalgia, unspecified: Secondary | ICD-10-CM

## 2023-05-10 DIAGNOSIS — N200 Calculus of kidney: Secondary | ICD-10-CM

## 2023-05-10 DIAGNOSIS — N3281 Overactive bladder: Secondary | ICD-10-CM | POA: Diagnosis not present

## 2023-05-10 LAB — URINALYSIS, ROUTINE W REFLEX MICROSCOPIC
Bilirubin, UA: NEGATIVE
Glucose, UA: NEGATIVE
Ketones, UA: NEGATIVE
Leukocytes,UA: NEGATIVE
Nitrite, UA: NEGATIVE
Protein,UA: NEGATIVE
Specific Gravity, UA: 1.015 (ref 1.005–1.030)
Urobilinogen, Ur: 0.2 mg/dL (ref 0.2–1.0)
pH, UA: 6 (ref 5.0–7.5)

## 2023-05-10 LAB — MICROSCOPIC EXAMINATION: Bacteria, UA: NONE SEEN

## 2023-05-10 MED ORDER — MIRABEGRON ER 25 MG PO TB24: 25.0000 mg | ORAL_TABLET | Freq: Every day | ORAL | 3 refills | Status: DC

## 2023-05-10 NOTE — Progress Notes (Unsigned)
05/10/2023 1:01 PM   Katelyn [name] 08/19/1956 [id number]  Referring provider: Joaquim Nam, MD [address] [place],  [place] [address]  No chief complaint on file.   HPI: No stone events since last visit. Renal US shows bilateral extra renal pelvises.   PMH: Past Medical History:  Diagnosis Date   Anemia    Arthritis    Asthma    Headache(784.0)    cluster headaches, migraines- states is taking Atenolol at present as directed per Dr Wynelle Cleveland  to prevent migraine from increased stress-  states if on medication for several weeks that heart rate will drop to 40's   History of kidney stones    HYPOTHYROIDISM 05/27/2007   Pneumonia    post op x 2 last yrs ago   PONV (postoperative nausea and vomiting)    likes phenergan with anesthesia, has woken up with 2 surgeries per patient    WEIGHT GAIN 07/15/2009    Surgical History: Past Surgical History:  Procedure Laterality Date   ABDOMINAL HYSTERECTOMY     APPENDECTOMY     CYSTOSCOPY WITH RETROGRADE PYELOGRAM, URETEROSCOPY AND STENT PLACEMENT Right 04/26/2017   Procedure: CYSTOSCOPY WITH RETROGRADE PYELOGRAM, URETEROSCOPY AND STENT PLACEMENT;  Surgeon: Malen Gauze, MD;  Location: [facility];  Service: Urology;  Laterality: Right;   CYSTOSCOPY WITH RETROGRADE PYELOGRAM, URETEROSCOPY AND STENT PLACEMENT Right 08/19/2017   Procedure: CYSTOSCOPY WITH RETROGRADE PYELOGRAM, URETEROSCOPY;  Surgeon: Malen Gauze, MD;  Location: [facility];  Service: Urology;  Laterality: Right;   DIAGNOSTIC LAPAROSCOPY     FOOT SURGERY  2007   tumor removed from right foot/    HOLMIUM LASER APPLICATION Right 04/26/2017   Procedure: HOLMIUM LASER APPLICATION;  Surgeon: Malen Gauze, MD;  Location: [facility];  Service: Urology;  Laterality: Right;   KNEE ARTHROSCOPY Left    x 3   LAPAROTOMY  10/30/2011   Procedure: EXPLORATORY LAPAROTOMY;  Surgeon: Laurette Schimke, MD;  Location: [facility];  Service: Gynecology;   Laterality: N/A;   mortons neuroma removed from foot     SALPINGOOPHORECTOMY  10/30/2011   Procedure: SALPINGO OOPHERECTOMY;  Surgeon: Laurette Schimke, MD;  Location: [facility];  Service: Gynecology;  Laterality: Right;  RIGHT   SCAR DEBRIDEMENT OF TOTAL KNEE Left 11/24/2020   Procedure: Open excision of scar tissue of left total knee arthroplasty with polyethylene revision;  Surgeon: Durene Romans, MD;  Location: [facility];  Service: Orthopedics;  Laterality: Left;  90 mins   TONSILLECTOMY     TOTAL KNEE ARTHROPLASTY Left 09/25/2017   Procedure: LEFT TOTAL KNEE ARTHROPLASTY;  Surgeon: Ranee Gosselin, MD;  Location: [facility];  Service: Orthopedics;  Laterality: Left;   TUBAL LIGATION     TYMPANOSTOMY TUBE PLACEMENT      Home Medications:  Allergies as of 05/10/2023       Reactions   Adhesive [tape] Swelling, Other (See Comments)   Reaction:  Eats in skin   Ciprofloxacin Hives, Shortness Of Breath   Dimethicone Hives, Shortness Of Breath   Erythromycin Hives, Shortness Of Breath   Latex Hives, Shortness Of Breath   Methocarbamol Hives   Other Hives, Shortness Of Breath, Other (See Comments)   Pt is allergic to nitrile gloves.  Pt is allergic to antibacterial soaps- rash - states she only uses Dove soap  SCDs- patient states - had itching with this    Penicillins Hives, Shortness Of Breath, Other (See Comments)   Has patient had a PCN reaction  causing immediate rash, facial/tongue/throat swelling, SOB or lightheadedness with hypotension: Yes Has patient had a PCN reaction causing severe rash involving mucus membranes or skin necrosis: No Has patient had a PCN reaction that required hospitalization No Has patient had a PCN reaction occurring within the last 10 years: No If all of the above answers are "NO", then may proceed with Cephalosporin use.   Pineapple Anaphylaxis   Red Dye Hives, Shortness Of Breath   Shellfish Allergy Hives, Shortness Of Breath   Able to tolerate topical betadine.      Sulfonamide Derivatives Hives, Shortness Of Breath   Alcohol-sulfur [elemental Sulfur] Swelling, Other (See Comments)   Reaction:  Localized swelling  With alcohol   Aspirin Nausea And Vomiting   Atorvastatin    myalgias   Doxycycline Nausea And Vomiting   Hibiclens [chlorhexidine]    Rash    Ceftin [cefuroxime] Rash        Medication List        Accurate as of May 10, 2023  1:01 PM. If you have any questions, ask your nurse or doctor.          albuterol 108 (90 Base) MCG/ACT inhaler Commonly known as: VENTOLIN HFA INHALE 2 PUFFS INTO THE LUNGS EVERY 6 HOURS AS NEEDED FOR WHEEZING OR SHORTNESS OF BREATH.   atenolol 25 MG tablet Commonly known as: TENORMIN TAKE 1 TABLET BY MOUTH EVERY MORNING FORMIGRAINES   B-12 5000 MCG Caps Take 5,000 mcg by mouth daily.   Biotin 1 MG Caps Take 1 mg by mouth daily.   cyclobenzaprine 5 MG tablet Commonly known as: FLEXERIL Take 1 tablet (5 mg total) by mouth 3 (three) times daily as needed for muscle spasms.   fluticasone 50 MCG/ACT nasal spray Commonly known as: FLONASE Place 2 sprays into both nostrils daily.   ipratropium 0.03 % nasal spray Commonly known as: ATROVENT Place 2 sprays into both nostrils every 12 (twelve) hours.   levothyroxine 125 MCG tablet Commonly known as: SYNTHROID Take 1 tablet (125 mcg total) by mouth daily.   mirabegron ER 25 MG Tb24 tablet Commonly known as: MYRBETRIQ Take 1 tab every 4th day   tamsulosin 0.4 MG Caps capsule Commonly known as: FLOMAX Take 1 capsule (0.4 mg total) by mouth daily as needed (kidney stones).   triamcinolone cream 0.5 % Commonly known as: KENALOG Apply 1 Application topically 2 (two) times daily as needed.   Vitamin D3 50 MCG (2000 UT) capsule Take 1 capsule (2,000 Units total) by mouth daily.        Allergies:  Allergies  Allergen Reactions   Adhesive [Tape] Swelling and Other (See Comments)    Reaction:  Eats in skin   Ciprofloxacin Hives and  Shortness Of Breath   Dimethicone Hives and Shortness Of Breath   Erythromycin Hives and Shortness Of Breath   Latex Hives and Shortness Of Breath   Methocarbamol Hives   Other Hives, Shortness Of Breath and Other (See Comments)    Pt is allergic to nitrile gloves.  Pt is allergic to antibacterial soaps- rash - states she only uses Dove soap  SCDs- patient states - had itching with this    Penicillins Hives, Shortness Of Breath and Other (See Comments)    Has patient had a PCN reaction causing immediate rash, facial/tongue/throat swelling, SOB or lightheadedness with hypotension: Yes Has patient had a PCN reaction causing severe rash involving mucus membranes or skin necrosis: No Has patient had a PCN reaction that required hospitalization  No Has patient had a PCN reaction occurring within the last 10 years: No If all of the above answers are "NO", then may proceed with Cephalosporin use.   Pineapple Anaphylaxis   Red Dye Hives and Shortness Of Breath   Shellfish Allergy Hives and Shortness Of Breath    Able to tolerate topical betadine.     Sulfonamide Derivatives Hives and Shortness Of Breath   Alcohol-Sulfur [Elemental Sulfur] Swelling and Other (See Comments)    Reaction:  Localized swelling  With alcohol    Aspirin Nausea And Vomiting   Atorvastatin     myalgias   Doxycycline Nausea And Vomiting   Hibiclens [Chlorhexidine]     Rash    Ceftin [Cefuroxime] Rash    Family History: Family History  Problem Relation Age of Onset   Thyroid disease Mother    Alzheimer's disease Mother    Cervical cancer Mother    Thyroid disease Father    Diabetes Sister    Heart attack Brother    Hypertension Neg Hx        family hx   Heart disease Neg Hx        family hx    Social History:  reports that she has never smoked. She has been exposed to tobacco smoke. She has never used smokeless tobacco. She reports that she does not drink alcohol and does not use drugs.  ROS: All other  review of systems were reviewed and are negative except what is noted above in HPI  Physical Exam: BP (!) 174/76   Pulse 80   Constitutional:  Alert and oriented, No acute distress. HEENT: [place] AT, moist mucus membranes.  Trachea midline, no masses. Cardiovascular: No clubbing, cyanosis, or edema. Respiratory: Normal respiratory effort, no increased work of breathing. GI: Abdomen is soft, nontender, nondistended, no abdominal masses GU: No CVA tenderness.  Lymph: No cervical or inguinal lymphadenopathy. Skin: No rashes, bruises or suspicious lesions. Neurologic: Grossly intact, no focal deficits, moving all 4 extremities. Psychiatric: Normal mood and affect.  Laboratory Data: Lab Results  Component Value Date   WBC 7.3 08/10/2022   HGB 14.1 08/10/2022   HCT 41.8 08/10/2022   MCV 88 08/10/2022   PLT 319 08/10/2022    Lab Results  Component Value Date   CREATININE 0.78 08/10/2022    No results found for: "PSA"  No results found for: "TESTOSTERONE"  Lab Results  Component Value Date   HGBA1C 5.6 11/02/2020    Urinalysis    Component Value Date/Time   COLORURINE YELLOW 09/28/2017 0126   APPEARANCEUR Clear 03/23/2022 1301   LABSPEC 1.010 09/28/2017 0126   PHURINE 6.0 09/28/2017 0126   GLUCOSEU Negative 03/23/2022 1301   HGBUR MODERATE (A) 09/28/2017 0126   BILIRUBINUR Negative 03/23/2022 1301   KETONESUR 20 (A) 09/28/2017 0126   PROTEINUR Negative 03/23/2022 1301   PROTEINUR NEGATIVE 09/28/2017 0126   UROBILINOGEN 0.2 11/02/2020 1434   UROBILINOGEN 0.2 08/30/2011 2014   NITRITE Negative 03/23/2022 1301   NITRITE NEGATIVE 09/28/2017 0126   LEUKOCYTESUR Negative 03/23/2022 1301    Lab Results  Component Value Date   LABMICR See below: 03/23/2022   WBCUA 0-5 03/23/2022   LABEPIT 0-10 03/23/2022   MUCUS Present 03/24/2021   BACTERIA Few (A) 03/23/2022    Pertinent Imaging: *** Results for orders placed during the hospital encounter of 07/22/17  DG Abdomen  1 View  Narrative CLINICAL DATA:  Left flank pain and patient with a known history of urinary tract stones.  EXAM: ABDOMEN - 1 VIEW  COMPARISON:  CT abdomen and pelvis 04/23/2017.  FINDINGS: A subtle 0.5 cm density projecting in the mid pole of the right kidney may be a nonobstructing stone. No other evidence of urinary tract stone is identified. Punctate pelvic phlebolith is noted. The bowel gas pattern is unremarkable. No focal bony abnormality.  IMPRESSION: Possible 0.5 cm nonobstructing stone midpole right kidney. No other evidence of urinary tract stone.   Electronically Signed By: Drusilla Kanner M.D. On: 07/22/2017 02:07  No results found for this or any previous visit.  No results found for this or any previous visit.  No results found for this or any previous visit.  Results for orders placed during the hospital encounter of 05/03/23  US RENAL  Narrative CLINICAL DATA:  Kidney stone follow-up  EXAM: RENAL / URINARY TRACT ULTRASOUND COMPLETE  COMPARISON:  None Available.  FINDINGS: Right Kidney:  Renal measurements: Size in 3 dimensions = volume: 189 mL. Mild hydronephrosis.  Left Kidney:  Renal measurements: 12.1 x 6.3 x 5.9 cm = volume: 234 mL. Mild hydronephrosis.  Bladder:  Appears normal for degree of bladder distention.  Other:  None.  IMPRESSION: Mild bilateral hydronephrosis. No other abnormalities.   Electronically Signed By: Gerome Sam III M.D. On: 05/03/2023 13:31  No valid procedures specified. No results found for this or any previous visit.  Results for orders placed during the hospital encounter of 04/23/17  CT Renal Stone Study  Narrative CLINICAL DATA:  Acute onset of right flank pain. Leukocytosis. Red blood cells and white blood cells in the urine. Initial encounter.  EXAM: CT ABDOMEN AND PELVIS WITHOUT CONTRAST  TECHNIQUE: Multidetector CT imaging of the abdomen and pelvis was performed following  the standard protocol without IV contrast.  COMPARISON:  CT of the abdomen and pelvis performed 01/08/2017, and pelvic ultrasound performed 08/31/2011  FINDINGS: Lower chest: Mild bibasilar atelectasis is noted. Calcification is seen at the mitral valve.  Hepatobiliary: There is diffuse fatty infiltration within the liver. The gallbladder is unremarkable in appearance. The common bile duct remains normal in size.  Pancreas: The pancreas is within normal limits.  Spleen: The spleen is unremarkable in appearance.  Adrenals/Urinary Tract: The adrenal glands are unremarkable in appearance.  Moderate right-sided hydronephrosis is noted, with an obstructing 7 x 6 mm stone at the proximal right ureter, just below the right renal pelvis. Right-sided perinephric stranding and fluid are noted.  Scattered nonobstructing bilateral renal stones are seen, larger on the right. Prominent left renal parapelvic cysts are noted.  Stomach/Bowel: The stomach is unremarkable in appearance. The small bowel is within normal limits. The appendix is normal in caliber, without evidence of appendicitis. The colon is unremarkable in appearance.  Vascular/Lymphatic: Scattered calcification is seen along the abdominal aorta and its branches. A retroaortic left renal vein is noted. The abdominal aorta is otherwise grossly unremarkable. The inferior vena cava is grossly unremarkable. No retroperitoneal lymphadenopathy is seen. No pelvic sidewall lymphadenopathy is identified.  Reproductive: The stomach is unremarkable in appearance. The small bowel is within normal limits. The patient is status post appendectomy. The colon is unremarkable in appearance.  Other: No additional soft tissue abnormalities are seen.  Musculoskeletal: No acute osseous abnormalities are identified. Mild vacuum phenomenon is noted at L5-S1. The visualized musculature is unremarkable in appearance.  IMPRESSION: 1. Moderate  right-sided hydronephrosis, with an obstructing large 7 x 6 mm stone at the proximal right ureter, just below the right renal pelvis. 2. Nonobstructing  bilateral renal stones, larger on the right. 3. Prominent left renal parapelvic cysts noted. 4. Scattered aortic atherosclerosis. 5. Mild bibasilar atelectasis noted. Calcification at the mitral valve. 6. Diffuse fatty infiltration within the liver.   Electronically Signed By: Roanna Raider M.D. On: 04/23/2017 05:12   Assessment & Plan:    1. Nephrolithiasis Followup 1 year with renal US - Urinalysis, Routine w reflex microscopic  2. OAB Mirabegron 25mg  daily   No follow-ups on file.  Wilkie Aye, MD  [facility]

## 2023-05-10 NOTE — Patient Instructions (Signed)

## 2023-05-12 ENCOUNTER — Encounter: Payer: Self-pay | Admitting: Urology

## 2023-09-10 ENCOUNTER — Other Ambulatory Visit: Payer: Self-pay | Admitting: Family Medicine

## 2023-09-10 NOTE — Telephone Encounter (Signed)
Patient due for CPE; please call to schedule appt.

## 2023-09-11 NOTE — Telephone Encounter (Signed)
Lvmtcb, sent mychart message  

## 2023-10-08 ENCOUNTER — Other Ambulatory Visit: Payer: Self-pay | Admitting: Family Medicine

## 2023-10-13 ENCOUNTER — Other Ambulatory Visit: Payer: Self-pay | Admitting: Family Medicine

## 2023-10-13 DIAGNOSIS — E785 Hyperlipidemia, unspecified: Secondary | ICD-10-CM

## 2023-10-13 DIAGNOSIS — E039 Hypothyroidism, unspecified: Secondary | ICD-10-CM

## 2023-10-13 DIAGNOSIS — E538 Deficiency of other specified B group vitamins: Secondary | ICD-10-CM

## 2023-10-13 DIAGNOSIS — E559 Vitamin D deficiency, unspecified: Secondary | ICD-10-CM

## 2023-10-18 ENCOUNTER — Other Ambulatory Visit (INDEPENDENT_AMBULATORY_CARE_PROVIDER_SITE_OTHER): Payer: Medicare Other

## 2023-10-18 DIAGNOSIS — E039 Hypothyroidism, unspecified: Secondary | ICD-10-CM

## 2023-10-18 DIAGNOSIS — E559 Vitamin D deficiency, unspecified: Secondary | ICD-10-CM

## 2023-10-18 DIAGNOSIS — E785 Hyperlipidemia, unspecified: Secondary | ICD-10-CM

## 2023-10-18 DIAGNOSIS — E538 Deficiency of other specified B group vitamins: Secondary | ICD-10-CM

## 2023-10-18 NOTE — Addendum Note (Signed)
Addended by: Lovena Neighbours on: 10/18/2023 08:41 AM   Modules accepted: Orders

## 2023-10-25 ENCOUNTER — Ambulatory Visit (INDEPENDENT_AMBULATORY_CARE_PROVIDER_SITE_OTHER): Payer: Medicare Other | Admitting: Family Medicine

## 2023-10-25 ENCOUNTER — Encounter: Payer: Self-pay | Admitting: Family Medicine

## 2023-10-25 VITALS — BP 134/72 | HR 63 | Temp 98.8°F | Ht 64.57 in | Wt 197.2 lb

## 2023-10-25 DIAGNOSIS — E538 Deficiency of other specified B group vitamins: Secondary | ICD-10-CM | POA: Diagnosis not present

## 2023-10-25 DIAGNOSIS — I10 Essential (primary) hypertension: Secondary | ICD-10-CM

## 2023-10-25 DIAGNOSIS — E039 Hypothyroidism, unspecified: Secondary | ICD-10-CM

## 2023-10-25 DIAGNOSIS — Z0000 Encounter for general adult medical examination without abnormal findings: Secondary | ICD-10-CM

## 2023-10-25 DIAGNOSIS — E063 Autoimmune thyroiditis: Secondary | ICD-10-CM | POA: Diagnosis not present

## 2023-10-25 DIAGNOSIS — R519 Headache, unspecified: Secondary | ICD-10-CM

## 2023-10-25 DIAGNOSIS — Z2882 Immunization not carried out because of caregiver refusal: Secondary | ICD-10-CM

## 2023-10-25 DIAGNOSIS — E559 Vitamin D deficiency, unspecified: Secondary | ICD-10-CM | POA: Diagnosis not present

## 2023-10-25 DIAGNOSIS — N3281 Overactive bladder: Secondary | ICD-10-CM

## 2023-10-25 DIAGNOSIS — Z780 Asymptomatic menopausal state: Secondary | ICD-10-CM

## 2023-10-25 MED ORDER — ATENOLOL 25 MG PO TABS: 25.0000 mg | ORAL_TABLET | Freq: Every day | ORAL | Status: DC | PRN

## 2023-10-25 MED ORDER — TAMSULOSIN HCL 0.4 MG PO CAPS: 0.4000 mg | ORAL_CAPSULE | Freq: Every day | ORAL | 11 refills | Status: AC | PRN

## 2023-10-25 MED ORDER — MIRABEGRON ER 25 MG PO TB24: 25.0000 mg | ORAL_TABLET | ORAL | Status: DC

## 2023-10-25 MED ORDER — SYNTHROID 125 MCG PO TABS: 125.0000 ug | ORAL_TABLET | Freq: Every day | ORAL | 3 refills | Status: DC

## 2023-10-25 MED ORDER — VITAMIN D3 50 MCG (2000 UT) PO CAPS: 4000.0000 [IU] | ORAL_CAPSULE | Freq: Every day | ORAL | Status: AC

## 2023-10-25 MED ORDER — ATENOLOL 25 MG PO TABS: 25.0000 mg | ORAL_TABLET | Freq: Every day | ORAL | 3 refills | Status: AC | PRN

## 2023-10-25 NOTE — Progress Notes (Unsigned)
I have personally reviewed the Medicare Annual Wellness questionnaire and have noted 1. The patient's medical and social history 2. Their use of alcohol, tobacco or illicit drugs 3. Their current medications and supplements 4. The patient's functional ability including ADL's, fall risks, home safety risks and hearing or visual             impairment. 5. Diet and physical activities 6. Evidence for depression or mood disorders  The patients weight, height, BMI have been recorded in the chart and visual acuity is per eye clinic.  I have made referrals, counseling and provided education to the patient based review of the above and I have provided the pt with a written personalized care plan for preventive services.  Provider list updated- see scanned forms.  Routine anticipatory guidance given to patient.  See health maintenance. The possibility exists that previously documented standard health maintenance information may have been brought forward from a previous encounter into this note.  If needed, that same information has been updated to reflect the current situation based on today's encounter.    Flu Shingles PNA Tetanus Colon  Breast cancer screening Prostate cancer screening Advance directive Cognitive function addressed- see scanned forms- and if abnormal then additional documentation follows.   In addition to [facility], follow up visit for the below conditions:  Hypothyroidism.  I don't see prev order for TSH- d/w pt.  Compliant with synthroid.  per day.  She generally feels hot.    No FCNAVD.   Uses SABA in the wintertime.  No ADE on med.  H/o wintertime cough.  Worse sx with dust exposure.    Taking atentolol about once per week.  Used for migraines.    B12 still pending.   Vit d mildly low.  D/w pt about continued use at higher dose and recheck in about 3 months.    PMH and SH reviewed  Meds, vitals, and allergies reviewed.   ROS: Per HPI.  Unless  specifically indicated otherwise in HPI, the patient denies:  General: fever. Eyes: acute vision changes ENT: sore throat Cardiovascular: chest pain Respiratory: SOB GI: vomiting GU: dysuria Musculoskeletal: acute back pain Derm: acute rash Neuro: acute motor dysfunction Psych: worsening mood Endocrine: polydipsia Heme: bleeding Allergy: hayfever  GEN: nad, alert and oriented HEENT: mucous membranes moist NECK: supple w/o LA CV: rrr. PULM: ctab, no inc wob ABD: soft, +bs EXT: no edema SKIN: no acute rash

## 2023-10-25 NOTE — Patient Instructions (Addendum)
Recheck labs in 3 months.  Ask for the labs to go to [facility].  Increase vitamin D in the meantime.  Take care.  Glad to see you.  You can call for a bone density test at the [facility] [address] [place] [address] [place]

## 2023-10-27 DIAGNOSIS — Z2882 Immunization not carried out because of caregiver refusal: Secondary | ICD-10-CM | POA: Insufficient documentation

## 2023-10-27 NOTE — Assessment & Plan Note (Signed)
Flu declined Shingles declined PNA declined Tetanus declined COVID-vaccine declined Colon cancer screening declined Breast cancer screening/bone density screening discussed with patient.  She can call about scheduling her mammogram and bone density Advance directive-husband designated patient were incapacitated. Cognitive function addressed- see scanned forms- and if abnormal then additional documentation follows.   She is aware that by not addressing routine health maintenance items she exposes herself to increased risk of death or disability from those preventable issues.

## 2023-10-27 NOTE — Assessment & Plan Note (Signed)
Continue levothyroxine as is and recheck TSH with next set of labs.

## 2023-10-27 NOTE — Assessment & Plan Note (Signed)
Flu declined Shingles declined PNA declined Tetanus declined COVID-vaccine declined Colon cancer screening declined  She is aware that by not addressing routine health maintenance items she exposes herself to increased risk of death or disability from those preventable issues.

## 2023-10-27 NOTE — Assessment & Plan Note (Signed)
Continue atenolol as needed.

## 2023-10-27 NOTE — Assessment & Plan Note (Signed)
Lab pending at time of office visit.  See above.

## 2023-10-27 NOTE — Assessment & Plan Note (Signed)
Increase vitamin D replacement to 4000 units/day and recheck level in about 3 months.

## 2023-10-31 ENCOUNTER — Telehealth: Payer: Self-pay | Admitting: Family Medicine

## 2023-10-31 DIAGNOSIS — E538 Deficiency of other specified B group vitamins: Secondary | ICD-10-CM

## 2023-10-31 NOTE — Telephone Encounter (Signed)
Please check on her B12 report.  I still do not see it finalized.  Please let me know what you find out.  Thanks.

## 2023-11-01 ENCOUNTER — Other Ambulatory Visit: Payer: Self-pay | Admitting: Family Medicine

## 2023-11-01 LAB — CBC WITH DIFFERENTIAL/PLATELET
Basophils Absolute: 0.1 10*3/uL (ref 0.0–0.2)
Basos: 1 %
EOS (ABSOLUTE): 0.2 10*3/uL (ref 0.0–0.4)
Eos: 2 %
Hematocrit: 44 % (ref 34.0–46.6)
Hemoglobin: 14.1 g/dL (ref 11.1–15.9)
Immature Grans (Abs): 0 10*3/uL (ref 0.0–0.1)
Immature Granulocytes: 1 %
Lymphocytes Absolute: 2.8 10*3/uL (ref 0.7–3.1)
Lymphs: 32 %
MCH: 28.5 pg (ref 26.6–33.0)
MCHC: 32 g/dL (ref 31.5–35.7)
MCV: 89 fL (ref 79–97)
Monocytes Absolute: 0.6 10*3/uL (ref 0.1–0.9)
Monocytes: 7 %
Neutrophils Absolute: 5 10*3/uL (ref 1.4–7.0)
Neutrophils: 57 %
Platelets: 356 10*3/uL (ref 150–450)
RBC: 4.95 x10E6/uL (ref 3.77–5.28)
RDW: 12.4 % (ref 11.7–15.4)
WBC: 8.6 10*3/uL (ref 3.4–10.8)

## 2023-11-01 LAB — COMPREHENSIVE METABOLIC PANEL
ALT: 28 [IU]/L (ref 0–32)
AST: 34 [IU]/L (ref 0–40)
Albumin: 4.2 g/dL (ref 3.9–4.9)
Alkaline Phosphatase: 98 [IU]/L (ref 44–121)
BUN/Creatinine Ratio: 12 (ref 12–28)
BUN: 12 mg/dL (ref 8–27)
Bilirubin Total: 0.3 mg/dL (ref 0.0–1.2)
CO2: 23 mmol/L (ref 20–29)
Calcium: 9.3 mg/dL (ref 8.7–10.3)
Chloride: 103 mmol/L (ref 96–106)
Creatinine, Ser: 1 mg/dL (ref 0.57–1.00)
Globulin, Total: 2.6 g/dL (ref 1.5–4.5)
Glucose: 97 mg/dL (ref 70–99)
Potassium: 4.7 mmol/L (ref 3.5–5.2)
Sodium: 141 mmol/L (ref 134–144)
Total Protein: 6.8 g/dL (ref 6.0–8.5)
eGFR: 62 mL/min/{1.73_m2} (ref >59)

## 2023-11-01 LAB — LIPID PANEL
Chol/HDL Ratio: 5.8 {ratio} — ABNORMAL HIGH (ref 0.0–4.4)
Cholesterol, Total: 208 mg/dL — ABNORMAL HIGH (ref 100–199)
HDL: 36 mg/dL — ABNORMAL LOW (ref >39)
LDL Chol Calc (NIH): 134 mg/dL — ABNORMAL HIGH (ref 0–99)
Triglycerides: 213 mg/dL — ABNORMAL HIGH (ref 0–149)
VLDL Cholesterol Cal: 38 mg/dL (ref 5–40)

## 2023-11-01 LAB — VITAMIN D 25 HYDROXY (VIT D DEFICIENCY, FRACTURES): Vit D, 25-Hydroxy: 29.3 ng/mL — ABNORMAL LOW (ref 30.0–100.0)

## 2023-11-01 LAB — VITAMIN B12

## 2023-11-03 NOTE — Addendum Note (Signed)
Addended by: Joaquim Nam on: 11/03/2023 11:38 AM   Modules accepted: Orders

## 2023-11-03 NOTE — Telephone Encounter (Signed)
Katelyn [name]  You2 days ago    Second call to [facility] about this, they will contact me to let me know what the issue is. I have contact numbers if I need to call back  ======================== Please call patient.  The order was in the EMR for B12 level but it is now listed as canceled.  I do not expect to result from [facility] at this point.  I put in an order for B12 to be collected with her follow-up vitamin D and TSH.  If she wants to go ahead and get the B12 collected separately in the meantime then please proceed.  She is not due for the vitamin D and TSH until later.

## 2024-01-23 ENCOUNTER — Other Ambulatory Visit (INDEPENDENT_AMBULATORY_CARE_PROVIDER_SITE_OTHER): Payer: Medicare Other

## 2024-01-23 DIAGNOSIS — E559 Vitamin D deficiency, unspecified: Secondary | ICD-10-CM

## 2024-01-23 DIAGNOSIS — E538 Deficiency of other specified B group vitamins: Secondary | ICD-10-CM | POA: Diagnosis not present

## 2024-01-23 DIAGNOSIS — E039 Hypothyroidism, unspecified: Secondary | ICD-10-CM

## 2024-01-23 NOTE — Addendum Note (Signed)
 Addended by: Lovena Neighbours on: 01/23/2024 08:09 AM   Modules accepted: Orders

## 2024-01-23 NOTE — Addendum Note (Signed)
 Addended by: Lovena Neighbours on: 01/23/2024 08:06 AM   Modules accepted: Orders

## 2024-01-26 ENCOUNTER — Other Ambulatory Visit: Payer: Self-pay | Admitting: Family Medicine

## 2024-01-26 ENCOUNTER — Encounter: Payer: Self-pay | Admitting: Family Medicine

## 2024-01-26 DIAGNOSIS — E039 Hypothyroidism, unspecified: Secondary | ICD-10-CM

## 2024-01-26 MED ORDER — SYNTHROID 125 MCG PO TABS: 125.0000 ug | ORAL_TABLET | Freq: Every day | ORAL | Status: DC

## 2024-01-28 LAB — TSH: TSH: 8.71 u[IU]/mL — ABNORMAL HIGH (ref 0.450–4.500)

## 2024-01-28 LAB — VITAMIN B12: Vitamin B-12: 643 pg/mL (ref 232–1245)

## 2024-01-28 LAB — VITAMIN D 25 HYDROXY (VIT D DEFICIENCY, FRACTURES): Vit D, 25-Hydroxy: 49.8 ng/mL (ref 30.0–100.0)

## 2024-04-17 ENCOUNTER — Ambulatory Visit (HOSPITAL_COMMUNITY)

## 2024-04-24 ENCOUNTER — Ambulatory Visit (HOSPITAL_COMMUNITY)
Admission: RE | Admit: 2024-04-24 | Discharge: 2024-04-24 | Disposition: A | Discharge status: Home / Self Care | Source: Ambulatory Visit | Attending: Urology | Admitting: Urology

## 2024-04-24 DIAGNOSIS — N200 Calculus of kidney: Secondary | ICD-10-CM | POA: Diagnosis present

## 2024-04-28 ENCOUNTER — Other Ambulatory Visit (INDEPENDENT_AMBULATORY_CARE_PROVIDER_SITE_OTHER)

## 2024-04-28 DIAGNOSIS — E039 Hypothyroidism, unspecified: Secondary | ICD-10-CM | POA: Diagnosis not present

## 2024-04-29 LAB — TSH: TSH: 13 u[IU]/mL — ABNORMAL HIGH (ref 0.450–4.500)

## 2024-04-30 ENCOUNTER — Other Ambulatory Visit: Payer: Self-pay | Admitting: Family Medicine

## 2024-04-30 ENCOUNTER — Ambulatory Visit: Payer: Self-pay | Admitting: Family Medicine

## 2024-04-30 DIAGNOSIS — E039 Hypothyroidism, unspecified: Secondary | ICD-10-CM

## 2024-04-30 MED ORDER — SYNTHROID 125 MCG PO TABS: 125.0000 ug | ORAL_TABLET | Freq: Every day | ORAL | Status: AC

## 2024-05-05 ENCOUNTER — Ambulatory Visit: Payer: Self-pay

## 2024-05-05 NOTE — Addendum Note (Signed)
 Addended byBETHA MALACHY SLICE on: 05/05/2024 10:01 AM   Modules accepted: Orders

## 2024-05-08 ENCOUNTER — Ambulatory Visit (HOSPITAL_COMMUNITY)
Admission: RE | Admit: 2024-05-08 | Discharge: 2024-05-08 | Disposition: A | Discharge status: Home / Self Care | Source: Ambulatory Visit | Attending: Urology | Admitting: Urology

## 2024-05-08 ENCOUNTER — Encounter: Payer: Self-pay | Admitting: Urology

## 2024-05-08 ENCOUNTER — Ambulatory Visit: Payer: Medicare Other | Admitting: Urology

## 2024-05-08 VITALS — BP 135/70 | HR 69

## 2024-05-08 DIAGNOSIS — N3281 Overactive bladder: Secondary | ICD-10-CM | POA: Diagnosis not present

## 2024-05-08 DIAGNOSIS — N1339 Other hydronephrosis: Secondary | ICD-10-CM

## 2024-05-08 DIAGNOSIS — N200 Calculus of kidney: Secondary | ICD-10-CM | POA: Diagnosis present

## 2024-05-08 LAB — URINALYSIS, ROUTINE W REFLEX MICROSCOPIC
Bilirubin, UA: NEGATIVE
Glucose, UA: NEGATIVE
Ketones, UA: NEGATIVE
Nitrite, UA: NEGATIVE
RBC, UA: NEGATIVE
Specific Gravity, UA: 1.02 (ref 1.005–1.030)
Urobilinogen, Ur: 0.2 mg/dL (ref 0.2–1.0)
pH, UA: 6 (ref 5.0–7.5)

## 2024-05-08 LAB — MICROSCOPIC EXAMINATION

## 2024-05-08 MED ORDER — MIRABEGRON ER 25 MG PO TB24: 25.0000 mg | ORAL_TABLET | ORAL | 5 refills | Status: AC

## 2024-05-08 NOTE — Progress Notes (Unsigned)
 05/08/2024 10:18 AM   Katelyn [name] 22-Jun-1956 [id number]  Referring provider: Cleatus Arlyss RAMAN, MD [address] [place],  [place] [address]  Followup nephrolithiasis  HPI: [name] is a 67yo here for followup for nephrolithiasis. No stone events since last visit. No worsening LUTS. Renal US  shows bilateral hydronephrosis which is new. She has chronic bilateral low back pain. Mirabegron  25mg  every other day works well for her urinary frequency and urgency   PMH: Past Medical History:  Diagnosis Date   Anemia    Arthritis    Asthma    Headache(784.0)    cluster headaches, migraines- states is taking Atenolol  at present as directed per Dr Mercy  to prevent migraine from increased stress-  states if on medication for several weeks that heart rate will drop to 40's   History of kidney stones    HYPOTHYROIDISM 05/27/2007   Pneumonia    post op x 2 last yrs ago   PONV (postoperative nausea and vomiting)    likes phenergan  with anesthesia, has woken up with 2 surgeries per patient    WEIGHT GAIN 07/15/2009    Surgical History: Past Surgical History:  Procedure Laterality Date   ABDOMINAL HYSTERECTOMY     APPENDECTOMY     CYSTOSCOPY WITH RETROGRADE PYELOGRAM, URETEROSCOPY AND STENT PLACEMENT Right 04/26/2017   Procedure: CYSTOSCOPY WITH RETROGRADE PYELOGRAM, URETEROSCOPY AND STENT PLACEMENT;  Surgeon: Sherrilee Belvie CROME, MD;  Location: [facility];  Service: Urology;  Laterality: Right;   CYSTOSCOPY WITH RETROGRADE PYELOGRAM, URETEROSCOPY AND STENT PLACEMENT Right 08/19/2017   Procedure: CYSTOSCOPY WITH RETROGRADE PYELOGRAM, URETEROSCOPY;  Surgeon: Sherrilee Belvie CROME, MD;  Location: [facility];  Service: Urology;  Laterality: Right;   DIAGNOSTIC LAPAROSCOPY     FOOT SURGERY  2007   tumor removed from right foot/    HOLMIUM LASER APPLICATION Right 04/26/2017   Procedure: HOLMIUM LASER APPLICATION;  Surgeon: Sherrilee Belvie CROME, MD;  Location: [facility];   Service: Urology;  Laterality: Right;   KNEE ARTHROSCOPY Left    x 3   LAPAROTOMY  10/30/2011   Procedure: EXPLORATORY LAPAROTOMY;  Surgeon: Sari Bachelor, MD;  Location: [facility];  Service: Gynecology;  Laterality: N/A;   mortons neuroma removed from foot     SALPINGOOPHORECTOMY  10/30/2011   Procedure: SALPINGO OOPHERECTOMY;  Surgeon: Sari Bachelor, MD;  Location: [facility];  Service: Gynecology;  Laterality: Right;  RIGHT   SCAR DEBRIDEMENT OF TOTAL KNEE Left 11/24/2020   Procedure: Open excision of scar tissue of left total knee arthroplasty with polyethylene revision;  Surgeon: Ernie Cough, MD;  Location: [facility];  Service: Orthopedics;  Laterality: Left;  90 mins   TONSILLECTOMY     TOTAL KNEE ARTHROPLASTY Left 09/25/2017   Procedure: LEFT TOTAL KNEE ARTHROPLASTY;  Surgeon: Heide Ingle, MD;  Location: [facility];  Service: Orthopedics;  Laterality: Left;   TUBAL LIGATION     TYMPANOSTOMY TUBE PLACEMENT      Home Medications:  Allergies as of 05/08/2024       Reactions   Adhesive [tape] Swelling, Other (See Comments)   Skin rash   Ciprofloxacin Hives, Shortness Of Breath   Dimethicone Hives, Shortness Of Breath   Erythromycin Hives, Shortness Of Breath   Latex Hives, Shortness Of Breath   Methocarbamol  Hives   Other Hives, Shortness Of Breath, Other (See Comments)   Pt is allergic to nitrile gloves.  Pt is allergic to antibacterial soaps- rash - states she only uses [employer]  SCDs- patient states - had itching with this    Penicillins Hives, Shortness Of Breath, Other (See Comments)   Has patient had a PCN reaction causing immediate rash, facial/tongue/throat swelling, SOB or lightheadedness with hypotension: Yes Has patient had a PCN reaction causing severe rash involving mucus membranes or skin necrosis: No Has patient had a PCN reaction that required hospitalization No Has patient had a PCN reaction occurring within the last 10 years: No If all of the above answers are NO,  then may proceed with Cephalosporin use.   Pineapple Anaphylaxis   Red Dye #40 (allura Red) Hives, Shortness Of Breath   Shellfish Allergy Hives, Shortness Of Breath   Able to tolerate topical betadine .     Sulfonamide Derivatives Hives, Shortness Of Breath   Alcohol -sulfur [elemental Sulfur] Swelling, Other (See Comments)   Reaction:  Localized swelling  With alcohol    Aspirin  Nausea And Vomiting   Atorvastatin     myalgias   Doxycycline  Nausea And Vomiting   Hibiclens  [chlorhexidine ]    Rash    Ceftin  [cefuroxime ] Rash        Medication List        Accurate as of May 08, 2024 10:18 AM. If you have any questions, ask your nurse or doctor.          albuterol  108 (90 Base) MCG/ACT inhaler Commonly known as: VENTOLIN  HFA INHALE TWO PUFFS INTO THE LUNGS EVERY SIX HOURS AS NEEDED FOR WHEEZING OR SHORTNESS OF BREATH.   atenolol  25 MG tablet Commonly known as: TENORMIN  Take 1 tablet (25 mg total) by mouth daily as needed.   B-12 5000 MCG Caps Take 5,000 mcg by mouth daily.   Biotin 1 MG Caps Take 1 mg by mouth daily.   cyclobenzaprine  5 MG tablet Commonly known as: FLEXERIL  Take 1 tablet (5 mg total) by mouth 3 (three) times daily as needed for muscle spasms.   fluticasone  50 MCG/ACT nasal spray Commonly known as: FLONASE  Place 2 sprays into both nostrils daily.   ipratropium 0.03 % nasal spray Commonly known as: ATROVENT  Place 2 sprays into both nostrils every 12 (twelve) hours.   mirabegron  ER 25 MG Tb24 tablet Commonly known as: MYRBETRIQ  Take 1 tablet (25 mg total) by mouth every other day.   Synthroid  125 MCG tablet Generic drug: levothyroxine  Take 1 tablet (125 mcg total) by mouth daily. Except take 2 tablets 1 day a week.  Total of 8 pills/week.   tamsulosin  0.4 MG Caps capsule Commonly known as: FLOMAX  Take 1 capsule (0.4 mg total) by mouth daily as needed (kidney stones).   triamcinolone  cream 0.5 % Commonly known as: KENALOG  Apply 1 Application  topically 2 (two) times daily as needed.   Vitamin D3 50 MCG (2000 UT) capsule Take 2 capsules (4,000 Units total) by mouth daily.        Allergies:  Allergies  Allergen Reactions   Adhesive [Tape] Swelling and Other (See Comments)    Skin rash   Ciprofloxacin Hives and Shortness Of Breath   Dimethicone Hives and Shortness Of Breath   Erythromycin Hives and Shortness Of Breath   Latex Hives and Shortness Of Breath   Methocarbamol  Hives   Other Hives, Shortness Of Breath and Other (See Comments)    Pt is allergic to nitrile gloves.  Pt is allergic to antibacterial soaps- rash - states she only uses [employer] soap  SCDs- patient states - had itching with this    Penicillins Hives, Shortness Of Breath and Other (See  Comments)    Has patient had a PCN reaction causing immediate rash, facial/tongue/throat swelling, SOB or lightheadedness with hypotension: Yes Has patient had a PCN reaction causing severe rash involving mucus membranes or skin necrosis: No Has patient had a PCN reaction that required hospitalization No Has patient had a PCN reaction occurring within the last 10 years: No If all of the above answers are NO, then may proceed with Cephalosporin use.   Pineapple Anaphylaxis   Red Dye #40 (Allura Red) Hives and Shortness Of Breath   Shellfish Allergy Hives and Shortness Of Breath    Able to tolerate topical betadine .     Sulfonamide Derivatives Hives and Shortness Of Breath   Alcohol -Sulfur [Elemental Sulfur] Swelling and Other (See Comments)    Reaction:  Localized swelling  With alcohol     Aspirin  Nausea And Vomiting   Atorvastatin      myalgias   Doxycycline  Nausea And Vomiting   Hibiclens  [Chlorhexidine ]     Rash    Ceftin  [Cefuroxime ] Rash    Family History: Family History  Problem Relation Age of Onset   Thyroid  disease Mother    Alzheimer's disease Mother    Cervical cancer Mother    Thyroid  disease Father    Diabetes Sister    Heart attack Brother     Hypertension Neg Hx        family hx   Heart disease Neg Hx        family hx   Colon cancer Neg Hx    Breast cancer Neg Hx     Social History:  reports that she has never smoked. She has been exposed to tobacco smoke. She has never used smokeless tobacco. She reports that she does not drink alcohol  and does not use drugs.  ROS: All other review of systems were reviewed and are negative except what is noted above in HPI  Physical Exam: BP 135/70   Pulse 69   Constitutional:  Alert and oriented, No acute distress. HEENT: [place] AT, moist mucus membranes.  Trachea midline, no masses. Cardiovascular: No clubbing, cyanosis, or edema. Respiratory: Normal respiratory effort, no increased work of breathing. GI: Abdomen is soft, nontender, nondistended, no abdominal masses GU: No CVA tenderness.  Lymph: No cervical or inguinal lymphadenopathy. Skin: No rashes, bruises or suspicious lesions. Neurologic: Grossly intact, no focal deficits, moving all 4 extremities. Psychiatric: Normal mood and affect.  Laboratory Data: Lab Results  Component Value Date   WBC 8.6 10/18/2023   HGB 14.1 10/18/2023   HCT 44.0 10/18/2023   MCV 89 10/18/2023   PLT 356 10/18/2023    Lab Results  Component Value Date   CREATININE 1.00 10/18/2023    No results found for: PSA  No results found for: TESTOSTERONE  Lab Results  Component Value Date   HGBA1C 5.6 11/02/2020    Urinalysis    Component Value Date/Time   COLORURINE YELLOW 09/28/2017 0126   APPEARANCEUR Clear 05/10/2023 1248   LABSPEC 1.010 09/28/2017 0126   PHURINE 6.0 09/28/2017 0126   GLUCOSEU Negative 05/10/2023 1248   HGBUR MODERATE (A) 09/28/2017 0126   BILIRUBINUR Negative 05/10/2023 1248   KETONESUR 20 (A) 09/28/2017 0126   PROTEINUR Negative 05/10/2023 1248   PROTEINUR NEGATIVE 09/28/2017 0126   UROBILINOGEN 0.2 11/02/2020 1434   UROBILINOGEN 0.2 08/30/2011 2014   NITRITE Negative 05/10/2023 1248   NITRITE NEGATIVE  09/28/2017 0126   LEUKOCYTESUR Negative 05/10/2023 1248    Lab Results  Component Value Date   LABMICR See  below: 05/10/2023   WBCUA 0-5 05/10/2023   LABEPIT 0-10 05/10/2023   MUCUS Present 03/24/2021   BACTERIA None seen 05/10/2023    Pertinent Imaging: Renal US  04/24/2024: Images reviewed and discussed with the patient  Results for orders placed during the hospital encounter of 07/22/17  DG Abdomen 1 View  Narrative CLINICAL DATA:  Left flank pain and patient with a known history of urinary tract stones.  EXAM: ABDOMEN - 1 VIEW  COMPARISON:  CT abdomen and pelvis 04/23/2017.  FINDINGS: A subtle 0.5 cm density projecting in the mid pole of the right kidney may be a nonobstructing stone. No other evidence of urinary tract stone is identified. Punctate pelvic phlebolith is noted. The bowel gas pattern is unremarkable. No focal bony abnormality.  IMPRESSION: Possible 0.5 cm nonobstructing stone midpole right kidney. No other evidence of urinary tract stone.   Electronically Signed By: Debby Prader M.D. On: 07/22/2017 02:07  No results found for this or any previous visit.  No results found for this or any previous visit.  No results found for this or any previous visit.  Results for orders placed during the hospital encounter of 04/24/24  Ultrasound renal complete  Narrative CLINICAL DATA:  History of nephrolithiasis.  EXAM: RENAL / URINARY TRACT ULTRASOUND COMPLETE  COMPARISON:  May 03, 2023  FINDINGS: Right Kidney:  Renal measurements: 11.2 x 5.5 x 6.5 cm = volume: 214.3 mL. Echogenicity within normal limits. No mass visualized. Mild hydronephrosis.  Left Kidney:  Renal measurements: 11.8 x 6 x 5 cm = volume: 188.4 mL. Echogenicity within normal limits. No mass visualized. Moderate left hydronephrosis.  Bladder:  Appears normal for degree of bladder distention. Pre void volume 752 cc.  Other:  None.  IMPRESSION: Mild right and  moderate left hydronephrosis.   Electronically Signed By: Craig Farr M.D. On: 04/28/2024 15:08  No results found for this or any previous visit.  No results found for this or any previous visit.  Results for orders placed during the hospital encounter of 04/23/17  CT Renal Stone Study  Narrative CLINICAL DATA:  Acute onset of right flank pain. Leukocytosis. Red blood cells and white blood cells in the urine. Initial encounter.  EXAM: CT ABDOMEN AND PELVIS WITHOUT CONTRAST  TECHNIQUE: Multidetector CT imaging of the abdomen and pelvis was performed following the standard protocol without IV contrast.  COMPARISON:  CT of the abdomen and pelvis performed 01/08/2017, and pelvic ultrasound performed 08/31/2011  FINDINGS: Lower chest: Mild bibasilar atelectasis is noted. Calcification is seen at the mitral valve.  Hepatobiliary: There is diffuse fatty infiltration within the liver. The gallbladder is unremarkable in appearance. The common bile duct remains normal in size.  Pancreas: The pancreas is within normal limits.  Spleen: The spleen is unremarkable in appearance.  Adrenals/Urinary Tract: The adrenal glands are unremarkable in appearance.  Moderate right-sided hydronephrosis is noted, with an obstructing 7 x 6 mm stone at the proximal right ureter, just below the right renal pelvis. Right-sided perinephric stranding and fluid are noted.  Scattered nonobstructing bilateral renal stones are seen, larger on the right. Prominent left renal parapelvic cysts are noted.  Stomach/Bowel: The stomach is unremarkable in appearance. The small bowel is within normal limits. The appendix is normal in caliber, without evidence of appendicitis. The colon is unremarkable in appearance.  Vascular/Lymphatic: Scattered calcification is seen along the abdominal aorta and its branches. A retroaortic left renal vein is noted. The abdominal aorta is otherwise grossly  unremarkable. The inferior  vena cava is grossly unremarkable. No retroperitoneal lymphadenopathy is seen. No pelvic sidewall lymphadenopathy is identified.  Reproductive: The stomach is unremarkable in appearance. The small bowel is within normal limits. The patient is status post appendectomy. The colon is unremarkable in appearance.  Other: No additional soft tissue abnormalities are seen.  Musculoskeletal: No acute osseous abnormalities are identified. Mild vacuum phenomenon is noted at L5-S1. The visualized musculature is unremarkable in appearance.  IMPRESSION: 1. Moderate right-sided hydronephrosis, with an obstructing large 7 x 6 mm stone at the proximal right ureter, just below the right renal pelvis. 2. Nonobstructing bilateral renal stones, larger on the right. 3. Prominent left renal parapelvic cysts noted. 4. Scattered aortic atherosclerosis. 5. Mild bibasilar atelectasis noted. Calcification at the mitral valve. 6. Diffuse fatty infiltration within the liver.   Electronically Signed By: Juliane Chihuahua M.D. On: 04/23/2017 05:12   Assessment & Plan:    1. Nephrolithiasis (Primary) -followup 1 year with a renal US  - Urinalysis, Routine w reflex microscopic  2. Other hydronephrosis -CT stone study, will call with results  3. OAB -continue mirabegron  25mg  QOD   No follow-ups on file.  Belvie Clara, MD  [facility] [place]

## 2024-05-08 NOTE — Patient Instructions (Signed)
 Hydronephrosis  Hydronephrosis is the swelling of one or both kidneys due to a blockage that stops urine from flowing out of the body. Kidneys filter waste from the blood and produce urine. This condition can lead to kidney failure and may become life-threatening if not treated promptly. What are the causes? In infants and children, common causes include problems that occur when a baby is developing in the womb. These can include problems in the kidneys or in the tubes that drain urine into the bladder (ureters). In adults, common causes include: Kidney stones. Pregnancy. A tumor or cyst in the abdomen or pelvis. An enlarged prostate gland. Other causes include: Bladder infection. Scar tissue from a previous surgery or injury. A blood clot. Cancer of the prostate, bladder, uterus, ovary, or colon. What are the signs or symptoms? Symptoms of this condition include: Pain or discomfort in your side (flank) or abdomen. Swelling in your abdomen. Nausea and vomiting. Fever. Pain when passing urine. Feelings of urgency when you need to urinate. Urinating more often than normal. In some cases, you may not have any symptoms. How is this diagnosed? This condition may be diagnosed based on: Your symptoms and medical history. A physical exam. Blood and urine tests. Imaging tests, such as an ultrasound, CT scan, or MRI. A procedure to look at your urinary tract and bladder by inserting a scope into the urethra (cystoscopy). How is this treated? Treatment for this condition depends on where the blockage is, how long it has been there, and what caused it. The goal of treatment is to remove the blockage. Treatment may include: Antibiotic medicines to treat or prevent infection. A procedure to place a small, thin tube (stent) into a blocked ureter. The stent will keep the ureter open so that urine can drain through it. A nonsurgical procedure that crushes kidney stones with shock waves  (extracorporeal shock wave lithotripsy). If kidney failure occurs, treatment may include dialysis or a kidney transplant. Follow these instructions at home:  Take over-the-counter and prescription medicines only as told by your health care provider. If you were prescribed an antibiotic medicine, take it exactly as told by your health care provider. Do not stop taking the antibiotic even if you start to feel better. Rest and return to your normal activities as told by your health care provider. Ask your health care provider what activities are safe for you. Drink enough fluid to keep your urine pale yellow. Keep all follow-up visits. This is important. Contact a health care provider if: You continue to have symptoms after treatment. You develop new symptoms. Your urine becomes cloudy or bloody. You have a fever. Get help right away if: You have severe flank or abdominal pain. You cannot drink fluids without vomiting. Summary Hydronephrosis is the swelling of one or both kidneys due to a blockage that stops urine from flowing out of the body. Hydronephrosis can lead to kidney failure and may become life-threatening if not treated promptly. The goal of treatment is to remove the blockage. It may include a procedure to insert a stent into a blocked ureter, a procedure to break up kidney stones, or taking antibiotic medicines. Follow your health care provider's instructions for taking care of yourself at home, including instructions about drinking fluids, taking medicines, and limiting activities. This information is not intended to replace advice given to you by your health care provider. Make sure you discuss any questions you have with your health care provider. Document Revised: 07/30/2023 Document Reviewed: 07/30/2023 Elsevier  Patient Education  2024 [employer].

## 2024-05-12 ENCOUNTER — Ambulatory Visit: Payer: Self-pay | Admitting: Urology

## 2024-06-19 ENCOUNTER — Other Ambulatory Visit: Payer: Medicare Other

## 2024-10-01 ENCOUNTER — Ambulatory Visit (HOSPITAL_BASED_OUTPATIENT_CLINIC_OR_DEPARTMENT_OTHER)
Admission: RE | Admit: 2024-10-01 | Discharge: 2024-10-01 | Disposition: A | Discharge status: Home / Self Care | Source: Ambulatory Visit | Attending: Family Medicine | Admitting: Family Medicine

## 2024-10-01 DIAGNOSIS — Z780 Asymptomatic menopausal state: Secondary | ICD-10-CM | POA: Diagnosis present

## 2024-10-04 ENCOUNTER — Ambulatory Visit: Payer: Self-pay | Admitting: Family Medicine

## 2024-10-04 ENCOUNTER — Encounter: Payer: Self-pay | Admitting: Family Medicine

## 2024-10-04 DIAGNOSIS — M8580 Other specified disorders of bone density and structure, unspecified site: Secondary | ICD-10-CM | POA: Insufficient documentation

## 2024-12-18 ENCOUNTER — Other Ambulatory Visit: Payer: Self-pay | Admitting: Family Medicine

## 2024-12-18 NOTE — Telephone Encounter (Signed)
Please call patient to set up lab appointment

## 2024-12-18 NOTE — Telephone Encounter (Signed)
 lvm for pt to call office to schedule appt for labs

## 2024-12-21 ENCOUNTER — Other Ambulatory Visit

## 2025-04-30 ENCOUNTER — Other Ambulatory Visit (HOSPITAL_COMMUNITY)

## 2025-05-21 ENCOUNTER — Ambulatory Visit: Admitting: Urology
